# Patient Record
Sex: Male | Born: 1960
Health system: Southern US, Community
[De-identification: ages and names within clinical notes are randomized; demographics above are authoritative.]

## PROBLEM LIST (undated history)

## (undated) DIAGNOSIS — Z8619 Personal history of other infectious and parasitic diseases: Secondary | ICD-10-CM

## (undated) DIAGNOSIS — Z9989 Dependence on other enabling machines and devices: Secondary | ICD-10-CM

## (undated) DIAGNOSIS — T7840XA Allergy, unspecified, initial encounter: Secondary | ICD-10-CM

## (undated) DIAGNOSIS — I10 Essential (primary) hypertension: Secondary | ICD-10-CM

## (undated) DIAGNOSIS — N200 Calculus of kidney: Secondary | ICD-10-CM

## (undated) DIAGNOSIS — G4733 Obstructive sleep apnea (adult) (pediatric): Secondary | ICD-10-CM

## (undated) DIAGNOSIS — E785 Hyperlipidemia, unspecified: Secondary | ICD-10-CM

## (undated) HISTORY — PX: COLONOSCOPY: SHX174

## (undated) HISTORY — DX: Personal history of other infectious and parasitic diseases: Z86.19

## (undated) HISTORY — DX: Dependence on other enabling machines and devices: Z99.89

## (undated) HISTORY — PX: LITHOTRIPSY: SUR834

## (undated) HISTORY — DX: Allergy, unspecified, initial encounter: T78.40XA

## (undated) HISTORY — DX: Calculus of kidney: N20.0

## (undated) HISTORY — DX: Essential (primary) hypertension: I10

## (undated) HISTORY — DX: Obstructive sleep apnea (adult) (pediatric): G47.33

## (undated) HISTORY — DX: Hyperlipidemia, unspecified: E78.5

---

## 2006-04-17 ENCOUNTER — Emergency Department: Payer: Self-pay | Admitting: General Practice

## 2006-04-28 ENCOUNTER — Ambulatory Visit: Payer: Self-pay | Admitting: Urology

## 2006-04-28 ENCOUNTER — Other Ambulatory Visit: Payer: Self-pay

## 2006-08-17 ENCOUNTER — Ambulatory Visit: Payer: Self-pay

## 2006-09-26 ENCOUNTER — Ambulatory Visit: Payer: Self-pay | Admitting: Unknown Physician Specialty

## 2007-08-19 IMAGING — CT CT ABD-PELV W/O CM
1 of 2 series · 15 of 32 positions shown, 19 images · non-contrast
Comparison: none

REASON FOR EXAM: abdominal pain/stones
COMMENTS:  LMP: (Male)

[Series 2: stone · axial · 0.66mm/px · z∈[-490,-73]mm · 15 of 156 slices shown, 19 images]
[im 11/156  soft-tissue]
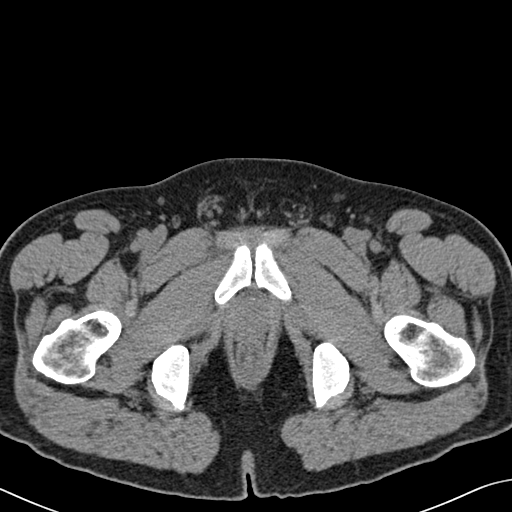
[im 11/156  bone]
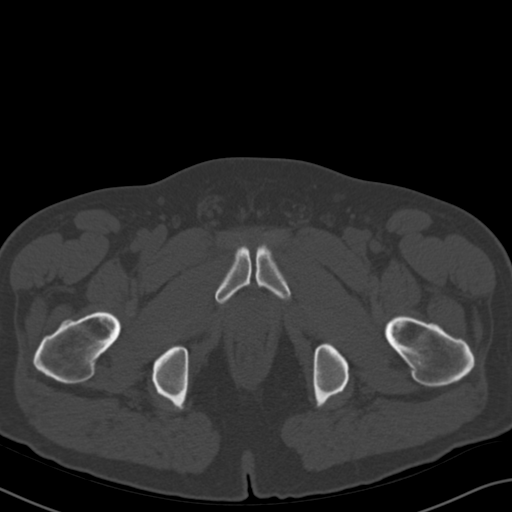
[im 22/156  soft-tissue]
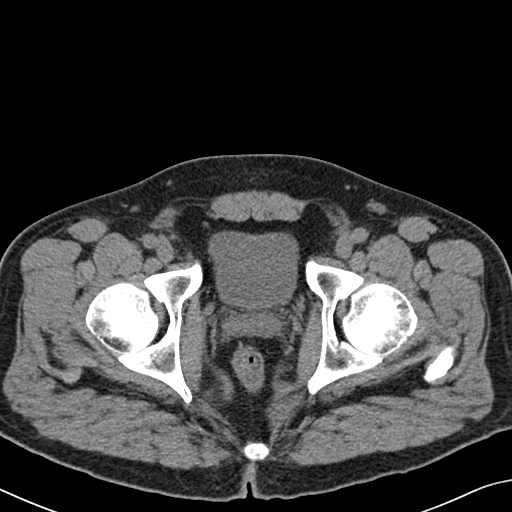
[im 33/156  soft-tissue]
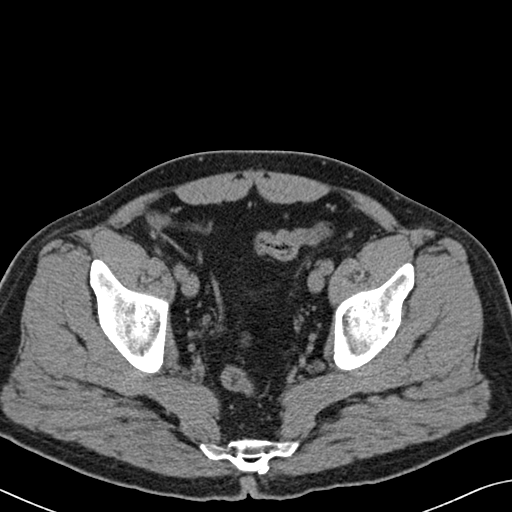
[im 43/156  soft-tissue]
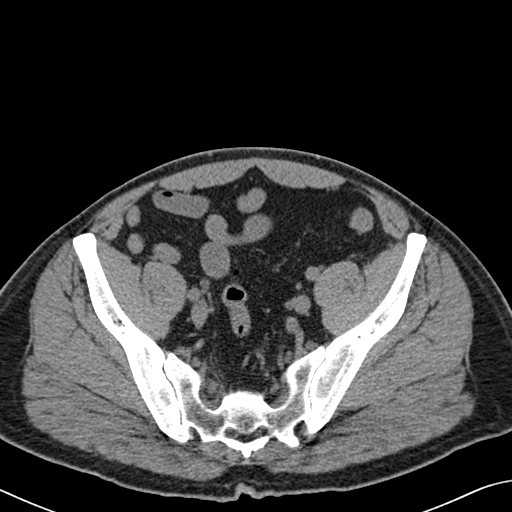
[im 54/156  soft-tissue]
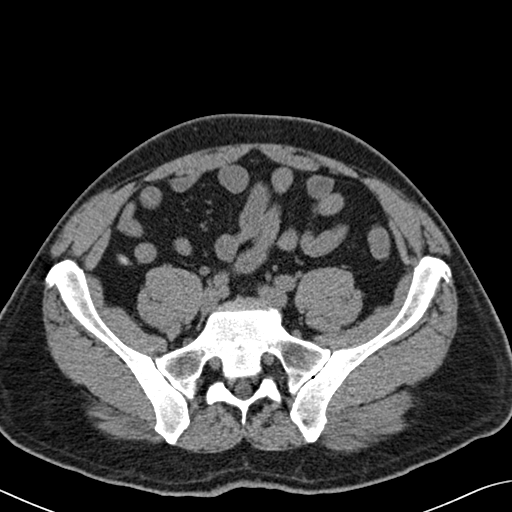
[im 65/156  soft-tissue]
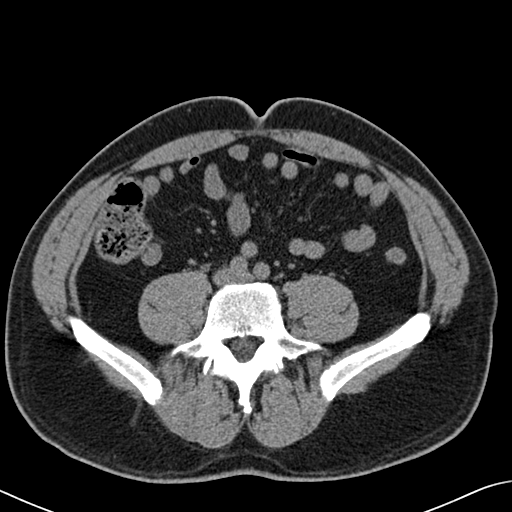
[im 81/156  soft-tissue]
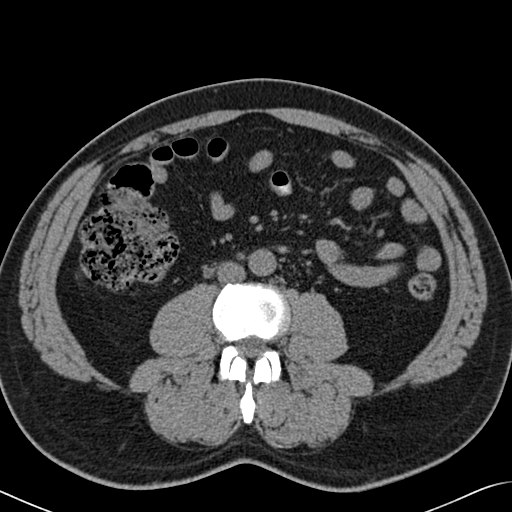
[im 91/156  soft-tissue]
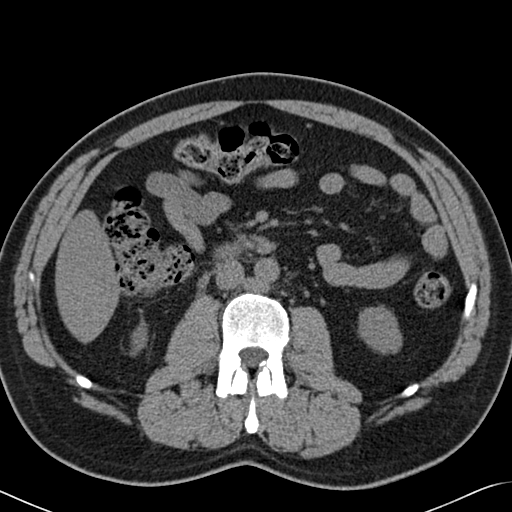
[im 102/156  soft-tissue]
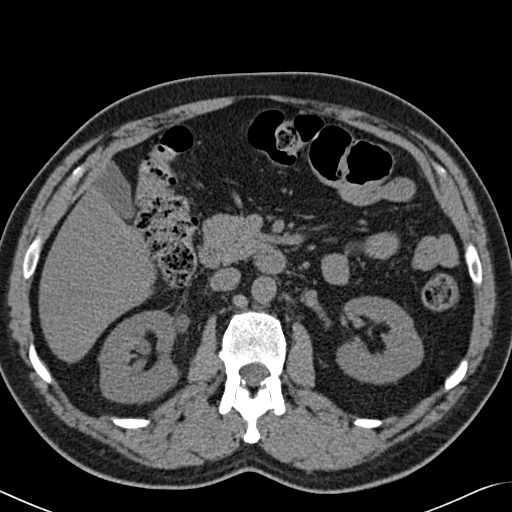
[im 102/156  bone]
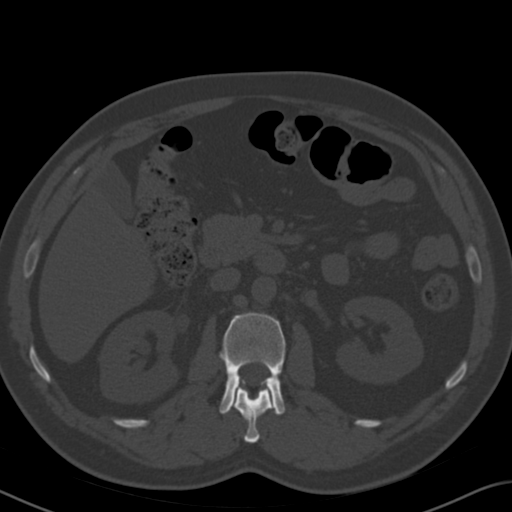
[im 113/156  soft-tissue]
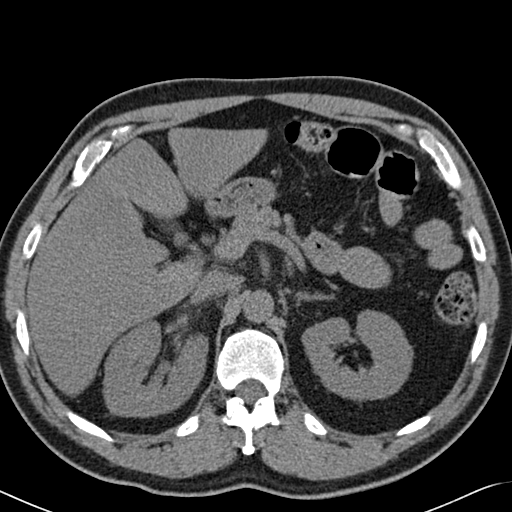
[im 123/156  soft-tissue]
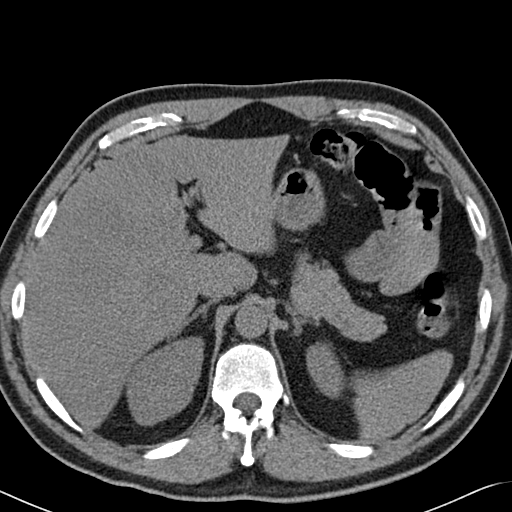
[im 134/156  soft-tissue]
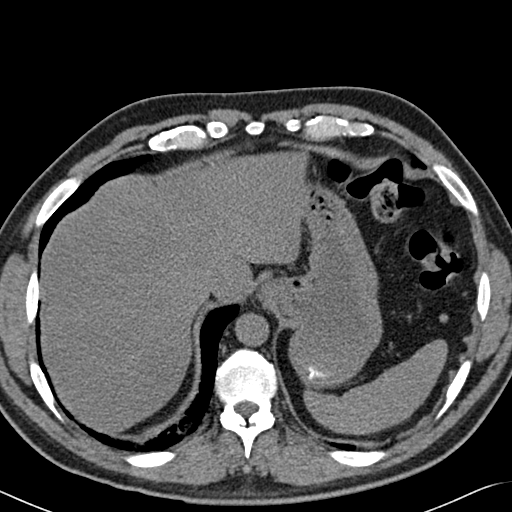
[im 134/156  lung]
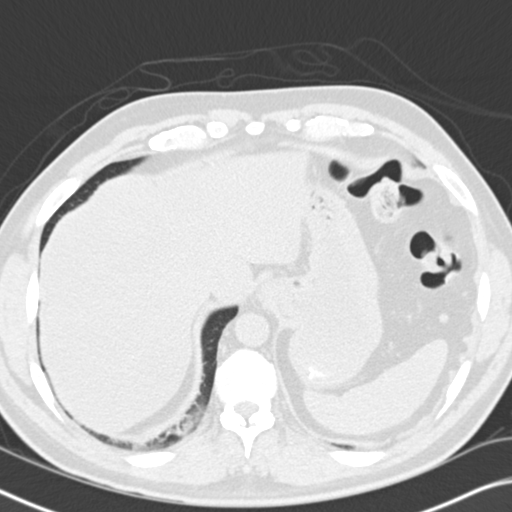
[im 139/156  lung]
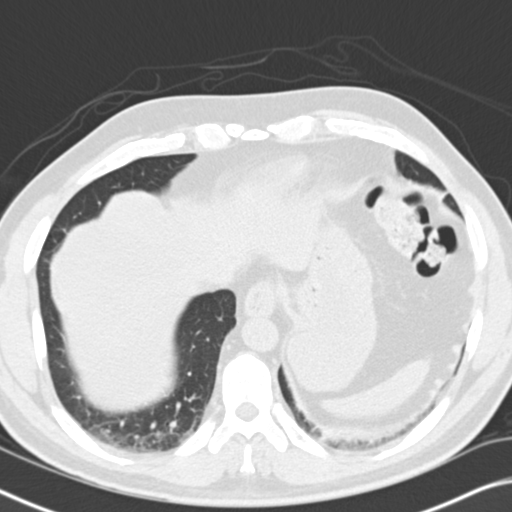
[im 145/156  soft-tissue]
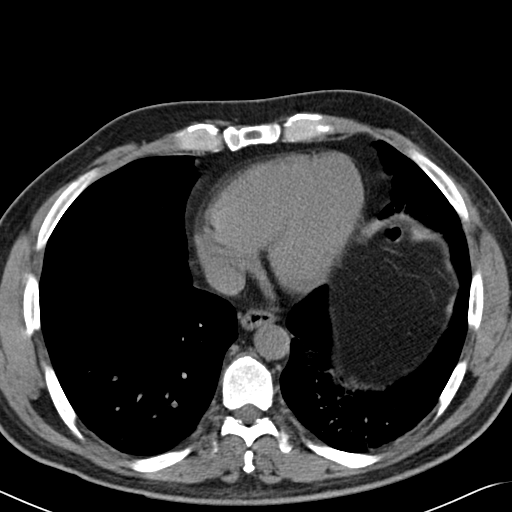
[im 145/156  lung]
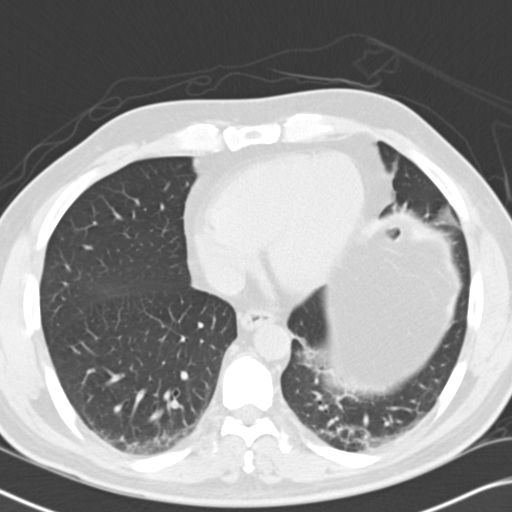
[im 150/156  lung]
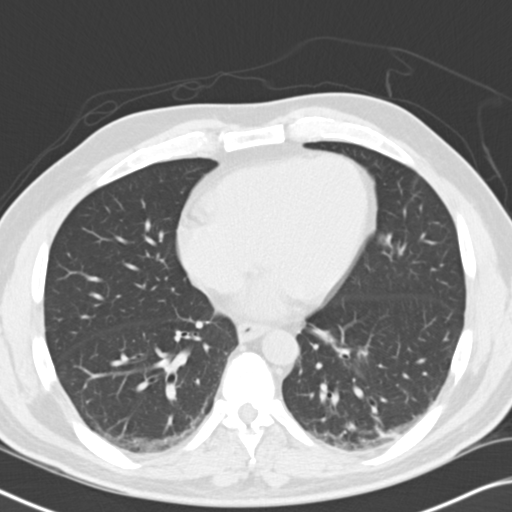

[15 of 32 positions shown; findings below may reference images not displayed]

PROCEDURE:     CT  - CT ABDOMEN AND PELVIS W[DATE] [DATE]

RESULT:          Spiral 3 mm sections were obtained from the lung bases
through the pubic symphysis without the intravenous administration of oral
or intravenous contrast.

Atelectasis versus infiltrate is appreciated within the lung bases, LEFT
greater than RIGHT.

Evaluation of the RIGHT kidney demonstrates mild hydronephrosis as well as
mild hydroureter.  There is minimal inflammatory change within the
periureteral fat.  Within the distal ureter at the level of the acetabulum,
a 4.1 mm calculus is appreciated within the ureter.  This is proximal to the
ureterovesical junction.  Evaluation of the RIGHT kidney otherwise
demonstrates no further masses nor calculi.  Evaluation of the LEFT kidney
demonstrates a nonobstructing calculus within the posterior upper pole
region measuring 4.0 mm in diameter.  There is no evidence of LEFT
hydronephrosis nor hydroureter nor ureterolithiasis.

The liver, spleen, adrenals, and pancreas are unremarkable.  There is no
evidence of abdominal or pelvic free fluid, drainable loculated fluid
collections, masses nor adenopathy.
IMPRESSION: 1.     Distal RIGHT ureteral calculus as described above with associated
mild obstructive uropathy.
2.     Nonobstructing calculus involving the LEFT kidney.
3.     Dr. Noushin, of the emergency department, was informed of these
findings at the time of the initial interpretation.

## 2008-04-13 ENCOUNTER — Ambulatory Visit: Payer: Self-pay | Admitting: Family Medicine

## 2008-04-18 ENCOUNTER — Ambulatory Visit: Payer: Self-pay | Admitting: Urology

## 2009-08-15 IMAGING — CT CT STONE STUDY
1 of 2 series · 15 of 32 positions shown, 19 images · non-contrast
Comparison: none

REASON FOR EXAM: left flank/groin pain
COMMENTS:

[Series 2: stone · axial · 0.73mm/px · z∈[-527,-77]mm · 15 of 164 slices shown, 19 images]
[im 7/164  soft-tissue]
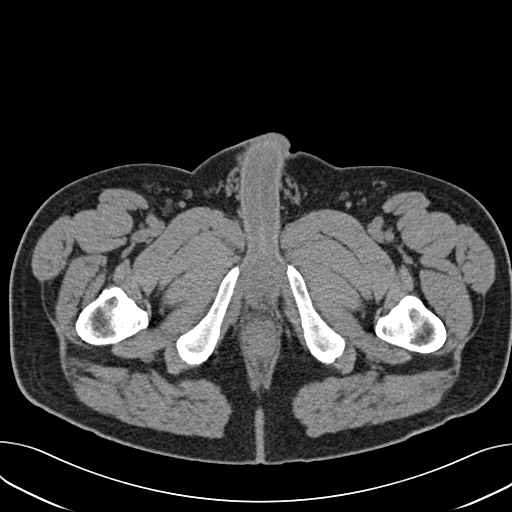
[im 7/164  bone]
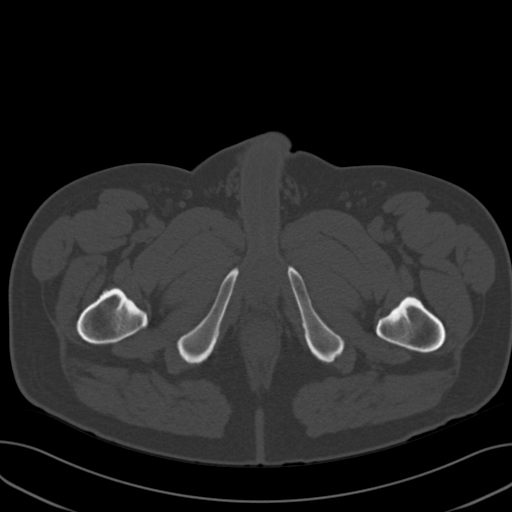
[im 19/164  soft-tissue]
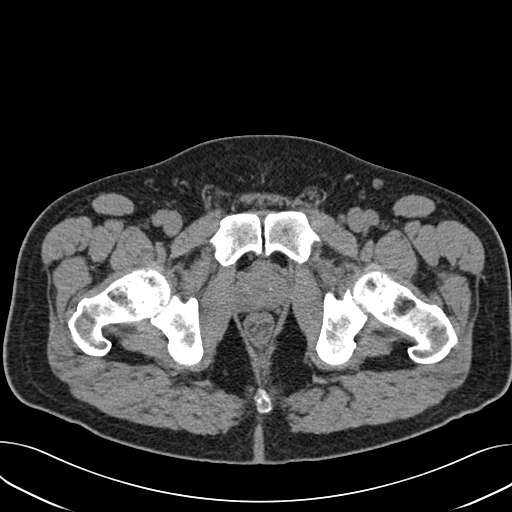
[im 32/164  soft-tissue]
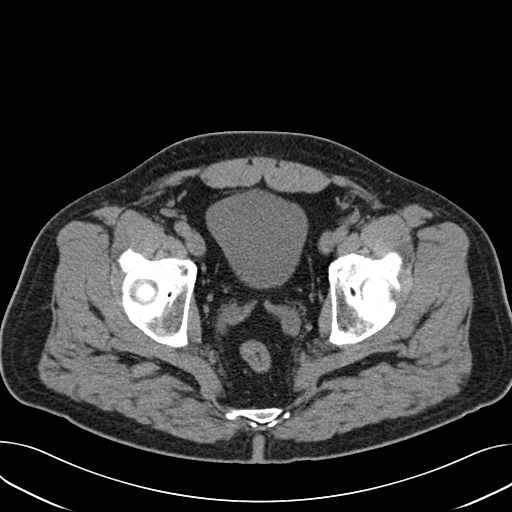
[im 44/164  soft-tissue]
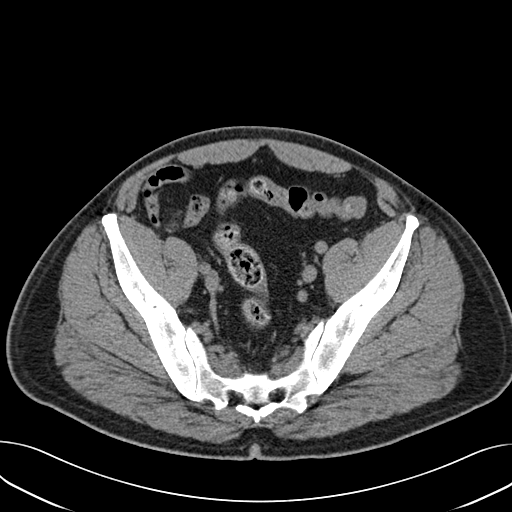
[im 57/164  soft-tissue]
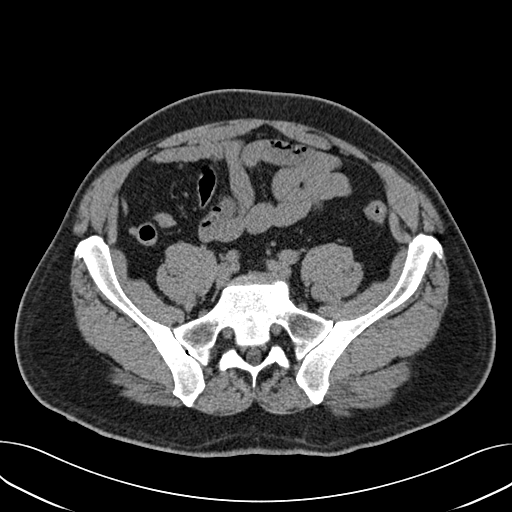
[im 69/164  soft-tissue]
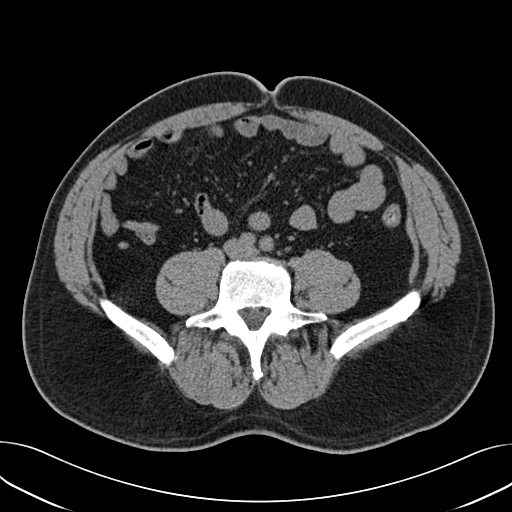
[im 82/164  soft-tissue]
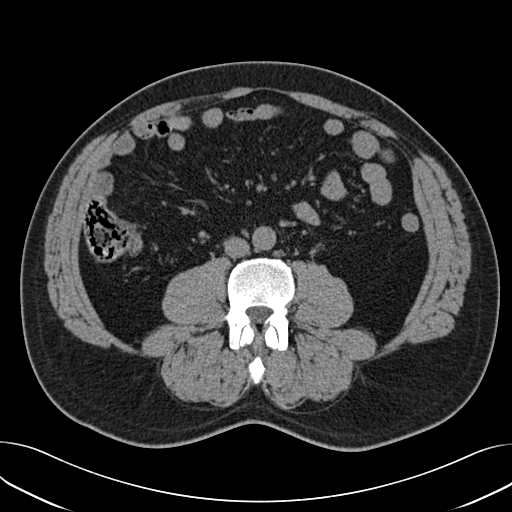
[im 95/164  soft-tissue]
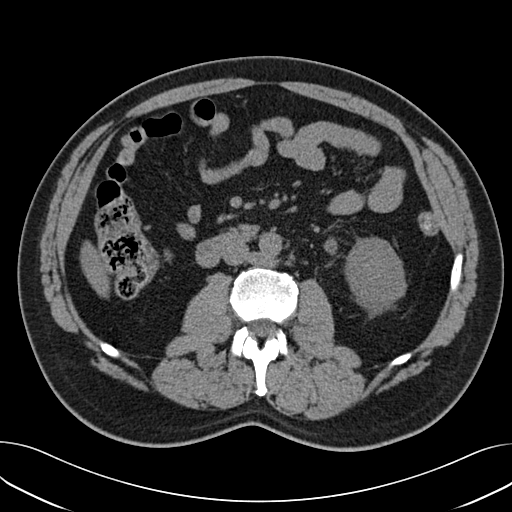
[im 107/164  soft-tissue]
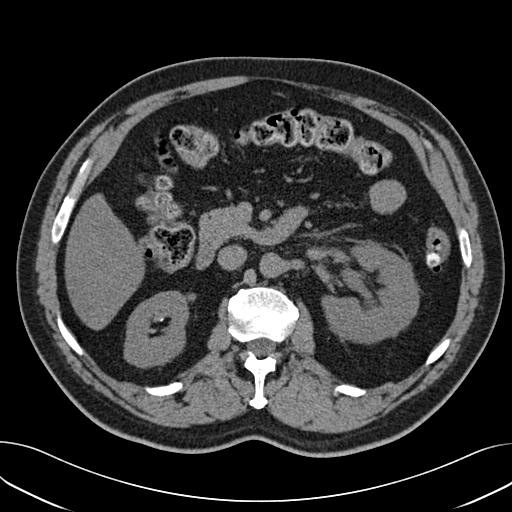
[im 107/164  bone]
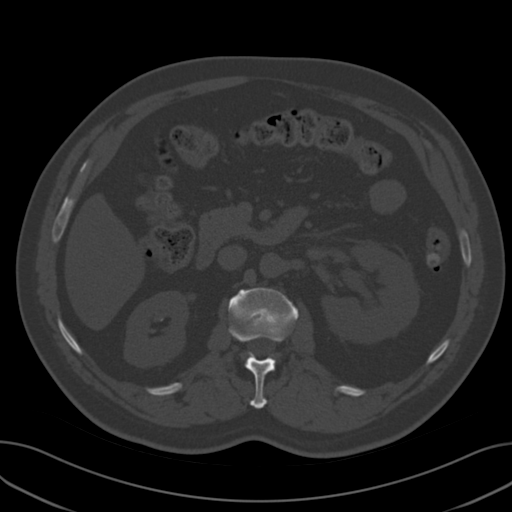
[im 120/164  soft-tissue]
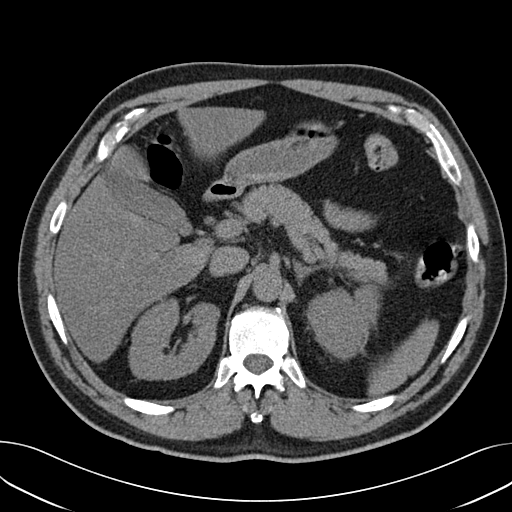
[im 132/164  soft-tissue]
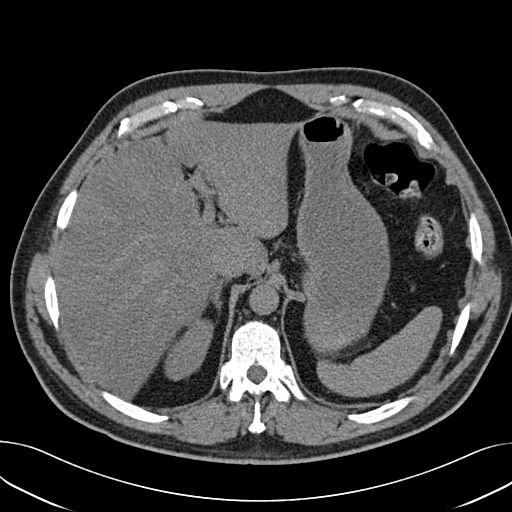
[im 138/164  lung]
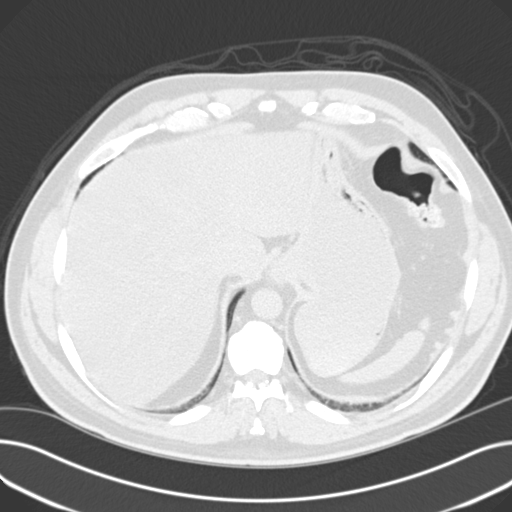
[im 145/164  soft-tissue]
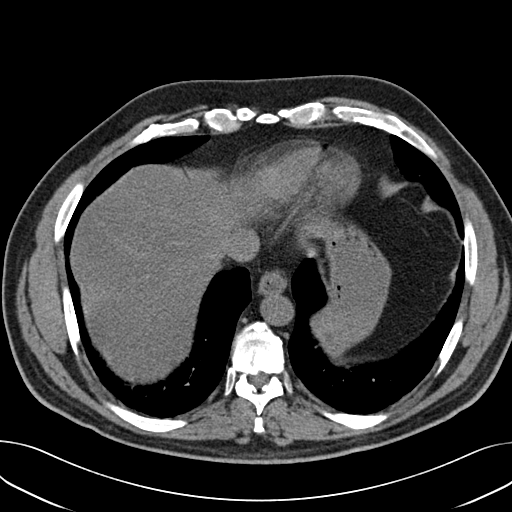
[im 145/164  lung]
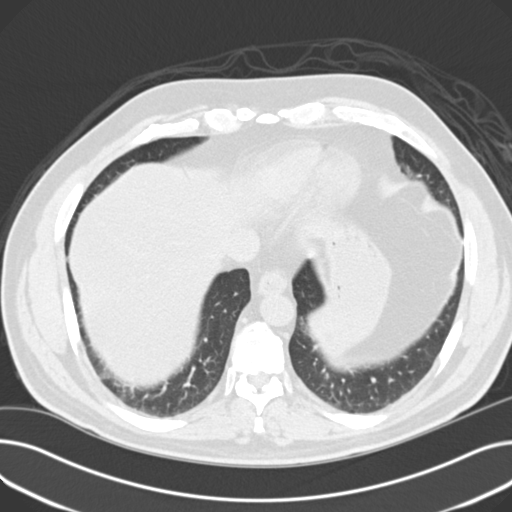
[im 151/164  lung]
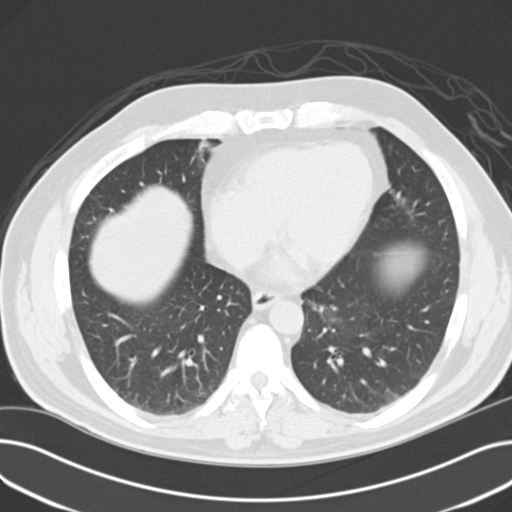
[im 157/164  soft-tissue]
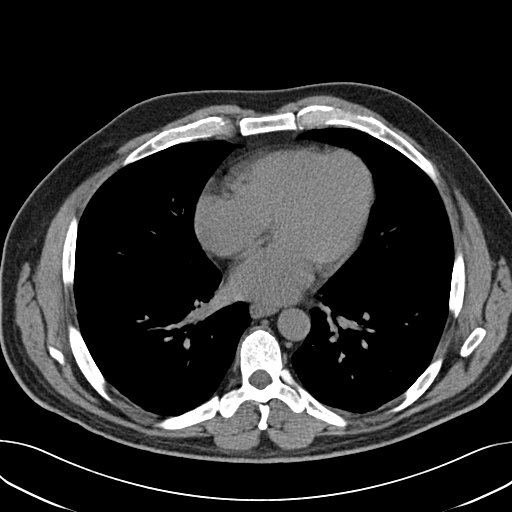
[im 157/164  lung]
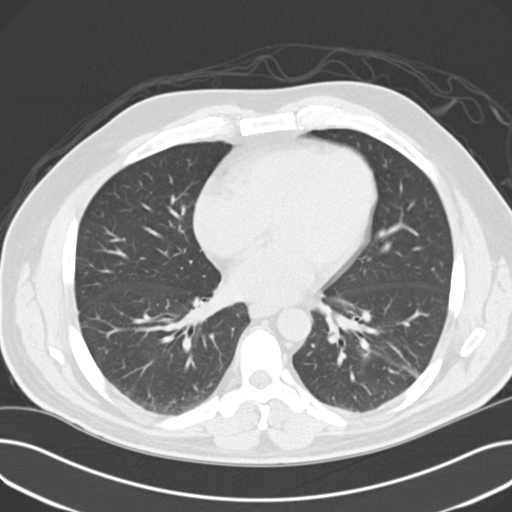

[15 of 32 positions shown; findings below may reference images not displayed]

PROCEDURE:     CT  - CT ABDOMEN /PELVIS WO (STONE)  - April 13, 2008 [DATE]

RESULT:     Noncontrast renal stone protocol CT of the abdomen and pelvis
demonstrates a left-sided stone measuring approximately 6 mm x 4 mm on image
74 in the proximal left ureter with mild to moderate left hydronephrosis and
perinephric stranding. No additional left renal calculi are evident. There
is a tiny stone of 2 mm in the lower pole of the right kidney on image 58.
The previously demonstrated distal right ureteral stone has resolved. The
upper pole stone previously seen on the left is not present. No gallstones
are evident. The abdominal viscera appear to be unremarkable. There is no
adenopathy, free air, free fluid or inflammatory stranding elsewhere. The
urinary bladder is unremarkable. The aorta is normal in caliber with
scattered minimal atherosclerotic calcification.
IMPRESSION: 1. Obstructing proximal left ureteral stone of to 6 mm size. Urologic
followup is recommended.
2. Nonobstructing lower pole 2 mm right renal stone.

## 2009-12-29 ENCOUNTER — Ambulatory Visit: Payer: Self-pay | Admitting: Family Medicine

## 2010-12-15 ENCOUNTER — Emergency Department: Payer: Self-pay | Admitting: Emergency Medicine

## 2011-02-18 ENCOUNTER — Ambulatory Visit: Payer: Self-pay | Admitting: Gastroenterology

## 2015-01-01 ENCOUNTER — Ambulatory Visit: Payer: Self-pay | Admitting: Urology

## 2015-01-02 ENCOUNTER — Ambulatory Visit: Payer: Self-pay | Admitting: Urology

## 2015-04-23 ENCOUNTER — Encounter: Payer: Self-pay | Admitting: Family Medicine

## 2015-04-23 ENCOUNTER — Ambulatory Visit (INDEPENDENT_AMBULATORY_CARE_PROVIDER_SITE_OTHER): Payer: 59 | Admitting: Family Medicine

## 2015-04-23 VITALS — BP 139/90 | HR 75 | Temp 98.3°F | Wt 199.0 lb

## 2015-04-23 DIAGNOSIS — I10 Essential (primary) hypertension: Secondary | ICD-10-CM | POA: Diagnosis not present

## 2015-04-23 DIAGNOSIS — J320 Chronic maxillary sinusitis: Secondary | ICD-10-CM | POA: Diagnosis not present

## 2015-04-23 DIAGNOSIS — T7840XA Allergy, unspecified, initial encounter: Secondary | ICD-10-CM | POA: Insufficient documentation

## 2015-04-23 DIAGNOSIS — N200 Calculus of kidney: Secondary | ICD-10-CM | POA: Insufficient documentation

## 2015-04-23 DIAGNOSIS — E785 Hyperlipidemia, unspecified: Secondary | ICD-10-CM | POA: Insufficient documentation

## 2015-04-23 DIAGNOSIS — G4733 Obstructive sleep apnea (adult) (pediatric): Secondary | ICD-10-CM | POA: Insufficient documentation

## 2015-04-23 DIAGNOSIS — Z9989 Dependence on other enabling machines and devices: Secondary | ICD-10-CM

## 2015-04-23 MED ORDER — AMOXICILLIN-POT CLAVULANATE 875-125 MG PO TABS: 1.0000 | ORAL_TABLET | Freq: Two times a day (BID) | ORAL | Status: DC

## 2015-04-23 NOTE — Patient Instructions (Addendum)
Try turmeric as a natural anti-inflammatory (for pain and arthritis). It comes in capsules where you buy aspirin and fish oil, but also as a spice where you buy pepper and garlic powder. Rest and hydration and vitamin C and green tea Start the antibiotic if you develop fever, foul purulent drainage, and/or get much worse Nasal saline rinses Continue plain Mucinex or Mucinex DM Try to use PLAIN allergy medicine without the decongestant Avoid: phenylephrine, phenylpropanolamine, and pseudoephredine   DASH Eating Plan DASH stands for "Dietary Approaches to Stop Hypertension." The DASH eating plan is a healthy eating plan that has been shown to reduce high blood pressure (hypertension). Additional health benefits may include reducing the risk of type 2 diabetes mellitus, heart disease, and stroke. The DASH eating plan may also help with weight loss. WHAT DO I NEED TO KNOW ABOUT THE DASH EATING PLAN? For the DASH eating plan, you will follow these general guidelines:  Choose foods with a percent daily value for sodium of less than 5% (as listed on the food label).  Use salt-free seasonings or herbs instead of table salt or sea salt.  Check with your health care provider or pharmacist before using salt substitutes.  Eat lower-sodium products, often labeled as "lower sodium" or "no salt added."  Eat fresh foods.  Eat more vegetables, fruits, and low-fat dairy products.  Choose whole grains. Look for the word "whole" as the first word in the ingredient list.  Choose fish and skinless chicken or [place] more often than red meat. Limit fish, poultry, and meat to 6 oz (170 g) each day.  Limit sweets, desserts, sugars, and sugary drinks.  Choose heart-healthy fats.  Limit cheese to 1 oz (28 g) per day.  Eat more home-cooked food and less restaurant, buffet, and fast food.  Limit fried foods.  Cook foods using methods other than frying.  Limit canned vegetables. If you do use them, rinse  them well to decrease the sodium.  When eating at a restaurant, ask that your food be prepared with less salt, or no salt if possible. WHAT FOODS CAN I EAT? Seek help from a dietitian for individual calorie needs. Grains Whole grain or whole wheat bread. Brown rice. Whole grain or whole wheat pasta. Quinoa, bulgur, and whole grain cereals. Low-sodium cereals. Corn or whole wheat flour tortillas. Whole grain cornbread. Whole grain crackers. Low-sodium crackers. Vegetables Fresh or frozen vegetables (raw, steamed, roasted, or grilled). Low-sodium or reduced-sodium tomato and vegetable juices. Low-sodium or reduced-sodium tomato sauce and paste. Low-sodium or reduced-sodium canned vegetables.  Fruits All fresh, canned (in natural juice), or frozen fruits. Meat and Other Protein Products Ground beef (85% or leaner), grass-fed beef, or beef trimmed of fat. Skinless chicken or [place]. Ground chicken or [place]. Pork trimmed of fat. All fish and seafood. Eggs. Dried beans, peas, or lentils. Unsalted nuts and seeds. Unsalted canned beans. Dairy Low-fat dairy products, such as skim or 1% milk, 2% or reduced-fat cheeses, low-fat ricotta or cottage cheese, or plain low-fat yogurt. Low-sodium or reduced-sodium cheeses. Fats and Oils Tub margarines without trans fats. Light or reduced-fat mayonnaise and salad dressings (reduced sodium). Avocado. Safflower, olive, or canola oils. Natural peanut or almond butter. Other Unsalted popcorn and pretzels. The items listed above may not be a complete list of recommended foods or beverages. Contact your dietitian for more options. WHAT FOODS ARE NOT RECOMMENDED? Grains White bread. White pasta. White rice. Refined cornbread. Bagels and croissants. Crackers that contain trans fat. Vegetables Creamed or  fried vegetables. Vegetables in a cheese sauce. Regular canned vegetables. Regular canned tomato sauce and paste. Regular tomato and vegetable juices. Fruits Dried  fruits. Canned fruit in light or heavy syrup. Fruit juice. Meat and Other Protein Products Fatty cuts of meat. Ribs, chicken wings, bacon, sausage, bologna, salami, chitterlings, fatback, hot dogs, bratwurst, and packaged luncheon meats. Salted nuts and seeds. Canned beans with salt. Dairy Whole or 2% milk, cream, half-and-half, and cream cheese. Whole-fat or sweetened yogurt. Full-fat cheeses or blue cheese. Nondairy creamers and whipped toppings. Processed cheese, cheese spreads, or cheese curds. Condiments Onion and garlic salt, seasoned salt, table salt, and sea salt. Canned and packaged gravies. Worcestershire sauce. Tartar sauce. Barbecue sauce. Teriyaki sauce. Soy sauce, including reduced sodium. Steak sauce. Fish sauce. Oyster sauce. Cocktail sauce. Horseradish. Ketchup and mustard. Meat flavorings and tenderizers. Bouillon cubes. Hot sauce. Tabasco sauce. Marinades. Taco seasonings. Relishes. Fats and Oils Butter, stick margarine, lard, shortening, ghee, and bacon fat. Coconut, palm kernel, or palm oils. Regular salad dressings. Other Pickles and olives. Salted popcorn and pretzels. The items listed above may not be a complete list of foods and beverages to avoid. Contact your dietitian for more information. WHERE CAN I FIND MORE INFORMATION? National [employer], [employer], [employer]: travelstabloid.com Document Released: 10/14/2011 Document Revised: 03/11/2014 Document Reviewed: 08/29/2013 [facility] Patient Information 2015 [place], [place]. This information is not intended to replace advice given to you by your health care provider. Make sure you discuss any questions you have with your health care provider.

## 2015-04-23 NOTE — Progress Notes (Signed)
BP 139/90 mmHg  Pulse 75  Temp(Src) 98.3 F (36.8 C)  Wt 199 lb (90.266 kg)  SpO2 99%   Subjective:    Patient ID: Craig Ryan, male    DOB: [birth year], 54 y.o.   MRN: [id number]  HPI: Craig Ryan is a 54 y.o. male  Chief Complaint  Patient presents with  . URI    He started to have symptoms 1 week ago Congested and runny nose The last two nights, if he lays down, then he has constant coughing Very little sleep the last two nights Some of the discharge he has yellowish tint Slight warm temperature, not sure if outright fever though Taking tylenol and mucinex  His wife started with similar symptoms 1-1/2 weeks ago; she saw someone and went to walk-in clinic; was put on antibiotic and she's doing great He is leaving for [place] in the morning He has high blood pressure; he has been taking Mucinex DM altered with Mucinex D  Relevant past medical, surgical, family and social history reviewed and updated as indicated. Interim medical history since our last visit reviewed. Allergies and medications reviewed and updated.  Review of Systems  Constitutional: Negative for fever.  HENT: Positive for congestion, ear pain (almost feels like the right ear has something in there but nothing there; going on for years off and on, when bothered with allergies), postnasal drip, rhinorrhea and sore throat (just a little irritation from drainage). Negative for dental problem, ear discharge, facial swelling, mouth sores, nosebleeds, sinus pressure, tinnitus and trouble swallowing.        Wears mouth guard at night for grinding; no tenderness over the TMJ  Musculoskeletal: Positive for arthralgias (little stiffness in the hands).  Skin: Negative for rash.  Neurological: Positive for headaches (occasionally, nothing out of the ordinary).    Per HPI unless specifically indicated above     Objective:    BP 139/90 mmHg  Pulse 75  Temp(Src) 98.3 F (36.8 C)  Wt 199 lb (90.266  kg)  SpO2 99%  Wt Readings from Last 3 Encounters:  04/23/15 199 lb (90.266 kg)  02/26/15 198 lb (89.812 kg)    Physical Exam  Constitutional: He appears well-developed and well-nourished. No distress.  HENT:  Head: Normocephalic and atraumatic.  Right Ear: External ear normal. No drainage, swelling or tenderness. No mastoid tenderness. Tympanic membrane is not injected, not scarred, not perforated, not erythematous, not retracted and not bulging. No middle ear effusion. No hemotympanum. No decreased hearing is noted.  Left Ear: External ear normal. No drainage, swelling or tenderness. No foreign bodies. No mastoid tenderness. Tympanic membrane is not injected, not scarred, not perforated, not erythematous, not retracted and not bulging.  No middle ear effusion. No hemotympanum. No decreased hearing is noted.  Nose: Sinus tenderness (over the right maxillary paranasal sinus region) present. No mucosal edema, rhinorrhea, nasal deformity or septal deviation. Right sinus exhibits maxillary sinus tenderness. Right sinus exhibits no frontal sinus tenderness. Left sinus exhibits no maxillary sinus tenderness and no frontal sinus tenderness.  Mouth/Throat: Mucous membranes are normal.  No tenderness or subluxation of the TMJ joints; scant cerumen with fibers deep in the canals, tiny and not obscuring visualization of the TMs  Eyes: EOM are normal. Right eye exhibits no discharge. Left eye exhibits no discharge. Right conjunctiva is not injected. Left conjunctiva is not injected. No scleral icterus.  Neck: No JVD present.  Cardiovascular: Normal rate, regular rhythm and normal heart sounds.   No  murmur heard. Pulmonary/Chest: Effort normal. No respiratory distress.  Lymphadenopathy:       Head (right side): No submental, no submandibular, no preauricular and no posterior auricular adenopathy present.       Head (left side): No submental, no submandibular, no preauricular and no posterior auricular  adenopathy present.    He has no cervical adenopathy.  Neurological: He is alert. He displays no tremor.  Skin: Skin is warm and dry. No rash noted. He is not diaphoretic. No cyanosis.  Psychiatric: He has a normal mood and affect. His behavior is normal. Judgment and thought content normal.  Nursing note and vitals reviewed.   No results found for this or any previous visit.    Assessment & Plan:   Problem List Items Addressed This Visit      Cardiovascular and Mediastinum   Hypertension    Not quite to goal; suggested that he NOT use the Mucinex D and instead use either plain or Mucinex DM; avoid decongestants; recommended the DASH guidelines, hand-out given (see wrap-up); he will see his primary in a few months for follow-up; if he can adhere to DASH guidelines, may be able to reduce current medicine, talk with primary      Relevant Medications   metoprolol succinate (TOPROL-XL) 50 MG 24 hr tablet    Other Visit Diagnoses    Right maxillary sinusitis    -  Primary    advised early, may not need antibiotics; discussed risk of C diff, trial of rest, hydration, vit C, green tea, nasal saline spray; Rx to start if needed    Relevant Medications    amoxicillin-clavulanate (AUGMENTIN) 875-125 MG per tablet     (he was told by his primary he is getting older, may have some arthritis; I suggested turmeric for joints; just mentioned in passing)  Follow up plan: Return if symptoms worsen or fail to improve.

## 2015-04-23 NOTE — Assessment & Plan Note (Signed)
Not quite to goal; suggested that he NOT use the Mucinex D and instead use either plain or Mucinex DM; avoid decongestants; recommended the DASH guidelines, hand-out given (see wrap-up); he will see his primary in a few months for follow-up; if he can adhere to DASH guidelines, may be able to reduce current medicine, talk with primary

## 2015-07-29 ENCOUNTER — Other Ambulatory Visit: Payer: Self-pay | Admitting: Family Medicine

## 2015-08-25 ENCOUNTER — Encounter: Payer: Self-pay | Admitting: Family Medicine

## 2015-08-25 ENCOUNTER — Ambulatory Visit (INDEPENDENT_AMBULATORY_CARE_PROVIDER_SITE_OTHER): Payer: Commercial Managed Care - HMO | Admitting: Family Medicine

## 2015-08-25 VITALS — BP 131/85 | HR 67 | Temp 99.0°F | Ht 68.8 in | Wt 204.0 lb

## 2015-08-25 DIAGNOSIS — G4733 Obstructive sleep apnea (adult) (pediatric): Secondary | ICD-10-CM | POA: Diagnosis not present

## 2015-08-25 DIAGNOSIS — I10 Essential (primary) hypertension: Secondary | ICD-10-CM

## 2015-08-25 DIAGNOSIS — E785 Hyperlipidemia, unspecified: Secondary | ICD-10-CM | POA: Diagnosis not present

## 2015-08-25 DIAGNOSIS — Z9989 Dependence on other enabling machines and devices: Secondary | ICD-10-CM

## 2015-08-25 DIAGNOSIS — N200 Calculus of kidney: Secondary | ICD-10-CM

## 2015-08-25 LAB — LP+ALT+AST PICCOLO, WAIVED
ALT (SGPT) PICCOLO, WAIVED: 35 U/L (ref 10–47)
AST (SGOT) Piccolo, Waived: 33 U/L (ref 11–38)
Chol/HDL Ratio Piccolo,Waive: 3 mg/dL
Cholesterol Piccolo, Waived: 159 mg/dL (ref <200)
HDL Chol Piccolo, Waived: 54 mg/dL — ABNORMAL LOW (ref >59)
LDL CHOL CALC PICCOLO WAIVED: 87 mg/dL (ref <100)
Triglycerides Piccolo,Waived: 92 mg/dL (ref <150)
VLDL CHOL CALC PICCOLO,WAIVE: 18 mg/dL (ref <30)

## 2015-08-25 NOTE — Progress Notes (Signed)
   BP 131/85 mmHg  Pulse 67  Temp(Src) 99 F (37.2 C)  Ht 5' 8.8" (1.748 m)  Wt 204 lb (92.534 kg)  BMI 30.28 kg/m2  SpO2 99%   Subjective:    Patient ID: Craig Ryan, male    DOB: 11-Nov-1960, 54 y.o.   MRN: [id number]  HPI: Craig Ryan is a 54 y.o. male  Chief Complaint  Patient presents with  . Hyperlipidemia  . Hypertension   patient follow-up hypertension doing well no complaints from medications taken faithfully blood pressure doing well. Patient's cholesterol doing well no complaints from Lipitor taking faithfully no side effects Has passed some kidney stones hasn't had any symptoms since otherwise doing well trying to stay hydrated to prevent Kidney stone.  Relevant past medical, surgical, family and social history reviewed and updated as indicated. Interim medical history since our last visit reviewed. Allergies and medications reviewed and updated.  Review of Systems  Constitutional: Negative.   Respiratory: Negative.   Cardiovascular: Negative.     Per HPI unless specifically indicated above     Objective:    BP 131/85 mmHg  Pulse 67  Temp(Src) 99 F (37.2 C)  Ht 5' 8.8" (1.748 m)  Wt 204 lb (92.534 kg)  BMI 30.28 kg/m2  SpO2 99%  Wt Readings from Last 3 Encounters:  08/25/15 204 lb (92.534 kg)  04/23/15 199 lb (90.266 kg)  02/26/15 198 lb (89.812 kg)    Physical Exam  Constitutional: He is oriented to person, place, and time. He appears well-developed and well-nourished. No distress.  HENT:  Head: Normocephalic and atraumatic.  Right Ear: Hearing normal.  Left Ear: Hearing normal.  Nose: Nose normal.  Eyes: Conjunctivae and lids are normal. Right eye exhibits no discharge. Left eye exhibits no discharge. No scleral icterus.  Cardiovascular: Normal rate, regular rhythm and normal heart sounds.   Pulmonary/Chest: Effort normal and breath sounds normal. No respiratory distress.  Musculoskeletal: Normal range of motion.  Neurological:  He is alert and oriented to person, place, and time.  Skin: Skin is intact. No rash noted.  Psychiatric: He has a normal mood and affect. His speech is normal and behavior is normal. Judgment and thought content normal. Cognition and memory are normal.    No results found for this or any previous visit.    Assessment & Plan:   Problem List Items Addressed This Visit      Cardiovascular and Mediastinum   Hypertension    The current medical regimen is effective;  continue present plan and medications.         Respiratory   OSA on CPAP    New prescription given for supplies        Genitourinary   Nephrolithiasis    stable        Other   Hyperlipidemia    The current medical regimen is effective;  continue present plan and medications.        Other Visit Diagnoses    Essential hypertension, benign    -  Primary    Relevant Orders    LP+ALT+AST Piccolo, Waived    Basic metabolic panel    Hyperlipemia        Relevant Orders    LP+ALT+AST Piccolo, Waived    Basic metabolic panel        Follow up plan: Return in about 6 months (around 02/23/2016), or if symptoms worsen or fail to improve, for Physical Exam.

## 2015-08-25 NOTE — Assessment & Plan Note (Signed)
The current medical regimen is effective;  continue present plan and medications.  

## 2015-08-25 NOTE — Assessment & Plan Note (Signed)
stable °

## 2015-08-25 NOTE — Assessment & Plan Note (Signed)
New prescription given for supplies

## 2015-08-26 ENCOUNTER — Encounter: Payer: Self-pay | Admitting: Family Medicine

## 2015-08-26 LAB — BASIC METABOLIC PANEL
BUN/Creatinine Ratio: 14 (ref 9–20)
BUN: 15 mg/dL (ref 6–24)
CALCIUM: 9 mg/dL (ref 8.7–10.2)
CHLORIDE: 103 mmol/L (ref 97–106)
CO2: 24 mmol/L (ref 18–29)
Creatinine, Ser: 1.06 mg/dL (ref 0.76–1.27)
GFR calc Af Amer: 92 mL/min/{1.73_m2} (ref >59)
GFR calc non Af Amer: 79 mL/min/{1.73_m2} (ref >59)
Glucose: 98 mg/dL (ref 65–99)
POTASSIUM: 4.2 mmol/L (ref 3.5–5.2)
SODIUM: 142 mmol/L (ref 136–144)

## 2015-10-14 ENCOUNTER — Other Ambulatory Visit: Payer: Self-pay | Admitting: Family Medicine

## 2015-10-15 ENCOUNTER — Encounter: Payer: Self-pay | Admitting: *Deleted

## 2015-10-16 ENCOUNTER — Encounter: Payer: Self-pay | Admitting: *Deleted

## 2015-10-16 ENCOUNTER — Ambulatory Visit
Admission: RE | Admit: 2015-10-16 | Discharge: 2015-10-16 | Disposition: A | Discharge status: Home / Self Care | Payer: Commercial Managed Care - HMO | Source: Ambulatory Visit | Attending: Urology | Admitting: Urology

## 2015-10-16 ENCOUNTER — Encounter
Admission: RE | Disposition: A | Discharge status: Home / Self Care | Payer: Self-pay | Source: Ambulatory Visit | Attending: Urology

## 2015-10-16 DIAGNOSIS — N200 Calculus of kidney: Secondary | ICD-10-CM

## 2015-10-16 DIAGNOSIS — N201 Calculus of ureter: Secondary | ICD-10-CM

## 2015-10-16 DIAGNOSIS — Z79899 Other long term (current) drug therapy: Secondary | ICD-10-CM | POA: Diagnosis not present

## 2015-10-16 DIAGNOSIS — I10 Essential (primary) hypertension: Secondary | ICD-10-CM | POA: Diagnosis not present

## 2015-10-16 DIAGNOSIS — E7800 Pure hypercholesterolemia, unspecified: Secondary | ICD-10-CM | POA: Insufficient documentation

## 2015-10-16 DIAGNOSIS — G4730 Sleep apnea, unspecified: Secondary | ICD-10-CM | POA: Insufficient documentation

## 2015-10-16 DIAGNOSIS — Z8249 Family history of ischemic heart disease and other diseases of the circulatory system: Secondary | ICD-10-CM | POA: Diagnosis not present

## 2015-10-16 DIAGNOSIS — Z7901 Long term (current) use of anticoagulants: Secondary | ICD-10-CM | POA: Diagnosis not present

## 2015-10-16 HISTORY — PX: EXTRACORPOREAL SHOCK WAVE LITHOTRIPSY: SHX1557

## 2015-10-16 SURGERY — LITHOTRIPSY, ESWL
Anesthesia: Moderate Sedation | Laterality: Left

## 2015-10-16 MED ORDER — ONDANSETRON 8 MG PO TBDP: 8.0000 mg | ORAL_TABLET | Freq: Four times a day (QID) | ORAL | Status: DC | PRN

## 2015-10-16 MED ORDER — MIDAZOLAM HCL 2 MG/2ML IJ SOLN
1.0000 mg | Freq: Once | INTRAMUSCULAR | Status: AC
  Administered 2015-10-16: 1 mg via INTRAMUSCULAR

## 2015-10-16 MED ORDER — FUROSEMIDE 10 MG/ML IJ SOLN
INTRAMUSCULAR | Status: AC
  Administered 2015-10-16: 10 mg via INTRAVENOUS
  Filled 2015-10-16: qty 2

## 2015-10-16 MED ORDER — MIDAZOLAM HCL 2 MG/2ML IJ SOLN
INTRAMUSCULAR | Status: AC
  Administered 2015-10-16: 1 mg via INTRAMUSCULAR
  Filled 2015-10-16: qty 2

## 2015-10-16 MED ORDER — DIPHENHYDRAMINE HCL 25 MG PO CAPS
25.0000 mg | ORAL_CAPSULE | ORAL | Status: AC
  Administered 2015-10-16: 25 mg via ORAL

## 2015-10-16 MED ORDER — LEVOFLOXACIN 500 MG PO TABS
ORAL_TABLET | ORAL | Status: AC
  Administered 2015-10-16: 500 mg via ORAL
  Filled 2015-10-16: qty 1

## 2015-10-16 MED ORDER — DOCUSATE SODIUM 100 MG PO CAPS: 200.0000 mg | ORAL_CAPSULE | Freq: Two times a day (BID) | ORAL | Status: DC

## 2015-10-16 MED ORDER — DEXTROSE-NACL 5-0.45 % IV SOLN
INTRAVENOUS | Status: DC
  Administered 2015-10-16: 11:00:00 via INTRAVENOUS

## 2015-10-16 MED ORDER — MORPHINE SULFATE (PF) 10 MG/ML IV SOLN
INTRAVENOUS | Status: AC
  Administered 2015-10-16: 10 mg via INTRAMUSCULAR
  Filled 2015-10-16: qty 1

## 2015-10-16 MED ORDER — FUROSEMIDE 10 MG/ML IJ SOLN
10.0000 mg | Freq: Once | INTRAMUSCULAR | Status: AC
  Administered 2015-10-16: 10 mg via INTRAVENOUS

## 2015-10-16 MED ORDER — LEVOFLOXACIN 500 MG PO TABS
500.0000 mg | ORAL_TABLET | ORAL | Status: AC
  Administered 2015-10-16: 500 mg via ORAL

## 2015-10-16 MED ORDER — PROMETHAZINE HCL 25 MG/ML IJ SOLN
INTRAMUSCULAR | Status: AC
  Administered 2015-10-16: 25 mg via INTRAVENOUS
  Filled 2015-10-16: qty 1

## 2015-10-16 MED ORDER — PROMETHAZINE HCL 25 MG/ML IJ SOLN
25.0000 mg | Freq: Once | INTRAMUSCULAR | Status: AC
  Administered 2015-10-16: 25 mg via INTRAVENOUS

## 2015-10-16 MED ORDER — NUCYNTA 50 MG PO TABS: 50.0000 mg | ORAL_TABLET | Freq: Four times a day (QID) | ORAL | Status: DC | PRN

## 2015-10-16 MED ORDER — TAMSULOSIN HCL 0.4 MG PO CAPS: 0.4000 mg | ORAL_CAPSULE | Freq: Every day | ORAL | Status: DC

## 2015-10-16 MED ORDER — LEVOFLOXACIN 500 MG PO TABS: 500.0000 mg | ORAL_TABLET | Freq: Every day | ORAL | Status: DC

## 2015-10-16 MED ORDER — MORPHINE SULFATE (PF) 10 MG/ML IV SOLN
10.0000 mg | Freq: Once | INTRAVENOUS | Status: AC
Start: 2015-10-16 — End: 2015-10-16
  Administered 2015-10-16: 10 mg via INTRAMUSCULAR

## 2015-10-16 MED ORDER — DIPHENHYDRAMINE HCL 25 MG PO CAPS
ORAL_CAPSULE | ORAL | Status: AC
  Administered 2015-10-16: 25 mg via ORAL
  Filled 2015-10-16: qty 1

## 2015-10-16 NOTE — Discharge Instructions (Signed)
Dietary Guidelines to Help Prevent Kidney Stones °Your risk of kidney stones can be decreased by adjusting the foods you eat. The most important thing you can do is drink enough fluid. You should drink enough fluid to keep your urine clear or pale yellow. The following guidelines provide specific information for the type of kidney stone you have had. °GUIDELINES ACCORDING TO TYPE OF KIDNEY STONE °Calcium Oxalate Kidney Stones °· Reduce the amount of salt you eat. Foods that have a lot of salt cause your body to release excess calcium into your urine. The excess calcium can combine with a substance called oxalate to form kidney stones. °· Reduce the amount of animal protein you eat if the amount you eat is excessive. Animal protein causes your body to release excess calcium into your urine. Ask your dietitian how much protein from animal sources you should be eating. °· Avoid foods that are high in oxalates. If you take vitamins, they should have less than 500 mg of vitamin C. Your body turns vitamin C into oxalates. You do not need to avoid fruits and vegetables high in vitamin C. °Calcium Phosphate Kidney Stones °· Reduce the amount of salt you eat to help prevent the release of excess calcium into your urine. °· Reduce the amount of animal protein you eat if the amount you eat is excessive. Animal protein causes your body to release excess calcium into your urine. Ask your dietitian how much protein from animal sources you should be eating. °· Get enough calcium from food or take a calcium supplement (ask your dietitian for recommendations). Food sources of calcium that do not increase your risk of kidney stones include: °· Broccoli. °· Dairy products, such as cheese and yogurt. °· Pudding. °Uric Acid Kidney Stones °· Do not have more than 6 oz of animal protein per day. °FOOD SOURCES °Animal Protein Sources °· Meat (all types). °· Poultry. °· Eggs. °· Fish, seafood. °Foods High in Salt °· Salt seasonings. °· Soy  sauce. °· Teriyaki sauce. °· Cured and processed meats. °· Salted crackers and snack foods. °· Fast food. °· Canned soups and most canned foods. °Foods High in Oxalates °· Grains: °· Amaranth. °· Barley. °· Grits. °· Wheat germ. °· Bran. °· Buckwheat flour. °· All bran cereals. °· Pretzels. °· Whole wheat bread. °· Vegetables: °· Beans (wax). °· Beets and beet greens. °· Collard greens. °· Eggplant. °· Escarole. °· Leeks. °· Okra. °· Parsley. °· Rutabagas. °· Spinach. °· Swiss chard. °· Tomato paste. °· Fried potatoes. °· Sweet potatoes. °· Fruits: °· Red currants. °· Figs. °· Kiwi. °· Rhubarb. °· Meat and Other Protein Sources: °· Beans (dried). °· Soy burgers and other soybean products. °· Miso. °· Nuts (peanuts, almonds, pecans, cashews, hazelnuts). °· Nut butters. °· Sesame seeds and tahini (paste made of sesame seeds). °· Poppy seeds. °· Beverages: °· Chocolate drink mixes. °· Soy milk. °· Instant iced tea. °· Juices made from high-oxalate fruits or vegetables. °· Other: °· Carob. °· Chocolate. °· Fruitcake. °· Marmalades. °  °This information is not intended to replace advice given to you by your health care provider. Make sure you discuss any questions you have with your health care provider. °  °Document Released: 02/19/2011 Document Revised: 10/30/2013 Document Reviewed: 09/21/2013 °Elsevier Interactive Patient Education ©2016 [employer] ° °Kidney Stones °Kidney stones (urolithiasis) are deposits that form inside your kidneys. The intense pain is caused by the stone moving through the urinary tract. When the stone moves, the ureter   goes into spasm around the stone. The stone is usually passed in the urine.  °CAUSES  °· A disorder that makes certain neck glands produce too much parathyroid hormone (primary hyperparathyroidism). °· A buildup of uric acid crystals, similar to gout in your joints. °· Narrowing (stricture) of the ureter. °· A kidney obstruction present at birth (congenital  obstruction). °· Previous surgery on the kidney or ureters. °· Numerous kidney infections. °SYMPTOMS  °· Feeling sick to your stomach (nauseous). °· Throwing up (vomiting). °· Blood in the urine (hematuria). °· Pain that usually spreads (radiates) to the groin. °· Frequency or urgency of urination. °DIAGNOSIS  °· Taking a history and physical exam. °· Blood or urine tests. °· CT scan. °· Occasionally, an examination of the inside of the urinary bladder (cystoscopy) is performed. °TREATMENT  °· Observation. °· Increasing your fluid intake. °· Extracorporeal shock wave lithotripsy--This is a noninvasive procedure that uses shock waves to break up kidney stones. °· Surgery may be needed if you have severe pain or persistent obstruction. There are various surgical procedures. Most of the procedures are performed with the use of small instruments. Only small incisions are needed to accommodate these instruments, so recovery time is minimized. °The size, location, and chemical composition are all important variables that will determine the proper choice of action for you. Talk to your health care provider to better understand your situation so that you will minimize the risk of injury to yourself and your kidney.  °HOME CARE INSTRUCTIONS  °· Drink enough water and fluids to keep your urine clear or pale yellow. This will help you to pass the stone or stone fragments. °· Strain all urine through the provided strainer. Keep all particulate matter and stones for your health care provider to see. The stone causing the pain may be as small as a grain of salt. It is very important to use the strainer each and every time you pass your urine. The collection of your stone will allow your health care provider to analyze it and verify that a stone has actually passed. The stone analysis will often identify what you can do to reduce the incidence of recurrences. °· Only take over-the-counter or prescription medicines for pain,  discomfort, or fever as directed by your health care provider. °· Keep all follow-up visits as told by your health care provider. This is important. °· Get follow-up X-rays if required. The absence of pain does not always mean that the stone has passed. It may have only stopped moving. If the urine remains completely obstructed, it can cause loss of kidney function or even complete destruction of the kidney. It is your responsibility to make sure X-rays and follow-ups are completed. Ultrasounds of the kidney can show blockages and the status of the kidney. Ultrasounds are not associated with any radiation and can be performed easily in a matter of minutes. °· Make changes to your daily diet as told by your health care provider. You may be told to: °· Limit the amount of salt that you eat. °· Eat 5 or more servings of fruits and vegetables each day. °· Limit the amount of meat, poultry, fish, and eggs that you eat. °· Collect a 24-hour urine sample as told by your health care provider. You may need to collect another urine sample every 6-12 months. °SEEK MEDICAL CARE IF: °· You experience pain that is progressive and unresponsive to any pain medicine you have been prescribed. °SEEK IMMEDIATE MEDICAL CARE IF:  °· Pain   cannot be controlled with the prescribed medicine. °· You have a fever or shaking chills. °· The severity or intensity of pain increases over 18 hours and is not relieved by pain medicine. °· You develop a new onset of abdominal pain. °· You feel faint or pass out. °· You are unable to urinate. °  °This information is not intended to replace advice given to you by your health care provider. Make sure you discuss any questions you have with your health care provider. °  °Document Released: 10/25/2005 Document Revised: 07/16/2015 Document Reviewed: 03/28/2013 °Elsevier Interactive Patient Education ©2016 Else[employer] Inc[employer] ° °Lithotripsy, Care After °Refer to this sheet in the next few weeks. These instructions  provide you with information on caring for yourself after your procedure. Your health care provider may also give you more specific instructions. Your treatment has been planned according to current medical practices, but problems sometimes occur. Call your health care provider if you have any problems or questions after your procedure. °WHAT TO EXPECT AFTER THE PROCEDURE  °· Your urine may have a red tinge for a few days after treatment. Blood loss is usually minimal. °· You may have soreness in the back or flank area. This usually goes away after a few days. The procedure can cause blotches or bruises on the back where the pressure wave enters the skin. These marks usually cause only minimal discomfort and should disappear in a short time. °· Stone fragments should begin to pass within 24 hours of treatment. However, a delayed passage is not unusual. °· You may have pain, discomfort, and feel sick to your stomach (nauseated) when the crushed fragments of stone are passed down the tube from the kidney to the bladder. Stone fragments can pass soon after the procedure and may last for up to 4-8 weeks. °· A small number of patients may have severe pain when stone fragments are not able to pass, which leads to an obstruction. °· If your stone is greater than 1 inch (2.5 cm) in diameter or if you have multiple stones that have a combined diameter greater than 1 inch (2.5 cm), you may require more than one treatment. °· If you had a stent placed prior to your procedure, you may experience some discomfort, especially during urination. You may experience the pain or discomfort in your flank or back, or you may experience a sharp pain or discomfort at the base of your penis or in your lower abdomen. The discomfort usually lasts only a few minutes after urinating. °HOME CARE INSTRUCTIONS  °· Rest at home until you feel your energy improving. °· Only take over-the-counter or prescription medicines for pain, discomfort, or  fever as directed by your health care provider. Depending on the type of lithotripsy, you may need to take antibiotics and anti-inflammatory medicines for a few days. °· Drink enough water and fluids to keep your urine clear or pale yellow. This helps "flush" your kidneys. It helps pass any remaining pieces of stone and prevents stones from coming back. °· Most people can resume daily activities within 1-2 days after standard lithotripsy. It can take longer to recover from laser and percutaneous lithotripsy. °· Strain all urine through the provided strainer. Keep all particulate matter and stones for your health care provider to see. The stone may be as small as a grain of salt. It is very important to use the strainer each and every time you pass your urine. Any stones that are found can be sent to   a medical lab for examination.  Visit your health care provider for a follow-up appointment in a few weeks. Your doctor may remove your stent if you have one. Your health care provider will also check to see whether stone particles still remain. SEEK MEDICAL CARE IF:   Your pain is not relieved by medicine.  You have a lasting nauseous feeling.  You feel there is too much blood in the urine.  You develop persistent problems with frequent or painful urination that does not at least partially improve after 2 days following the procedure.  You have a congested cough.  You feel lightheaded.  You develop a rash or any other signs that might suggest an allergic problem.  You develop any reaction or side effects to your medicine(s). SEEK IMMEDIATE MEDICAL CARE IF:   You experience severe back or flank pain or both.  You see nothing but blood when you urinate.  You cannot pass any urine at all.  You have a fever or shaking chills.  You develop shortness of breath, difficulty breathing, or chest pain.  You develop vomiting that will not stop after 6-8 hours.  You have a fainting episode.   This  information is not intended to replace advice given to you by your health care provider. Make sure you discuss any questions you have with your health care provider.   Document Released: 11/14/2007 Document Revised: 07/16/2015 Document Reviewed: 05/10/2013 Elsevier Interactive Patient Education [employer].  Lithotripsy Lithotripsy is a treatment that can sometimes help eliminate kidney stones and pain that they cause. A form of lithotripsy, also known as extracorporeal shock wave lithotripsy, is a nonsurgical procedure that helps your body rid itself of the kidney stone when it is too big to pass on its own. Extracorporeal shock wave lithotripsy is a method of crushing a kidney stone with shock waves. These shock waves pass through your body and are focused on your stone. They cause the kidney stones to crumble while still in the urinary tract. It is then easier for the smaller pieces of stone to pass in the urine. Lithotripsy usually takes about an hour. It is done in a hospital, a lithotripsy center, or a mobile unit. It usually does not require an overnight stay. Your health care provider will instruct you on preparation for the procedure. Your health care provider will tell you what to expect afterward. LET [facility] CARE PROVIDER KNOW ABOUT:  Any allergies you have.  All medicines you are taking, including vitamins, herbs, eye drops, creams, and over-the-counter medicines.  Previous problems you or members of your family have had with the use of anesthetics.  Any blood disorders you have.  Previous surgeries you have had.  Medical conditions you have. RISKS AND COMPLICATIONS Generally, lithotripsy for kidney stones is a safe procedure. However, as with any procedure, complications can occur. Possible complications include:  Infection.  Bleeding of the kidney.  Bruising of the kidney or skin.  Obstruction of the ureter.  Failure of the stone to fragment. BEFORE THE  PROCEDURE  Do not eat or drink for 6-8 hours prior to the procedure. You may, however, take the medications with a sip of water that your physician instructs you to take  Do not take aspirin or aspirin-containing products for 7 days prior to your procedure  Do not take nonsteroidal anti-inflammatory products for 7 days prior to your procedure PROCEDURE A stent (flexible tube with holes) may be placed in your ureter. The ureter is  the tube that transports the urine from the kidneys to the bladder. Your health care provider may place a stent before the procedure. This will help keep urine flowing from the kidney if the fragments of the stone block the ureter. You may have an IV tube placed in one of your veins to give you fluids and medicines. These medicines may help you relax or make you sleep. During the procedure, you will lie comfortably on a fluid-filled cushion or in a warm-water bath. After an X-ray or ultrasound exam to locate your stone, shock waves are aimed at the stone. If you are awake, you may feel a tapping sensation as the shock waves pass through your body. If large stone particles remain after treatment, a second procedure may be necessary at a later date. °For comfort during the test: °· Relax as much as possible. °· Try to remain still as much as possible. °· Try to follow instructions to speed up the test. °· Let your health care provider know if you are uncomfortable, anxious, or in pain. °AFTER THE PROCEDURE  °After surgery, you will be taken to the recovery area. A nurse will watch and check your progress. Once you're awake, stable, and taking fluids well, you will be allowed to go home as long as there are no problems. You will also be allowed to pass your urine before discharge. You may be given antibiotics to help prevent infection. You may also be prescribed pain medicine if needed. In a week or two, your health care provider may remove your stent, if you have one. You may first  have an X-ray exam to check on how successful the fragmentation of your stone has been and how much of the stone has passed. Your health care provider will check to see whether or not stone particles remain. °SEEK IMMEDIATE MEDICAL CARE IF: °· You develop a fever or shaking chills. °· Your pain is not relieved by medicine. °· You feel sick to your stomach (nauseated) and you vomit. °· You develop heavy bleeding. °· You have difficulty urinating. °· You start to pass your stent from your penis. °  °This information is not intended to replace advice given to you by your health care provider. Make sure you discuss any questions you have with your health care provider. °  °Document Released: 10/22/2000 Document Revised: 11/15/2014 Document Reviewed: 05/10/2013 °Elsevier Interactive Patient Education ©2016 Else[employer] Inc. ° °Renal Colic °Renal colic is pain that is caused by passing a kidney stone. The pain can be sharp and severe. It may be felt in the back, abdomen, side (flank), or groin. It can cause nausea. Renal colic can come and go. °HOME CARE INSTRUCTIONS °Watch your condition for any changes. The following actions may help to lessen any discomfort that you are feeling: °· Take medicines only as directed by your health care provider. °· Ask your health care provider if it is okay to take over-the-counter pain medicine. °· Drink enough fluid to keep your urine clear or pale yellow. Drink 6-8 glasses of water each day. °· Limit the amount of salt that you eat to less than 2 grams per day. °· Reduce the amount of protein in your diet. Eat less meat, fish, nuts, and dairy. °· Avoid foods such as spinach, rhubarb, nuts, or bran. These may make kidney stones more likely to form. °SEEK MEDICAL CARE IF: °· You have a fever or chills. °· Your urine smells bad or looks cloudy. °· You have pain or   burning when you pass urine. SEEK IMMEDIATE MEDICAL CARE IF:  Your flank pain or groin pain suddenly worsens.  You become  confused or disoriented or you lose consciousness.   This information is not intended to replace advice given to you by your health care provider. Make sure you discuss any questions you have with your health care provider.   Document Released: 08/04/2005 Document Revised: 11/15/2014 Document Reviewed: 09/04/2014 Elsevier Interactive Patient Education 2016 [place].  Renal Colic Renal colic is pain that is caused by passing a kidney stone. The pain can be sharp and severe. It may be felt in the back, abdomen, side (flank), or groin. It can cause nausea. Renal colic can come and go. HOME CARE INSTRUCTIONS Watch your condition for any changes. The following actions may help to lessen any discomfort that you are feeling:  Take medicines only as directed by your health care provider.  Ask your health care provider if it is okay to take over-the-counter pain medicine.  Drink enough fluid to keep your urine clear or pale yellow. Drink 6-8 glasses of water each day.  Limit the amount of salt that you eat to less than 2 grams per day.  Reduce the amount of protein in your diet. Eat less meat, fish, nuts, and dairy.  Avoid foods such as spinach, rhubarb, nuts, or bran. These may make kidney stones more likely to form. SEEK MEDICAL CARE IF:  You have a fever or chills.  Your urine smells bad or looks cloudy.  You have pain or burning when you pass urine. SEEK IMMEDIATE MEDICAL CARE IF:  Your flank pain or groin pain suddenly worsens.  You become confused or disoriented or you lose consciousness.   This information is not intended to replace advice given to you by your health care provider. Make sure you discuss any questions you have with your health care provider.   Document Released: 08/04/2005 Document Revised: 11/15/2014 Document Reviewed: 09/04/2014 Elsevier Interactive Patient Education [employer].

## 2015-10-30 ENCOUNTER — Encounter: Payer: Self-pay | Admitting: *Deleted

## 2015-10-30 ENCOUNTER — Ambulatory Visit
Admission: RE | Admit: 2015-10-30 | Discharge: 2015-10-30 | Disposition: A | Discharge status: Home / Self Care | Payer: Commercial Managed Care - HMO | Source: Ambulatory Visit | Attending: Urology | Admitting: Urology

## 2015-10-30 ENCOUNTER — Encounter
Admission: RE | Disposition: A | Discharge status: Home / Self Care | Payer: Self-pay | Source: Ambulatory Visit | Attending: Urology

## 2015-10-30 DIAGNOSIS — G4730 Sleep apnea, unspecified: Secondary | ICD-10-CM | POA: Diagnosis not present

## 2015-10-30 DIAGNOSIS — Z7982 Long term (current) use of aspirin: Secondary | ICD-10-CM | POA: Insufficient documentation

## 2015-10-30 DIAGNOSIS — N200 Calculus of kidney: Secondary | ICD-10-CM | POA: Insufficient documentation

## 2015-10-30 DIAGNOSIS — I10 Essential (primary) hypertension: Secondary | ICD-10-CM | POA: Insufficient documentation

## 2015-10-30 HISTORY — PX: EXTRACORPOREAL SHOCK WAVE LITHOTRIPSY: SHX1557

## 2015-10-30 SURGERY — LITHOTRIPSY, ESWL
Anesthesia: Moderate Sedation | Laterality: Left

## 2015-10-30 MED ORDER — DEXTROSE-NACL 5-0.45 % IV SOLN
INTRAVENOUS | Status: DC
  Administered 2015-10-30: 12:00:00 via INTRAVENOUS

## 2015-10-30 MED ORDER — MORPHINE SULFATE (PF) 10 MG/ML IV SOLN
INTRAVENOUS | Status: AC
  Administered 2015-10-30: 10 mg
  Filled 2015-10-30: qty 1

## 2015-10-30 MED ORDER — FUROSEMIDE 10 MG/ML IJ SOLN
10.0000 mg | Freq: Once | INTRAMUSCULAR | Status: AC
  Administered 2015-10-30: 10 mg via INTRAVENOUS

## 2015-10-30 MED ORDER — PROMETHAZINE HCL 25 MG/ML IJ SOLN
25.0000 mg | Freq: Once | INTRAMUSCULAR | Status: AC
  Administered 2015-10-30: 25 mg via INTRAMUSCULAR

## 2015-10-30 MED ORDER — FUROSEMIDE 10 MG/ML IJ SOLN
INTRAMUSCULAR | Status: AC
  Administered 2015-10-30: 10 mg via INTRAVENOUS
  Filled 2015-10-30: qty 2

## 2015-10-30 MED ORDER — MORPHINE SULFATE (PF) 2 MG/ML IV SOLN
10.0000 mg | Freq: Once | INTRAVENOUS | Status: DC
  Filled 2015-10-30: qty 5

## 2015-10-30 MED ORDER — OXYCODONE-ACETAMINOPHEN 10-325 MG PO TABS: 1.0000 | ORAL_TABLET | ORAL | Status: DC | PRN

## 2015-10-30 MED ORDER — PROMETHAZINE HCL 25 MG RE SUPP: 25.0000 mg | Freq: Four times a day (QID) | RECTAL | Status: DC | PRN

## 2015-10-30 MED ORDER — LEVOFLOXACIN 500 MG PO TABS
500.0000 mg | ORAL_TABLET | ORAL | Status: AC
  Administered 2015-10-30: 500 mg via ORAL

## 2015-10-30 MED ORDER — DIPHENHYDRAMINE HCL 25 MG PO CAPS
ORAL_CAPSULE | ORAL | Status: AC
  Administered 2015-10-30: 25 mg via ORAL
  Filled 2015-10-30: qty 1

## 2015-10-30 MED ORDER — LEVOFLOXACIN 500 MG PO TABS
ORAL_TABLET | ORAL | Status: AC
  Administered 2015-10-30: 500 mg via ORAL
  Filled 2015-10-30: qty 1

## 2015-10-30 MED ORDER — MIDAZOLAM HCL 2 MG/2ML IJ SOLN
INTRAMUSCULAR | Status: AC
  Administered 2015-10-30: 2 mg
  Filled 2015-10-30: qty 2

## 2015-10-30 MED ORDER — MIDAZOLAM HCL 5 MG/ML IJ SOLN: 1.0000 mg | Freq: Once | INTRAMUSCULAR | Status: DC

## 2015-10-30 MED ORDER — DIPHENHYDRAMINE HCL 25 MG PO CAPS
25.0000 mg | ORAL_CAPSULE | ORAL | Status: AC
  Administered 2015-10-30: 25 mg via ORAL

## 2015-10-30 MED ORDER — LEVOFLOXACIN 500 MG PO TABS: 500.0000 mg | ORAL_TABLET | Freq: Every day | ORAL | Status: DC

## 2015-10-30 MED ORDER — PROMETHAZINE HCL 25 MG/ML IJ SOLN
INTRAMUSCULAR | Status: AC
  Administered 2015-10-30: 25 mg via INTRAMUSCULAR
  Filled 2015-10-30: qty 1

## 2015-10-30 NOTE — Discharge Instructions (Addendum)
Dietary Guidelines to Help Prevent Kidney Stones °Your risk of kidney stones can be decreased by adjusting the foods you eat. The most important thing you can do is drink enough fluid. You should drink enough fluid to keep your urine clear or pale yellow. The following guidelines provide specific information for the type of kidney stone you have had. °GUIDELINES ACCORDING TO TYPE OF KIDNEY STONE °Calcium Oxalate Kidney Stones °· Reduce the amount of salt you eat. Foods that have a lot of salt cause your body to release excess calcium into your urine. The excess calcium can combine with a substance called oxalate to form kidney stones. °· Reduce the amount of animal protein you eat if the amount you eat is excessive. Animal protein causes your body to release excess calcium into your urine. Ask your dietitian how much protein from animal sources you should be eating. °· Avoid foods that are high in oxalates. If you take vitamins, they should have less than 500 mg of vitamin C. Your body turns vitamin C into oxalates. You do not need to avoid fruits and vegetables high in vitamin C. °Calcium Phosphate Kidney Stones °· Reduce the amount of salt you eat to help prevent the release of excess calcium into your urine. °· Reduce the amount of animal protein you eat if the amount you eat is excessive. Animal protein causes your body to release excess calcium into your urine. Ask your dietitian how much protein from animal sources you should be eating. °· Get enough calcium from food or take a calcium supplement (ask your dietitian for recommendations). Food sources of calcium that do not increase your risk of kidney stones include: °· Broccoli. °· Dairy products, such as cheese and yogurt. °· Pudding. °Uric Acid Kidney Stones °· Do not have more than 6 oz of animal protein per day. °FOOD SOURCES °Animal Protein Sources °· Meat (all types). °· Poultry. °· Eggs. °· Fish, seafood. °Foods High in Salt °· Salt seasonings. °· Soy  sauce. °· Teriyaki sauce. °· Cured and processed meats. °· Salted crackers and snack foods. °· Fast food. °· Canned soups and most canned foods. °Foods High in Oxalates °· Grains: °· Amaranth. °· Barley. °· Grits. °· Wheat germ. °· Bran. °· Buckwheat flour. °· All bran cereals. °· Pretzels. °· Whole wheat bread. °· Vegetables: °· Beans (wax). °· Beets and beet greens. °· Collard greens. °· Eggplant. °· Escarole. °· Leeks. °· Okra. °· Parsley. °· Rutabagas. °· Spinach. °· Swiss chard. °· Tomato paste. °· Fried potatoes. °· Sweet potatoes. °· Fruits: °· Red currants. °· Figs. °· Kiwi. °· Rhubarb. °· Meat and Other Protein Sources: °· Beans (dried). °· Soy burgers and other soybean products. °· Miso. °· Nuts (peanuts, almonds, pecans, cashews, hazelnuts). °· Nut butters. °· Sesame seeds and tahini (paste made of sesame seeds). °· Poppy seeds. °· Beverages: °· Chocolate drink mixes. °· Soy milk. °· Instant iced tea. °· Juices made from high-oxalate fruits or vegetables. °· Other: °· Carob. °· Chocolate. °· Fruitcake. °· Marmalades. °  °This information is not intended to replace advice given to you by your health care provider. Make sure you discuss any questions you have with your health care provider. °  °Document Released: 02/19/2011 Document Revised: 10/30/2013 Document Reviewed: 09/21/2013 °Elsevier Interactive Patient Education ©2016 [employer] ° °Kidney Stones °Kidney stones (urolithiasis) are deposits that form inside your kidneys. The intense pain is caused by the stone moving through the urinary tract. When the stone moves, the ureter   goes into spasm around the stone. The stone is usually passed in the urine.  °CAUSES  °· A disorder that makes certain neck glands produce too much parathyroid hormone (primary hyperparathyroidism). °· A buildup of uric acid crystals, similar to gout in your joints. °· Narrowing (stricture) of the ureter. °· A kidney obstruction present at birth (congenital  obstruction). °· Previous surgery on the kidney or ureters. °· Numerous kidney infections. °SYMPTOMS  °· Feeling sick to your stomach (nauseous). °· Throwing up (vomiting). °· Blood in the urine (hematuria). °· Pain that usually spreads (radiates) to the groin. °· Frequency or urgency of urination. °DIAGNOSIS  °· Taking a history and physical exam. °· Blood or urine tests. °· CT scan. °· Occasionally, an examination of the inside of the urinary bladder (cystoscopy) is performed. °TREATMENT  °· Observation. °· Increasing your fluid intake. °· Extracorporeal shock wave lithotripsy--This is a noninvasive procedure that uses shock waves to break up kidney stones. °· Surgery may be needed if you have severe pain or persistent obstruction. There are various surgical procedures. Most of the procedures are performed with the use of small instruments. Only small incisions are needed to accommodate these instruments, so recovery time is minimized. °The size, location, and chemical composition are all important variables that will determine the proper choice of action for you. Talk to your health care provider to better understand your situation so that you will minimize the risk of injury to yourself and your kidney.  °HOME CARE INSTRUCTIONS  °· Drink enough water and fluids to keep your urine clear or pale yellow. This will help you to pass the stone or stone fragments. °· Strain all urine through the provided strainer. Keep all particulate matter and stones for your health care provider to see. The stone causing the pain may be as small as a grain of salt. It is very important to use the strainer each and every time you pass your urine. The collection of your stone will allow your health care provider to analyze it and verify that a stone has actually passed. The stone analysis will often identify what you can do to reduce the incidence of recurrences. °· Only take over-the-counter or prescription medicines for pain,  discomfort, or fever as directed by your health care provider. °· Keep all follow-up visits as told by your health care provider. This is important. °· Get follow-up X-rays if required. The absence of pain does not always mean that the stone has passed. It may have only stopped moving. If the urine remains completely obstructed, it can cause loss of kidney function or even complete destruction of the kidney. It is your responsibility to make sure X-rays and follow-ups are completed. Ultrasounds of the kidney can show blockages and the status of the kidney. Ultrasounds are not associated with any radiation and can be performed easily in a matter of minutes. °· Make changes to your daily diet as told by your health care provider. You may be told to: °· Limit the amount of salt that you eat. °· Eat 5 or more servings of fruits and vegetables each day. °· Limit the amount of meat, poultry, fish, and eggs that you eat. °· Collect a 24-hour urine sample as told by your health care provider. You may need to collect another urine sample every 6-12 months. °SEEK MEDICAL CARE IF: °· You experience pain that is progressive and unresponsive to any pain medicine you have been prescribed. °SEEK IMMEDIATE MEDICAL CARE IF:  °· Pain   cannot be controlled with the prescribed medicine.  You have a fever or shaking chills.  The severity or intensity of pain increases over 18 hours and is not relieved by pain medicine.  You develop a new onset of abdominal pain.  You feel faint or pass out.  You are unable to urinate.   This information is not intended to replace advice given to you by your health care provider. Make sure you discuss any questions you have with your health care provider.   Document Released: 10/25/2005 Document Revised: 07/16/2015 Document Reviewed: 03/28/2013 Elsevier Interactive Patient Education 2016 [place].  Kidney Stones Kidney stones (urolithiasis) are deposits that form inside your  kidneys. The intense pain is caused by the stone moving through the urinary tract. When the stone moves, the ureter goes into spasm around the stone. The stone is usually passed in the urine.  CAUSES   A disorder that makes certain neck glands produce too much parathyroid hormone (primary hyperparathyroidism).  A buildup of uric acid crystals, similar to gout in your joints.  Narrowing (stricture) of the ureter.  A kidney obstruction present at birth (congenital obstruction).  Previous surgery on the kidney or ureters.  Numerous kidney infections. SYMPTOMS   Feeling sick to your stomach (nauseous).  Throwing up (vomiting).  Blood in the urine (hematuria).  Pain that usually spreads (radiates) to the groin.  Frequency or urgency of urination. DIAGNOSIS   Taking a history and physical exam.  Blood or urine tests.  CT scan.  Occasionally, an examination of the inside of the urinary bladder (cystoscopy) is performed. TREATMENT   Observation.  Increasing your fluid intake.  Extracorporeal shock wave lithotripsy--This is a noninvasive procedure that uses shock waves to break up kidney stones.  Surgery may be needed if you have severe pain or persistent obstruction. There are various surgical procedures. Most of the procedures are performed with the use of small instruments. Only small incisions are needed to accommodate these instruments, so recovery time is minimized. The size, location, and chemical composition are all important variables that will determine the proper choice of action for you. Talk to your health care provider to better understand your situation so that you will minimize the risk of injury to yourself and your kidney.  HOME CARE INSTRUCTIONS   Drink enough water and fluids to keep your urine clear or pale yellow. This will help you to pass the stone or stone fragments.  Strain all urine through the provided strainer. Keep all particulate matter and stones  for your health care provider to see. The stone causing the pain may be as small as a grain of salt. It is very important to use the strainer each and every time you pass your urine. The collection of your stone will allow your health care provider to analyze it and verify that a stone has actually passed. The stone analysis will often identify what you can do to reduce the incidence of recurrences.  Only take over-the-counter or prescription medicines for pain, discomfort, or fever as directed by your health care provider.  Keep all follow-up visits as told by your health care provider. This is important.  Get follow-up X-rays if required. The absence of pain does not always mean that the stone has passed. It may have only stopped moving. If the urine remains completely obstructed, it can cause loss of kidney function or even complete destruction of the kidney. It is your responsibility to make sure X-rays and follow-ups are  completed. Ultrasounds of the kidney can show blockages and the status of the kidney. Ultrasounds are not associated with any radiation and can be performed easily in a matter of minutes.  Make changes to your daily diet as told by your health care provider. You may be told to:  Limit the amount of salt that you eat.  Eat 5 or more servings of fruits and vegetables each day.  Limit the amount of meat, poultry, fish, and eggs that you eat.  Collect a 24-hour urine sample as told by your health care provider.You may need to collect another urine sample every 6-12 months. SEEK MEDICAL CARE IF:  You experience pain that is progressive and unresponsive to any pain medicine you have been prescribed. SEEK IMMEDIATE MEDICAL CARE IF:   Pain cannot be controlled with the prescribed medicine.  You have a fever or shaking chills.  The severity or intensity of pain increases over 18 hours and is not relieved by pain medicine.  You develop a new onset of abdominal pain.  You  feel faint or pass out.  You are unable to urinate.   This information is not intended to replace advice given to you by your health care provider. Make sure you discuss any questions you have with your health care provider.   Document Released: 10/25/2005 Document Revised: 07/16/2015 Document Reviewed: 03/28/2013 Elsevier Interactive Patient Education 2016 [place], Care After Refer to this sheet in the next few weeks. These instructions provide you with information on caring for yourself after your procedure. Your health care provider may also give you more specific instructions. Your treatment has been planned according to current medical practices, but problems sometimes occur. Call your health care provider if you have any problems or questions after your procedure. WHAT TO EXPECT AFTER THE PROCEDURE   Your urine may have a red tinge for a few days after treatment. Blood loss is usually minimal.  You may have soreness in the back or flank area. This usually goes away after a few days. The procedure can cause blotches or bruises on the back where the pressure wave enters the skin. These marks usually cause only minimal discomfort and should disappear in a short time.  Stone fragments should begin to pass within 24 hours of treatment. However, a delayed passage is not unusual.  You may have pain, discomfort, and feel sick to your stomach (nauseated) when the crushed fragments of stone are passed down the tube from the kidney to the bladder. Stone fragments can pass soon after the procedure and may last for up to 4-8 weeks.  A small number of patients may have severe pain when stone fragments are not able to pass, which leads to an obstruction.  If your stone is greater than 1 inch (2.5 cm) in diameter or if you have multiple stones that have a combined diameter greater than 1 inch (2.5 cm), you may require more than one treatment.  If you had a stent placed prior to  your procedure, you may experience some discomfort, especially during urination. You may experience the pain or discomfort in your flank or back, or you may experience a sharp pain or discomfort at the base of your penis or in your lower abdomen. The discomfort usually lasts only a few minutes after urinating. HOME CARE INSTRUCTIONS   Rest at home until you feel your energy improving.  Only take over-the-counter or prescription medicines for pain, discomfort, or fever as directed by your  health care provider. Depending on the type of lithotripsy, you may need to take antibiotics and anti-inflammatory medicines for a few days.  Drink enough water and fluids to keep your urine clear or pale yellow. This helps "flush" your kidneys. It helps pass any remaining pieces of stone and prevents stones from coming back.  Most people can resume daily activities within 1-2 days after standard lithotripsy. It can take longer to recover from laser and percutaneous lithotripsy.  Strain all urine through the provided strainer. Keep all particulate matter and stones for your health care provider to see. The stone may be as small as a grain of salt. It is very important to use the strainer each and every time you pass your urine. Any stones that are found can be sent to a medical lab for examination.  Visit your health care provider for a follow-up appointment in a few weeks. Your doctor may remove your stent if you have one. Your health care provider will also check to see whether stone particles still remain. SEEK MEDICAL CARE IF:   Your pain is not relieved by medicine.  You have a lasting nauseous feeling.  You feel there is too much blood in the urine.  You develop persistent problems with frequent or painful urination that does not at least partially improve after 2 days following the procedure.  You have a congested cough.  You feel lightheaded.  You develop a rash or any other signs that might  suggest an allergic problem.  You develop any reaction or side effects to your medicine(s). SEEK IMMEDIATE MEDICAL CARE IF:   You experience severe back or flank pain or both.  You see nothing but blood when you urinate.  You cannot pass any urine at all.  You have a fever or shaking chills.  You develop shortness of breath, difficulty breathing, or chest pain.  You develop vomiting that will not stop after 6-8 hours.  You have a fainting episode.   This information is not intended to replace advice given to you by your health care provider. Make sure you discuss any questions you have with your health care provider.   Document Released: 11/14/2007 Document Revised: 07/16/2015 Document Reviewed: 05/10/2013 Elsevier Interactive Patient Education 2016 [place].  Lithotripsy, Care After Refer to this sheet in the next few weeks. These instructions provide you with information on caring for yourself after your procedure. Your health care provider may also give you more specific instructions. Your treatment has been planned according to current medical practices, but problems sometimes occur. Call your health care provider if you have any problems or questions after your procedure. WHAT TO EXPECT AFTER THE PROCEDURE   Your urine may have a red tinge for a few days after treatment. Blood loss is usually minimal.  You may have soreness in the back or flank area. This usually goes away after a few days. The procedure can cause blotches or bruises on the back where the pressure wave enters the skin. These marks usually cause only minimal discomfort and should disappear in a short time.  Stone fragments should begin to pass within 24 hours of treatment. However, a delayed passage is not unusual.  You may have pain, discomfort, and feel sick to your stomach (nauseated) when the crushed fragments of stone are passed down the tube from the kidney to the bladder. Stone fragments can pass  soon after the procedure and may last for up to 4-8 weeks.  A small number  of patients may have severe pain when stone fragments are not able to pass, which leads to an obstruction.  If your stone is greater than 1 inch (2.5 cm) in diameter or if you have multiple stones that have a combined diameter greater than 1 inch (2.5 cm), you may require more than one treatment.  If you had a stent placed prior to your procedure, you may experience some discomfort, especially during urination. You may experience the pain or discomfort in your flank or back, or you may experience a sharp pain or discomfort at the base of your penis or in your lower abdomen. The discomfort usually lasts only a few minutes after urinating. HOME CARE INSTRUCTIONS   Rest at home until you feel your energy improving.  Only take over-the-counter or prescription medicines for pain, discomfort, or fever as directed by your health care provider. Depending on the type of lithotripsy, you may need to take antibiotics and anti-inflammatory medicines for a few days.  Drink enough water and fluids to keep your urine clear or pale yellow. This helps "flush" your kidneys. It helps pass any remaining pieces of stone and prevents stones from coming back.  Most people can resume daily activities within 1-2 days after standard lithotripsy. It can take longer to recover from laser and percutaneous lithotripsy.  Strain all urine through the provided strainer. Keep all particulate matter and stones for your health care provider to see. The stone may be as small as a grain of salt. It is very important to use the strainer each and every time you pass your urine. Any stones that are found can be sent to a medical lab for examination.  Visit your health care provider for a follow-up appointment in a few weeks. Your doctor may remove your stent if you have one. Your health care provider will also check to see whether stone particles still  remain. SEEK MEDICAL CARE IF:   Your pain is not relieved by medicine.  You have a lasting nauseous feeling.  You feel there is too much blood in the urine.  You develop persistent problems with frequent or painful urination that does not at least partially improve after 2 days following the procedure.  You have a congested cough.  You feel lightheaded.  You develop a rash or any other signs that might suggest an allergic problem.  You develop any reaction or side effects to your medicine(s). SEEK IMMEDIATE MEDICAL CARE IF:   You experience severe back or flank pain or both.  You see nothing but blood when you urinate.  You cannot pass any urine at all.  You have a fever or shaking chills.  You develop shortness of breath, difficulty breathing, or chest pain.  You develop vomiting that will not stop after 6-8 hours.  You have a fainting episode.   This information is not intended to replace advice given to you by your health care provider. Make sure you discuss any questions you have with your health care provider.   Document Released: 11/14/2007 Document Revised: 07/16/2015 Document Reviewed: 05/10/2013 Elsevier Interactive Patient Education [employer].  Lithotripsy Lithotripsy is a treatment that can sometimes help eliminate kidney stones and pain that they cause. A form of lithotripsy, also known as extracorporeal shock wave lithotripsy, is a nonsurgical procedure that helps your body rid itself of the kidney stone when it is too big to pass on its own. Extracorporeal shock wave lithotripsy is a method of crushing a kidney stone  with shock waves. These shock waves pass through your body and are focused on your stone. They cause the kidney stones to crumble while still in the urinary tract. It is then easier for the smaller pieces of stone to pass in the urine. Lithotripsy usually takes about an hour. It is done in a hospital, a lithotripsy center, or a mobile unit.  It usually does not require an overnight stay. Your health care provider will instruct you on preparation for the procedure. Your health care provider will tell you what to expect afterward. LET [facility] CARE PROVIDER KNOW ABOUT:  Any allergies you have.  All medicines you are taking, including vitamins, herbs, eye drops, creams, and over-the-counter medicines.  Previous problems you or members of your family have had with the use of anesthetics.  Any blood disorders you have.  Previous surgeries you have had.  Medical conditions you have. RISKS AND COMPLICATIONS Generally, lithotripsy for kidney stones is a safe procedure. However, as with any procedure, complications can occur. Possible complications include:  Infection.  Bleeding of the kidney.  Bruising of the kidney or skin.  Obstruction of the ureter.  Failure of the stone to fragment. BEFORE THE PROCEDURE  Do not eat or drink for 6-8 hours prior to the procedure. You may, however, take the medications with a sip of water that your physician instructs you to take  Do not take aspirin or aspirin-containing products for 7 days prior to your procedure  Do not take nonsteroidal anti-inflammatory products for 7 days prior to your procedure PROCEDURE A stent (flexible tube with holes) may be placed in your ureter. The ureter is the tube that transports the urine from the kidneys to the bladder. Your health care provider may place a stent before the procedure. This will help keep urine flowing from the kidney if the fragments of the stone block the ureter. You may have an IV tube placed in one of your veins to give you fluids and medicines. These medicines may help you relax or make you sleep. During the procedure, you will lie comfortably on a fluid-filled cushion or in a warm-water bath. After an X-ray or ultrasound exam to locate your stone, shock waves are aimed at the stone. If you are awake, you may feel a tapping sensation  as the shock waves pass through your body. If large stone particles remain after treatment, a second procedure may be necessary at a later date. For comfort during the test:  Relax as much as possible.  Try to remain still as much as possible.  Try to follow instructions to speed up the test.  Let your health care provider know if you are uncomfortable, anxious, or in pain. AFTER THE PROCEDURE  After surgery, you will be taken to the recovery area. A nurse will watch and check your progress. Once you're awake, stable, and taking fluids well, you will be allowed to go home as long as there are no problems. You will also be allowed to pass your urine before discharge.You may be given antibiotics to help prevent infection. You may also be prescribed pain medicine if needed. In a week or two, your health care provider may remove your stent, if you have one. You may first have an X-ray exam to check on how successful the fragmentation of your stone has been and how much of the stone has passed. Your health care provider will check to see whether or not stone particles remain. [place]  IF:  You develop a fever or shaking chills.  Your pain is not relieved by medicine.  You feel sick to your stomach (nauseated) and you vomit.  You develop heavy bleeding.  You have difficulty urinating.  You start to pass your stent from your penis.   This information is not intended to replace advice given to you by your health care provider. Make sure you discuss any questions you have with your health care provider.   Document Released: 10/22/2000 Document Revised: 11/15/2014 Document Reviewed: 05/10/2013 Elsevier Interactive Patient Education 2016 [place].  Renal Colic Renal colic is pain that is caused by passing a kidney stone. The pain can be sharp and severe. It may be felt in the back, abdomen, side (flank), or groin. It can cause nausea. Renal colic can come and go. HOME  CARE INSTRUCTIONS Watch your condition for any changes. The following actions may help to lessen any discomfort that you are feeling:  Take medicines only as directed by your health care provider.  Ask your health care provider if it is okay to take over-the-counter pain medicine.  Drink enough fluid to keep your urine clear or pale yellow. Drink 6-8 glasses of water each day.  Limit the amount of salt that you eat to less than 2 grams per day.  Reduce the amount of protein in your diet. Eat less meat, fish, nuts, and dairy.  Avoid foods such as spinach, rhubarb, nuts, or bran. These may make kidney stones more likely to form. SEEK MEDICAL CARE IF:  You have a fever or chills.  Your urine smells bad or looks cloudy.  You have pain or burning when you pass urine. SEEK IMMEDIATE MEDICAL CARE IF:  Your flank pain or groin pain suddenly worsens.  You become confused or disoriented or you lose consciousness.   This information is not intended to replace advice given to you by your health care provider. Make sure you discuss any questions you have with your health care provider.   Document Released: 08/04/2005 Document Revised: 11/15/2014 Document Reviewed: 09/04/2014 Elsevier Interactive Patient Education 2016 [place]   1) The drugs that you were given will stay in your system until tomorrow so for the next 24 hours you should not:  A) Drive an automobile B) Make any legal decisions C) Drink any alcoholic beverage   2) You may resume regular meals tomorrow.  Today it is better to start with liquids and gradually work up to solid foods.  You may eat anything you prefer, but it is better to start with liquids, then soup and crackers, and gradually work up to solid foods.   3) Please notify your doctor immediately if you have any unusual bleeding, trouble breathing, redness and pain at the surgery site, drainage, fever,  or pain not relieved by medication.    4) Additional Instructions:        Please contact your physician with any problems or Same Day Surgery at [phone], Monday through Friday 6 am to 4 pm, or [place] at [facility] number at [phone].

## 2015-10-31 ENCOUNTER — Encounter: Payer: Self-pay | Admitting: Urology

## 2016-01-01 ENCOUNTER — Other Ambulatory Visit: Payer: Self-pay | Admitting: Family Medicine

## 2016-02-27 ENCOUNTER — Encounter: Payer: 59 | Admitting: Family Medicine

## 2016-04-01 ENCOUNTER — Ambulatory Visit (INDEPENDENT_AMBULATORY_CARE_PROVIDER_SITE_OTHER): Payer: Commercial Managed Care - HMO | Admitting: Family Medicine

## 2016-04-01 ENCOUNTER — Encounter: Payer: Self-pay | Admitting: Family Medicine

## 2016-04-01 VITALS — BP 131/88 | HR 69 | Temp 98.0°F | Ht 68.5 in | Wt 197.0 lb

## 2016-04-01 DIAGNOSIS — Z9989 Dependence on other enabling machines and devices: Secondary | ICD-10-CM

## 2016-04-01 DIAGNOSIS — E785 Hyperlipidemia, unspecified: Secondary | ICD-10-CM

## 2016-04-01 DIAGNOSIS — G4733 Obstructive sleep apnea (adult) (pediatric): Secondary | ICD-10-CM | POA: Diagnosis not present

## 2016-04-01 DIAGNOSIS — I10 Essential (primary) hypertension: Secondary | ICD-10-CM

## 2016-04-01 DIAGNOSIS — Z0000 Encounter for general adult medical examination without abnormal findings: Secondary | ICD-10-CM | POA: Diagnosis not present

## 2016-04-01 LAB — URINALYSIS, ROUTINE W REFLEX MICROSCOPIC
Bilirubin, UA: NEGATIVE
Glucose, UA: NEGATIVE
KETONES UA: NEGATIVE
LEUKOCYTES UA: NEGATIVE
NITRITE UA: NEGATIVE
Protein, UA: NEGATIVE
SPEC GRAV UA: 1.02 (ref 1.005–1.030)
UUROB: 0.2 mg/dL (ref 0.2–1.0)
pH, UA: 6 (ref 5.0–7.5)

## 2016-04-01 LAB — MICROSCOPIC EXAMINATION: Epithelial Cells (non renal): NONE SEEN /hpf (ref 0–10)

## 2016-04-01 MED ORDER — ATORVASTATIN CALCIUM 40 MG PO TABS: 40.0000 mg | ORAL_TABLET | Freq: Every day | ORAL | Status: DC

## 2016-04-01 MED ORDER — METOPROLOL SUCCINATE ER 50 MG PO TB24: 50.0000 mg | ORAL_TABLET | Freq: Every day | ORAL | Status: DC

## 2016-04-01 MED ORDER — AMLODIPINE BESYLATE 5 MG PO TABS: 5.0000 mg | ORAL_TABLET | Freq: Every day | ORAL | Status: DC

## 2016-04-01 MED ORDER — LISINOPRIL 40 MG PO TABS: 40.0000 mg | ORAL_TABLET | Freq: Every day | ORAL | Status: DC

## 2016-04-01 NOTE — Assessment & Plan Note (Signed)
Discussed importance of CPAP and possible adjustments

## 2016-04-01 NOTE — Assessment & Plan Note (Signed)
The current medical regimen is effective;  continue present plan and medications.  

## 2016-04-01 NOTE — Progress Notes (Signed)
BP 131/88 mmHg  Pulse 69  Temp(Src) 98 F (36.7 C)  Ht 5' 8.5" (1.74 m)  Wt 197 lb (89.359 kg)  BMI 29.51 kg/m2   Subjective:    Patient ID: Craig Ryan, male    DOB: 12/27/1960, 55 y.o.   MRN: [id number]  HPI: Craig Ryan is a 55 y.o. male  Chief Complaint  Patient presents with  . Annual Exam  Patient recheck doing well no complaints blood pressure doing well with medication taken faithfully Cholesterol doing well no complaints taking medications faithfully allergy vitamins doing well CPAP not using faithfully discuss adjustments and changes.  Relevant past medical, surgical, family and social history reviewed and updated as indicated. Interim medical history since our last visit reviewed. Allergies and medications reviewed and updated.  Review of Systems  Constitutional: Negative.   HENT: Negative.   Eyes: Negative.   Respiratory: Negative.   Cardiovascular: Negative.   Gastrointestinal: Negative.   Endocrine: Negative.   Genitourinary: Negative.   Musculoskeletal: Negative.   Skin: Negative.   Allergic/Immunologic: Negative.   Neurological: Negative.   Hematological: Negative.   Psychiatric/Behavioral: Negative.     Per HPI unless specifically indicated above     Objective:    BP 131/88 mmHg  Pulse 69  Temp(Src) 98 F (36.7 C)  Ht 5' 8.5" (1.74 m)  Wt 197 lb (89.359 kg)  BMI 29.51 kg/m2  Wt Readings from Last 3 Encounters:  04/01/16 197 lb (89.359 kg)  10/30/15 205 lb (92.987 kg)  10/16/15 205 lb (92.987 kg)    Physical Exam  Constitutional: He is oriented to person, place, and time. He appears well-developed and well-nourished.  HENT:  Head: Normocephalic and atraumatic.  Right Ear: External ear normal.  Left Ear: External ear normal.  Eyes: Conjunctivae and EOM are normal. Pupils are equal, round, and reactive to light.  Neck: Normal range of motion. Neck supple.  Cardiovascular: Normal rate, regular rhythm, normal heart sounds and  intact distal pulses.   Pulmonary/Chest: Effort normal and breath sounds normal.  Abdominal: Soft. Bowel sounds are normal. There is no splenomegaly or hepatomegaly.  Genitourinary: Rectum normal, prostate normal and penis normal.  Musculoskeletal: Normal range of motion.  Neurological: He is alert and oriented to person, place, and time. He has normal reflexes.  Skin: No rash noted. No erythema.  Psychiatric: He has a normal mood and affect. His behavior is normal. Judgment and thought content normal.    Results for orders placed or performed in visit on 08/25/15  LP+ALT+AST Piccolo, [employer]  Result Value Ref Range   ALT (SGPT) Piccolo, Waived 35 10 - 47 U/L   AST (SGOT) Piccolo, Waived 33 11 - 38 U/L   Cholesterol Piccolo, Waived 159 <200 mg/dL   HDL Chol Piccolo, Waived 54 (L) >59 mg/dL   Triglycerides Piccolo,Waived 92 <150 mg/dL   Chol/HDL Ratio Piccolo,Waive 3.0 mg/dL   LDL Chol Calc Piccolo Waived 87 <100 mg/dL   VLDL Chol Calc Piccolo,Waive 18 <30 mg/dL  Basic metabolic panel  Result Value Ref Range   Glucose 98 65 - 99 mg/dL   BUN 15 6 - 24 mg/dL   Creatinine, Ser 1.06 0.76 - 1.27 mg/dL   GFR calc non Af Amer 79 >59 mL/min/1.73   GFR calc Af Amer 92 >59 mL/min/1.73   BUN/Creatinine Ratio 14 9 - 20   Sodium 142 136 - 144 mmol/L   Potassium 4.2 3.5 - 5.2 mmol/L   Chloride 103 97 - 106 mmol/L  CO2 24 18 - 29 mmol/L   Calcium 9.0 8.7 - 10.2 mg/dL      Assessment & Plan:   Problem List Items Addressed This Visit      Cardiovascular and Mediastinum   Hypertension    The current medical regimen is effective;  continue present plan and medications.       Relevant Medications   aspirin EC 81 MG tablet   amLODipine (NORVASC) 5 MG tablet   atorvastatin (LIPITOR) 40 MG tablet   lisinopril (PRINIVIL,ZESTRIL) 40 MG tablet   metoprolol succinate (TOPROL-XL) 50 MG 24 hr tablet     Respiratory   OSA on CPAP    Discussed importance of CPAP and possible adjustments         Other   Hyperlipidemia    The current medical regimen is effective;  continue present plan and medications.       Relevant Medications   aspirin EC 81 MG tablet   amLODipine (NORVASC) 5 MG tablet   atorvastatin (LIPITOR) 40 MG tablet   lisinopril (PRINIVIL,ZESTRIL) 40 MG tablet   metoprolol succinate (TOPROL-XL) 50 MG 24 hr tablet    Other Visit Diagnoses    Routine general medical examination at a health care facility    -  Primary    Relevant Orders    CBC with Differential/Platelet    Comprehensive metabolic panel    Lipid Panel w/o Chol/HDL Ratio    PSA    TSH    Urinalysis, Routine w reflex microscopic (not at [facility])    Healthcare maintenance        Relevant Orders    Hepatitis C Antibody        Follow up plan: Return in about 6 months (around 10/02/2016), or if symptoms worsen or fail to improve, for bmp, lipids, alt, ast.

## 2016-04-01 NOTE — Addendum Note (Signed)
Addended by: Rowe Clack H on: 04/01/2016 10:14 AM   Modules accepted: Miquel Dunn

## 2016-04-02 LAB — COMPREHENSIVE METABOLIC PANEL
ALT: 24 IU/L (ref 0–44)
AST: 20 IU/L (ref 0–40)
Albumin/Globulin Ratio: 1.8 (ref 1.2–2.2)
Albumin: 4.4 g/dL (ref 3.5–5.5)
Alkaline Phosphatase: 83 IU/L (ref 39–117)
BUN/Creatinine Ratio: 16 (ref 9–20)
BUN: 16 mg/dL (ref 6–24)
Bilirubin Total: 0.5 mg/dL (ref 0.0–1.2)
CALCIUM: 9.3 mg/dL (ref 8.7–10.2)
CO2: 24 mmol/L (ref 18–29)
CREATININE: 1.02 mg/dL (ref 0.76–1.27)
Chloride: 102 mmol/L (ref 96–106)
GFR calc Af Amer: 96 mL/min/{1.73_m2} (ref >59)
GFR, EST NON AFRICAN AMERICAN: 83 mL/min/{1.73_m2} (ref >59)
GLOBULIN, TOTAL: 2.4 g/dL (ref 1.5–4.5)
Glucose: 85 mg/dL (ref 65–99)
Potassium: 4.3 mmol/L (ref 3.5–5.2)
Sodium: 143 mmol/L (ref 134–144)
TOTAL PROTEIN: 6.8 g/dL (ref 6.0–8.5)

## 2016-04-02 LAB — CBC WITH DIFFERENTIAL/PLATELET
Basophils Absolute: 0 10*3/uL (ref 0.0–0.2)
Basos: 1 %
EOS (ABSOLUTE): 0.1 10*3/uL (ref 0.0–0.4)
Eos: 2 %
HEMATOCRIT: 44.1 % (ref 37.5–51.0)
Hemoglobin: 15.3 g/dL (ref 12.6–17.7)
IMMATURE GRANS (ABS): 0 10*3/uL (ref 0.0–0.1)
IMMATURE GRANULOCYTES: 0 %
LYMPHS: 26 %
Lymphocytes Absolute: 1.8 10*3/uL (ref 0.7–3.1)
MCH: 31 pg (ref 26.6–33.0)
MCHC: 34.7 g/dL (ref 31.5–35.7)
MCV: 89 fL (ref 79–97)
MONOS ABS: 0.6 10*3/uL (ref 0.1–0.9)
Monocytes: 9 %
NEUTROS PCT: 62 %
Neutrophils Absolute: 4.2 10*3/uL (ref 1.4–7.0)
PLATELETS: 263 10*3/uL (ref 150–379)
RBC: 4.94 x10E6/uL (ref 4.14–5.80)
RDW: 13.1 % (ref 12.3–15.4)
WBC: 6.7 10*3/uL (ref 3.4–10.8)

## 2016-04-02 LAB — TSH: TSH: 0.95 u[IU]/mL (ref 0.450–4.500)

## 2016-04-02 LAB — LIPID PANEL W/O CHOL/HDL RATIO
CHOLESTEROL TOTAL: 135 mg/dL (ref 100–199)
HDL: 54 mg/dL (ref >39)
LDL CALC: 60 mg/dL (ref 0–99)
TRIGLYCERIDES: 105 mg/dL (ref 0–149)
VLDL CHOLESTEROL CAL: 21 mg/dL (ref 5–40)

## 2016-04-02 LAB — PSA: PROSTATE SPECIFIC AG, SERUM: 1.2 ng/mL (ref 0.0–4.0)

## 2016-04-02 LAB — HEPATITIS C ANTIBODY: Hep C Virus Ab: <0.1 s/co ratio (ref 0.0–0.9)

## 2016-04-06 ENCOUNTER — Encounter: Payer: Self-pay | Admitting: Family Medicine

## 2016-10-04 ENCOUNTER — Encounter: Payer: Self-pay | Admitting: Family Medicine

## 2016-10-04 ENCOUNTER — Ambulatory Visit (INDEPENDENT_AMBULATORY_CARE_PROVIDER_SITE_OTHER): Payer: Commercial Managed Care - HMO | Admitting: Family Medicine

## 2016-10-04 VITALS — BP 132/86 | HR 66 | Temp 98.0°F | Ht 68.0 in | Wt 204.0 lb

## 2016-10-04 DIAGNOSIS — I10 Essential (primary) hypertension: Secondary | ICD-10-CM | POA: Diagnosis not present

## 2016-10-04 DIAGNOSIS — Z9989 Dependence on other enabling machines and devices: Secondary | ICD-10-CM | POA: Diagnosis not present

## 2016-10-04 DIAGNOSIS — G4733 Obstructive sleep apnea (adult) (pediatric): Secondary | ICD-10-CM | POA: Diagnosis not present

## 2016-10-04 DIAGNOSIS — R7303 Prediabetes: Secondary | ICD-10-CM | POA: Diagnosis not present

## 2016-10-04 DIAGNOSIS — E785 Hyperlipidemia, unspecified: Secondary | ICD-10-CM

## 2016-10-04 LAB — LP+ALT+AST PICCOLO, WAIVED
ALT (SGPT) PICCOLO, WAIVED: 37 U/L (ref 10–47)
AST (SGOT) PICCOLO, WAIVED: 33 U/L (ref 11–38)
Chol/HDL Ratio Piccolo,Waive: 2.8 mg/dL
Cholesterol Piccolo, Waived: 158 mg/dL (ref <200)
HDL CHOL PICCOLO, WAIVED: 56 mg/dL — AB (ref >59)
LDL Chol Calc Piccolo Waived: 72 mg/dL (ref <100)
TRIGLYCERIDES PICCOLO,WAIVED: 141 mg/dL (ref <150)
VLDL Chol Calc Piccolo,Waive: 28 mg/dL (ref <30)

## 2016-10-04 LAB — BAYER DCA HB A1C WAIVED: HB A1C: 5.5 % (ref <7.0)

## 2016-10-04 NOTE — Assessment & Plan Note (Signed)
The current medical regimen is effective;  continue present plan and medications.  

## 2016-10-04 NOTE — Assessment & Plan Note (Signed)
Discuss use of mask and special situations encourage continuous use.

## 2016-10-04 NOTE — Progress Notes (Signed)
BP 132/86 (BP Location: Left Arm, Patient Position: Sitting, Cuff Size: Normal)   Pulse 66   Temp 98 F (36.7 C)   Ht 5\' 8"  (1.727 m)   Wt 204 lb (92.5 kg)   BMI 31.02 kg/m    Subjective:    Patient ID: Craig Ryan, male    DOB: Mar 15, 1961, 55 y.o.   MRN: [id number]  HPI: Craig Ryan is a 55 y.o. male  Chief Complaint  Patient presents with  . Follow-up  . Hyperlipidemia  . Hypertension   Patient was noted by urology to have's glucose in his urine. Hemoglobin A1c was done showing 6.0 in May. Doing well with blood pressure cholesterol no complaints from medications takes medications faithfully without problems. Does have family history of diabetes with his brother. Has been trying to do a little better diet. Relevant past medical, surgical, family and social history reviewed and updated as indicated. Interim medical history since our last visit reviewed. Allergies and medications reviewed and updated.  Review of Systems  Constitutional: Negative.   Respiratory: Negative.   Cardiovascular: Negative.     Per HPI unless specifically indicated above     Objective:    BP 132/86 (BP Location: Left Arm, Patient Position: Sitting, Cuff Size: Normal)   Pulse 66   Temp 98 F (36.7 C)   Ht 5\' 8"  (1.727 m)   Wt 204 lb (92.5 kg)   BMI 31.02 kg/m   Wt Readings from Last 3 Encounters:  10/04/16 204 lb (92.5 kg)  04/01/16 197 lb (89.4 kg)  10/30/15 205 lb (93 kg)    Physical Exam  Constitutional: He is oriented to person, place, and time. He appears well-developed and well-nourished. No distress.  HENT:  Head: Normocephalic and atraumatic.  Right Ear: Hearing normal.  Left Ear: Hearing normal.  Nose: Nose normal.  Eyes: Conjunctivae and lids are normal. Right eye exhibits no discharge. Left eye exhibits no discharge. No scleral icterus.  Cardiovascular: Normal rate, regular rhythm and normal heart sounds.   Pulmonary/Chest: Effort normal and breath sounds  normal. No respiratory distress.  Musculoskeletal: Normal range of motion.  Neurological: He is alert and oriented to person, place, and time.  Skin: Skin is intact. No rash noted.  Psychiatric: He has a normal mood and affect. His speech is normal and behavior is normal. Judgment and thought content normal. Cognition and memory are normal.    Results for orders placed or performed in visit on 04/01/16  Microscopic Examination  Result Value Ref Range   WBC, UA 0-5 0 - 5 /hpf   RBC, UA 0-2 0 - 2 /hpf   Epithelial Cells (non renal) None seen 0 - 10 /hpf   Bacteria, UA Few None seen/Few  CBC with Differential/Platelet  Result Value Ref Range   WBC 6.7 3.4 - 10.8 x10E3/uL   RBC 4.94 4.14 - 5.80 x10E6/uL   Hemoglobin 15.3 12.6 - 17.7 g/dL   Hematocrit 44.1 37.5 - 51.0 %   MCV 89 79 - 97 fL   MCH 31.0 26.6 - 33.0 pg   MCHC 34.7 31.5 - 35.7 g/dL   RDW 13.1 12.3 - 15.4 %   Platelets 263 150 - 379 x10E3/uL   Neutrophils 62 %   Lymphs 26 %   Monocytes 9 %   Eos 2 %   Basos 1 %   Neutrophils Absolute 4.2 1.4 - 7.0 x10E3/uL   Lymphocytes Absolute 1.8 0.7 - 3.1 x10E3/uL   Monocytes Absolute  0.6 0.1 - 0.9 x10E3/uL   EOS (ABSOLUTE) 0.1 0.0 - 0.4 x10E3/uL   Basophils Absolute 0.0 0.0 - 0.2 x10E3/uL   Immature Granulocytes 0 %   Immature Grans (Abs) 0.0 0.0 - 0.1 x10E3/uL  Comprehensive metabolic panel  Result Value Ref Range   Glucose 85 65 - 99 mg/dL   BUN 16 6 - 24 mg/dL   Creatinine, Ser 1.02 0.76 - 1.27 mg/dL   GFR calc non Af Amer 83 >59 mL/min/1.73   GFR calc Af Amer 96 >59 mL/min/1.73   BUN/Creatinine Ratio 16 9 - 20   Sodium 143 134 - 144 mmol/L   Potassium 4.3 3.5 - 5.2 mmol/L   Chloride 102 96 - 106 mmol/L   CO2 24 18 - 29 mmol/L   Calcium 9.3 8.7 - 10.2 mg/dL   Total Protein 6.8 6.0 - 8.5 g/dL   Albumin 4.4 3.5 - 5.5 g/dL   Globulin, Total 2.4 1.5 - 4.5 g/dL   Albumin/Globulin Ratio 1.8 1.2 - 2.2   Bilirubin Total 0.5 0.0 - 1.2 mg/dL   Alkaline Phosphatase 83 39 -  117 IU/L   AST 20 0 - 40 IU/L   ALT 24 0 - 44 IU/L  Lipid Panel w/o Chol/HDL Ratio  Result Value Ref Range   Cholesterol, Total 135 100 - 199 mg/dL   Triglycerides 105 0 - 149 mg/dL   HDL 54 >39 mg/dL   VLDL Cholesterol Cal 21 5 - 40 mg/dL   LDL Calculated 60 0 - 99 mg/dL  PSA  Result Value Ref Range   Prostate Specific Ag, Serum 1.2 0.0 - 4.0 ng/mL  TSH  Result Value Ref Range   TSH 0.950 0.450 - 4.500 uIU/mL  Urinalysis, Routine w reflex microscopic (not at [facility])  Result Value Ref Range   Specific Gravity, UA 1.020 1.005 - 1.030   pH, UA 6.0 5.0 - 7.5   Color, UA Yellow Yellow   Appearance Ur Clear Clear   Leukocytes, UA Negative Negative   Protein, UA Negative Negative/Trace   Glucose, UA Negative Negative   Ketones, UA Negative Negative   RBC, UA Trace (A) Negative   Bilirubin, UA Negative Negative   Urobilinogen, Ur 0.2 0.2 - 1.0 mg/dL   Nitrite, UA Negative Negative   Microscopic Examination See below:   Hepatitis C Antibody  Result Value Ref Range   Hep C Virus Ab <0.1 0.0 - 0.9 s/co ratio      Assessment & Plan:   Problem List Items Addressed This Visit      Cardiovascular and Mediastinum   Hypertension    The current medical regimen is effective;  continue present plan and medications.       Relevant Orders   Basic metabolic panel     Respiratory   OSA on CPAP    Discuss use of mask and special situations encourage continuous use.        Other   Hyperlipidemia - Primary    The current medical regimen is effective;  continue present plan and medications.       Relevant Orders   LP+ALT+AST Piccolo, Waived   Prediabetes    A1c of 6. This was in May. Now A1c of 5.5 non-prediabetes range. Patient doing better will continue good care discuss modest weight loss avoiding concentrated sweets. Will recheck A1c at physical and spring.      Relevant Orders   Bayer DCA Hb A1c Waived       Follow up plan:  Return in about 6 months (around 04/03/2017)  for Hemoglobin A1c, Physical Exam.

## 2016-10-04 NOTE — Assessment & Plan Note (Signed)
A1c of 6. This was in May. Now A1c of 5.5 non-prediabetes range. Patient doing better will continue good care discuss modest weight loss avoiding concentrated sweets. Will recheck A1c at physical and spring.

## 2016-10-05 ENCOUNTER — Encounter: Payer: Self-pay | Admitting: Family Medicine

## 2016-10-05 LAB — BASIC METABOLIC PANEL
BUN/Creatinine Ratio: 12 (ref 9–20)
BUN: 13 mg/dL (ref 6–24)
CHLORIDE: 101 mmol/L (ref 96–106)
CO2: 24 mmol/L (ref 18–29)
Calcium: 8.9 mg/dL (ref 8.7–10.2)
Creatinine, Ser: 1.12 mg/dL (ref 0.76–1.27)
GFR calc Af Amer: 85 mL/min/{1.73_m2} (ref >59)
GFR calc non Af Amer: 74 mL/min/{1.73_m2} (ref >59)
GLUCOSE: 87 mg/dL (ref 65–99)
POTASSIUM: 4.2 mmol/L (ref 3.5–5.2)
SODIUM: 143 mmol/L (ref 134–144)

## 2017-04-06 ENCOUNTER — Encounter: Payer: Self-pay | Admitting: Family Medicine

## 2017-04-06 ENCOUNTER — Ambulatory Visit (INDEPENDENT_AMBULATORY_CARE_PROVIDER_SITE_OTHER): Payer: Commercial Managed Care - HMO | Admitting: Family Medicine

## 2017-04-06 VITALS — BP 131/85 | HR 77 | Ht 68.9 in | Wt 205.0 lb

## 2017-04-06 DIAGNOSIS — E785 Hyperlipidemia, unspecified: Secondary | ICD-10-CM

## 2017-04-06 DIAGNOSIS — N200 Calculus of kidney: Secondary | ICD-10-CM | POA: Diagnosis not present

## 2017-04-06 DIAGNOSIS — R7303 Prediabetes: Secondary | ICD-10-CM

## 2017-04-06 DIAGNOSIS — Z0000 Encounter for general adult medical examination without abnormal findings: Secondary | ICD-10-CM

## 2017-04-06 DIAGNOSIS — G4733 Obstructive sleep apnea (adult) (pediatric): Secondary | ICD-10-CM | POA: Diagnosis not present

## 2017-04-06 DIAGNOSIS — Z1329 Encounter for screening for other suspected endocrine disorder: Secondary | ICD-10-CM | POA: Diagnosis not present

## 2017-04-06 DIAGNOSIS — I10 Essential (primary) hypertension: Secondary | ICD-10-CM

## 2017-04-06 DIAGNOSIS — Z9989 Dependence on other enabling machines and devices: Secondary | ICD-10-CM

## 2017-04-06 LAB — URINALYSIS, ROUTINE W REFLEX MICROSCOPIC
BILIRUBIN UA: NEGATIVE
Glucose, UA: NEGATIVE
Ketones, UA: NEGATIVE
Leukocytes, UA: NEGATIVE
NITRITE UA: NEGATIVE
PH UA: 6 (ref 5.0–7.5)
Protein, UA: NEGATIVE
RBC, UA: NEGATIVE
SPEC GRAV UA: 1.02 (ref 1.005–1.030)
Urobilinogen, Ur: 0.2 mg/dL (ref 0.2–1.0)

## 2017-04-06 LAB — BAYER DCA HB A1C WAIVED: HB A1C (BAYER DCA - WAIVED): 5.7 % (ref <7.0)

## 2017-04-06 MED ORDER — ATORVASTATIN CALCIUM 40 MG PO TABS: 40.0000 mg | ORAL_TABLET | Freq: Every day | ORAL | 4 refills | Status: DC

## 2017-04-06 MED ORDER — AMLODIPINE BESYLATE 5 MG PO TABS: 5.0000 mg | ORAL_TABLET | Freq: Every day | ORAL | 4 refills | Status: DC

## 2017-04-06 MED ORDER — LISINOPRIL 40 MG PO TABS: 40.0000 mg | ORAL_TABLET | Freq: Every day | ORAL | 4 refills | Status: DC

## 2017-04-06 MED ORDER — METOPROLOL SUCCINATE ER 50 MG PO TB24: 50.0000 mg | ORAL_TABLET | Freq: Every day | ORAL | 4 refills | Status: DC

## 2017-04-06 NOTE — Assessment & Plan Note (Signed)
Discussed sleep apnea risk benefits of therapy.

## 2017-04-06 NOTE — Progress Notes (Signed)
BP 131/85   Pulse 77   Ht 5' 8.9" (1.75 m)   Wt 205 lb (93 kg)   SpO2 98%   BMI 30.36 kg/m    Subjective:    Patient ID: Craig Ryan, male    DOB: 12-15-1960, 56 y.o.   MRN: [id number]  HPI: [name] is a 56 y.o. male  Annual exam  Patient follow-up blood pressure doing well no complaints from medications takes faithfully. Takes Lipitor without problems no side effects and faithfully. Sleep apnea not using CPAP discussed issues. Patient also with some occasional ED symptoms.  Relevant past medical, surgical, family and social history reviewed and updated as indicated. Interim medical history since our last visit reviewed. Allergies and medications reviewed and updated.  Review of Systems  Constitutional: Negative.   HENT: Negative.   Eyes: Negative.   Respiratory: Negative.   Cardiovascular: Negative.   Gastrointestinal: Negative.   Endocrine: Negative.   Genitourinary: Negative.   Musculoskeletal: Negative.   Skin: Negative.   Allergic/Immunologic: Negative.   Neurological: Negative.   Hematological: Negative.   Psychiatric/Behavioral: Negative.     Per HPI unless specifically indicated above     Objective:    BP 131/85   Pulse 77   Ht 5' 8.9" (1.75 m)   Wt 205 lb (93 kg)   SpO2 98%   BMI 30.36 kg/m   Wt Readings from Last 3 Encounters:  04/06/17 205 lb (93 kg)  10/04/16 204 lb (92.5 kg)  04/01/16 197 lb (89.4 kg)    Physical Exam  Constitutional: He is oriented to person, place, and time. He appears well-developed and well-nourished.  HENT:  Head: Normocephalic and atraumatic.  Right Ear: External ear normal.  Left Ear: External ear normal.  Eyes: Conjunctivae and EOM are normal. Pupils are equal, round, and reactive to light.  Neck: Normal range of motion. Neck supple.  Cardiovascular: Normal rate, regular rhythm, normal heart sounds and intact distal pulses.   Pulmonary/Chest: Effort normal and breath sounds normal.  Abdominal:  Soft. Bowel sounds are normal. There is no splenomegaly or hepatomegaly.  Genitourinary: Rectum normal, prostate normal and penis normal.  Musculoskeletal: Normal range of motion.  Neurological: He is alert and oriented to person, place, and time. He has normal reflexes.  Skin: No rash noted. No erythema.  Psychiatric: He has a normal mood and affect. His behavior is normal. Judgment and thought content normal.    Results for orders placed or performed in visit on 24/26/83  Basic metabolic panel  Result Value Ref Range   Glucose 87 65 - 99 mg/dL   BUN 13 6 - 24 mg/dL   Creatinine, Ser 1.12 0.76 - 1.27 mg/dL   GFR calc non Af Amer 74 >59 mL/min/1.73   GFR calc Af Amer 85 >59 mL/min/1.73   BUN/Creatinine Ratio 12 9 - 20   Sodium 143 134 - 144 mmol/L   Potassium 4.2 3.5 - 5.2 mmol/L   Chloride 101 96 - 106 mmol/L   CO2 24 18 - 29 mmol/L   Calcium 8.9 8.7 - 10.2 mg/dL  LP+ALT+AST Piccolo, Waived  Result Value Ref Range   ALT (SGPT) Piccolo, Waived 37 10 - 47 U/L   AST (SGOT) Piccolo, Waived 33 11 - 38 U/L   Cholesterol Piccolo, Waived 158 <200 mg/dL   HDL Chol Piccolo, Waived 56 (L) >59 mg/dL   Triglycerides Piccolo,Waived 141 <150 mg/dL   Chol/HDL Ratio Piccolo,Waive 2.8 mg/dL   LDL Chol Calc Piccolo  Waived 72 <100 mg/dL   VLDL Chol Calc Piccolo,Waive 28 <30 mg/dL  Bayer DCA Hb A1c Waived  Result Value Ref Range   Bayer DCA Hb A1c Waived 5.5 <7.0 %      Assessment & Plan:   Problem List Items Addressed This Visit      Cardiovascular and Mediastinum   Hypertension    The current medical regimen is effective;  continue present plan and medications.       Relevant Medications   amLODipine (NORVASC) 5 MG tablet   atorvastatin (LIPITOR) 40 MG tablet   lisinopril (PRINIVIL,ZESTRIL) 40 MG tablet   metoprolol succinate (TOPROL-XL) 50 MG 24 hr tablet   Other Relevant Orders   Comprehensive metabolic panel     Respiratory   OSA on CPAP    Discussed sleep apnea risk  benefits of therapy.        Genitourinary   Nephrolithiasis   Relevant Orders   Urinalysis, Routine w reflex microscopic     Other   Hyperlipidemia    The current medical regimen is effective;  continue present plan and medications.       Relevant Medications   amLODipine (NORVASC) 5 MG tablet   atorvastatin (LIPITOR) 40 MG tablet   lisinopril (PRINIVIL,ZESTRIL) 40 MG tablet   metoprolol succinate (TOPROL-XL) 50 MG 24 hr tablet   Other Relevant Orders   Lipid panel   Prediabetes   Relevant Orders   Bayer DCA Hb A1c Waived   Comprehensive metabolic panel    Other Visit Diagnoses    Routine general medical examination at a health care facility    -  Primary   Relevant Orders   Bayer DCA Hb A1c Waived   CBC with Differential/Platelet   Comprehensive metabolic panel   Lipid panel   PSA   TSH   Urinalysis, Routine w reflex microscopic   Thyroid disorder screen       Relevant Orders   TSH     Discussed ED and will observe  Follow up plan: Return for BMP,  Lipids, ALT, AST, Hemoglobin A1c.

## 2017-04-06 NOTE — Assessment & Plan Note (Signed)
The current medical regimen is effective;  continue present plan and medications.  

## 2017-04-07 ENCOUNTER — Encounter: Payer: Self-pay | Admitting: Family Medicine

## 2017-04-07 LAB — COMPREHENSIVE METABOLIC PANEL
A/G RATIO: 1.4 (ref 1.2–2.2)
ALK PHOS: 93 IU/L (ref 39–117)
ALT: 34 IU/L (ref 0–44)
AST: 21 IU/L (ref 0–40)
Albumin: 4.2 g/dL (ref 3.5–5.5)
BILIRUBIN TOTAL: 0.5 mg/dL (ref 0.0–1.2)
BUN/Creatinine Ratio: 16 (ref 9–20)
BUN: 15 mg/dL (ref 6–24)
CHLORIDE: 103 mmol/L (ref 96–106)
CO2: 25 mmol/L (ref 18–29)
Calcium: 9.2 mg/dL (ref 8.7–10.2)
Creatinine, Ser: 0.94 mg/dL (ref 0.76–1.27)
GFR calc Af Amer: 105 mL/min/{1.73_m2} (ref >59)
GFR calc non Af Amer: 91 mL/min/{1.73_m2} (ref >59)
GLUCOSE: 95 mg/dL (ref 65–99)
Globulin, Total: 2.9 g/dL (ref 1.5–4.5)
POTASSIUM: 4 mmol/L (ref 3.5–5.2)
Sodium: 142 mmol/L (ref 134–144)
Total Protein: 7.1 g/dL (ref 6.0–8.5)

## 2017-04-07 LAB — CBC WITH DIFFERENTIAL/PLATELET
Basophils Absolute: 0 10*3/uL (ref 0.0–0.2)
Basos: 0 %
EOS (ABSOLUTE): 0.2 10*3/uL (ref 0.0–0.4)
EOS: 2 %
HEMATOCRIT: 48.9 % (ref 37.5–51.0)
HEMOGLOBIN: 17 g/dL (ref 13.0–17.7)
IMMATURE GRANS (ABS): 0 10*3/uL (ref 0.0–0.1)
IMMATURE GRANULOCYTES: 0 %
LYMPHS ABS: 1.8 10*3/uL (ref 0.7–3.1)
LYMPHS: 26 %
MCH: 31.6 pg (ref 26.6–33.0)
MCHC: 34.8 g/dL (ref 31.5–35.7)
MCV: 91 fL (ref 79–97)
MONOCYTES: 8 %
Monocytes Absolute: 0.5 10*3/uL (ref 0.1–0.9)
Neutrophils Absolute: 4.5 10*3/uL (ref 1.4–7.0)
Neutrophils: 64 %
PLATELETS: 244 10*3/uL (ref 150–379)
RBC: 5.38 x10E6/uL (ref 4.14–5.80)
RDW: 13.9 % (ref 12.3–15.4)
WBC: 7 10*3/uL (ref 3.4–10.8)

## 2017-04-07 LAB — LIPID PANEL
CHOLESTEROL TOTAL: 159 mg/dL (ref 100–199)
Chol/HDL Ratio: 3.2 ratio (ref 0.0–5.0)
HDL: 49 mg/dL (ref >39)
LDL Calculated: 84 mg/dL (ref 0–99)
TRIGLYCERIDES: 131 mg/dL (ref 0–149)
VLDL Cholesterol Cal: 26 mg/dL (ref 5–40)

## 2017-04-07 LAB — TSH: TSH: 0.98 u[IU]/mL (ref 0.450–4.500)

## 2017-04-07 LAB — PSA: PROSTATE SPECIFIC AG, SERUM: 1.1 ng/mL (ref 0.0–4.0)

## 2017-10-11 ENCOUNTER — Ambulatory Visit: Payer: 59 | Admitting: Family Medicine

## 2017-10-11 ENCOUNTER — Encounter: Payer: Self-pay | Admitting: Family Medicine

## 2017-10-11 VITALS — BP 138/86 | HR 71 | Wt 208.0 lb

## 2017-10-11 DIAGNOSIS — I10 Essential (primary) hypertension: Secondary | ICD-10-CM | POA: Diagnosis not present

## 2017-10-11 DIAGNOSIS — Z9989 Dependence on other enabling machines and devices: Secondary | ICD-10-CM

## 2017-10-11 DIAGNOSIS — T7840XA Allergy, unspecified, initial encounter: Secondary | ICD-10-CM | POA: Diagnosis not present

## 2017-10-11 DIAGNOSIS — G4733 Obstructive sleep apnea (adult) (pediatric): Secondary | ICD-10-CM | POA: Diagnosis not present

## 2017-10-11 DIAGNOSIS — R7303 Prediabetes: Secondary | ICD-10-CM

## 2017-10-11 DIAGNOSIS — N200 Calculus of kidney: Secondary | ICD-10-CM

## 2017-10-11 DIAGNOSIS — E785 Hyperlipidemia, unspecified: Secondary | ICD-10-CM | POA: Diagnosis not present

## 2017-10-11 LAB — LP+ALT+AST PICCOLO, WAIVED
ALT (SGPT) Piccolo, Waived: 30 U/L (ref 10–47)
AST (SGOT) Piccolo, Waived: 32 U/L (ref 11–38)
Chol/HDL Ratio Piccolo,Waive: 2.7 mg/dL
Cholesterol Piccolo, Waived: 148 mg/dL (ref <200)
HDL Chol Piccolo, Waived: 54 mg/dL — ABNORMAL LOW (ref >59)
LDL CHOL CALC PICCOLO WAIVED: 70 mg/dL (ref <100)
Triglycerides Piccolo,Waived: 115 mg/dL (ref <150)
VLDL Chol Calc Piccolo,Waive: 23 mg/dL (ref <30)

## 2017-10-11 LAB — BAYER DCA HB A1C WAIVED: HB A1C (BAYER DCA - WAIVED): 5.4 % (ref <7.0)

## 2017-10-11 NOTE — Assessment & Plan Note (Addendum)
The current medical regimen is effective;  continue present plan and medications. Discussed lifestyle for further control

## 2017-10-11 NOTE — Assessment & Plan Note (Signed)
Discuss allergy care and tx no further w/u

## 2017-10-11 NOTE — Assessment & Plan Note (Signed)
The current medical regimen is effective;  continue present plan and medications.  

## 2017-10-11 NOTE — Progress Notes (Signed)
BP 138/86 (BP Location: Left Arm)   Pulse 71   Wt 208 lb (94.3 kg)   SpO2 99%   BMI 30.81 kg/m    Subjective:    Patient ID: Craig Ryan, male    DOB: [birth year], 56 y.o.   MRN: [id number]  HPI: Craig Ryan is a 57 y.o. male  Chief Complaint  Patient presents with  . Follow-up  patient follow-up has recently passed a kidney stone.And just was at urologist office this summer  With no kidney stones identifiedPatient also with allergies self tng with Zyrtec an seems to be doing okay Blood pressure doing okay on medications no complaints. Reviewed that patient's been borderline elevated blood pressure  Relevant past medical, surgical, family and social history reviewed and updated as indicated. Interim medical history since our last visit reviewed. Allergies and medications reviewed and updated.  Review of Systems  Constitutional: Negative.   Respiratory: Negative.   Cardiovascular: Negative.     Per HPI unless specifically indicated above     Objective:    BP 138/86 (BP Location: Left Arm)   Pulse 71   Wt 208 lb (94.3 kg)   SpO2 99%   BMI 30.81 kg/m   Wt Readings from Last 3 Encounters:  10/11/17 208 lb (94.3 kg)  04/06/17 205 lb (93 kg)  10/04/16 204 lb (92.5 kg)    Physical Exam  Constitutional: He is oriented to person, place, and time. He appears well-developed and well-nourished.  HENT:  Head: Normocephalic and atraumatic.  Eyes: Conjunctivae and EOM are normal.  Neck: Normal range of motion.  Cardiovascular: Normal rate, regular rhythm and normal heart sounds.  Pulmonary/Chest: Effort normal and breath sounds normal.  Musculoskeletal: Normal range of motion.  Neurological: He is alert and oriented to person, place, and time.  Skin: No erythema.  Psychiatric: He has a normal mood and affect. His behavior is normal. Judgment and thought content normal.    Results for orders placed or performed in visit on 04/06/17  Bayer DCA Hb A1c Waived    Result Value Ref Range   Bayer DCA Hb A1c Waived 5.7 <7.0 %  CBC with Differential/Platelet  Result Value Ref Range   WBC 7.0 3.4 - 10.8 x10E3/uL   RBC 5.38 4.14 - 5.80 x10E6/uL   Hemoglobin 17.0 13.0 - 17.7 g/dL   Hematocrit 48.9 37.5 - 51.0 %   MCV 91 79 - 97 fL   MCH 31.6 26.6 - 33.0 pg   MCHC 34.8 31.5 - 35.7 g/dL   RDW 13.9 12.3 - 15.4 %   Platelets 244 150 - 379 x10E3/uL   Neutrophils 64 Not Estab. %   Lymphs 26 Not Estab. %   Monocytes 8 Not Estab. %   Eos 2 Not Estab. %   Basos 0 Not Estab. %   Neutrophils Absolute 4.5 1.4 - 7.0 x10E3/uL   Lymphocytes Absolute 1.8 0.7 - 3.1 x10E3/uL   Monocytes Absolute 0.5 0.1 - 0.9 x10E3/uL   EOS (ABSOLUTE) 0.2 0.0 - 0.4 x10E3/uL   Basophils Absolute 0.0 0.0 - 0.2 x10E3/uL   Immature Granulocytes 0 Not Estab. %   Immature Grans (Abs) 0.0 0.0 - 0.1 x10E3/uL  Comprehensive metabolic panel  Result Value Ref Range   Glucose 95 65 - 99 mg/dL   BUN 15 6 - 24 mg/dL   Creatinine, Ser 0.94 0.76 - 1.27 mg/dL   GFR calc non Af Amer 91 >59 mL/min/1.73   GFR calc Af Amer 105 >59  mL/min/1.73   BUN/Creatinine Ratio 16 9 - 20   Sodium 142 134 - 144 mmol/L   Potassium 4.0 3.5 - 5.2 mmol/L   Chloride 103 96 - 106 mmol/L   CO2 25 18 - 29 mmol/L   Calcium 9.2 8.7 - 10.2 mg/dL   Total Protein 7.1 6.0 - 8.5 g/dL   Albumin 4.2 3.5 - 5.5 g/dL   Globulin, Total 2.9 1.5 - 4.5 g/dL   Albumin/Globulin Ratio 1.4 1.2 - 2.2   Bilirubin Total 0.5 0.0 - 1.2 mg/dL   Alkaline Phosphatase 93 39 - 117 IU/L   AST 21 0 - 40 IU/L   ALT 34 0 - 44 IU/L  Lipid panel  Result Value Ref Range   Cholesterol, Total 159 100 - 199 mg/dL   Triglycerides 131 0 - 149 mg/dL   HDL 49 >39 mg/dL   VLDL Cholesterol Cal 26 5 - 40 mg/dL   LDL Calculated 84 0 - 99 mg/dL   Chol/HDL Ratio 3.2 0.0 - 5.0 ratio  PSA  Result Value Ref Range   Prostate Specific Ag, Serum 1.1 0.0 - 4.0 ng/mL  TSH  Result Value Ref Range   TSH 0.980 0.450 - 4.500 uIU/mL  Urinalysis, Routine w  reflex microscopic  Result Value Ref Range   Specific Gravity, UA 1.020 1.005 - 1.030   pH, UA 6.0 5.0 - 7.5   Color, UA Yellow Yellow   Appearance Ur Clear Clear   Leukocytes, UA Negative Negative   Protein, UA Negative Negative/Trace   Glucose, UA Negative Negative   Ketones, UA Negative Negative   RBC, UA Negative Negative   Bilirubin, UA Negative Negative   Urobilinogen, Ur 0.2 0.2 - 1.0 mg/dL   Nitrite, UA Negative Negative      Assessment & Plan:   Problem List Items Addressed This Visit      Cardiovascular and Mediastinum   Hypertension - Primary    The current medical regimen is effective;  continue present plan and medications. Discussed lifestyle for further control       Relevant Orders   Basic metabolic panel   LP+ALT+AST Piccolo, Waived   Bayer DCA Hb A1c Waived     Respiratory   OSA on CPAP    The current medical regimen is effective;  continue present plan and medications.         Genitourinary   Nephrolithiasis    The current medical regimen is effective;  continue present plan and medications.         Other   Hyperlipidemia    The current medical regimen is effective;  continue present plan and medications.       Relevant Orders   Basic metabolic panel   LP+ALT+AST Piccolo, Waived   Bayer DCA Hb A1c Waived   Allergy    Discuss allergy care and tx no further w/u      Prediabetes   Relevant Orders   Basic metabolic panel   LP+ALT+AST Piccolo, Waived   Bayer DCA Hb A1c Waived       Follow up plan: Return in about 6 months (around 04/11/2018) for Physical Exam.

## 2017-10-12 ENCOUNTER — Encounter: Payer: Self-pay | Admitting: Family Medicine

## 2017-10-12 LAB — BASIC METABOLIC PANEL
BUN/Creatinine Ratio: 14 (ref 9–20)
BUN: 16 mg/dL (ref 6–24)
CHLORIDE: 103 mmol/L (ref 96–106)
CO2: 25 mmol/L (ref 20–29)
Calcium: 9.2 mg/dL (ref 8.7–10.2)
Creatinine, Ser: 1.11 mg/dL (ref 0.76–1.27)
GFR calc Af Amer: 85 mL/min/{1.73_m2} (ref >59)
GFR calc non Af Amer: 74 mL/min/{1.73_m2} (ref >59)
GLUCOSE: 87 mg/dL (ref 65–99)
POTASSIUM: 4.4 mmol/L (ref 3.5–5.2)
SODIUM: 144 mmol/L (ref 134–144)

## 2017-11-12 DIAGNOSIS — J988 Other specified respiratory disorders: Secondary | ICD-10-CM | POA: Diagnosis not present

## 2017-11-12 DIAGNOSIS — H6981 Other specified disorders of Eustachian tube, right ear: Secondary | ICD-10-CM | POA: Diagnosis not present

## 2018-02-03 ENCOUNTER — Ambulatory Visit: Payer: BLUE CROSS/BLUE SHIELD | Admitting: Family Medicine

## 2018-02-03 ENCOUNTER — Encounter: Payer: Self-pay | Admitting: Family Medicine

## 2018-02-03 VITALS — BP 145/71 | HR 75 | Temp 98.1°F | Wt 210.8 lb

## 2018-02-03 DIAGNOSIS — E785 Hyperlipidemia, unspecified: Secondary | ICD-10-CM | POA: Diagnosis not present

## 2018-02-03 DIAGNOSIS — I10 Essential (primary) hypertension: Secondary | ICD-10-CM | POA: Diagnosis not present

## 2018-02-03 DIAGNOSIS — J0100 Acute maxillary sinusitis, unspecified: Secondary | ICD-10-CM | POA: Diagnosis not present

## 2018-02-03 MED ORDER — AMOXICILLIN-POT CLAVULANATE 875-125 MG PO TABS: 1.0000 | ORAL_TABLET | Freq: Two times a day (BID) | ORAL | 0 refills | Status: DC

## 2018-02-03 MED ORDER — ATORVASTATIN CALCIUM 40 MG PO TABS: 40.0000 mg | ORAL_TABLET | Freq: Every day | ORAL | 4 refills | Status: DC

## 2018-02-03 MED ORDER — METOPROLOL SUCCINATE ER 50 MG PO TB24: 50.0000 mg | ORAL_TABLET | Freq: Every day | ORAL | 4 refills | Status: DC

## 2018-02-03 MED ORDER — LISINOPRIL 40 MG PO TABS: 40.0000 mg | ORAL_TABLET | Freq: Every day | ORAL | 4 refills | Status: DC

## 2018-02-03 MED ORDER — PREDNISONE 20 MG PO TABS: 40.0000 mg | ORAL_TABLET | Freq: Every day | ORAL | 0 refills | Status: DC

## 2018-02-03 MED ORDER — AMLODIPINE BESYLATE 5 MG PO TABS
5.0000 mg | ORAL_TABLET | Freq: Every day | ORAL | 4 refills | Status: DC
Start: 2018-02-03 — End: 2018-04-26

## 2018-02-03 NOTE — Assessment & Plan Note (Signed)
BP mildly elevated today but pt ill, home readings WNL. Refilled medications, continue current regimen

## 2018-02-03 NOTE — Progress Notes (Signed)
BP (!) 145/71 (BP Location: Right Arm, Patient Position: Sitting, Cuff Size: Normal)   Pulse 75   Temp 98.1 F (36.7 C) (Oral)   Wt 210 lb 12.8 oz (95.6 kg)   SpO2 98%   BMI 31.22 kg/m    Subjective:    Patient ID: Craig Ryan, male    DOB: May 06, 1961, 57 y.o.   MRN: [id number]  HPI: Craig Ryan is a 57 y.o. male  Chief Complaint  Patient presents with  . Sinusitis    x's 5 days  . Nasal Congestion  . Sore Throat  . Ear Fullness    Right Ear   Right sided ear and facial pain, sore throat, congestion, productive cough x 5-6 days. Worsening the past 2 days. Was seen at a walk in for similar issue in January and given prednisone and atrovent nasal spray with some temporary relief. Takes zyrtec daily for allergies. Has nasonex at home but has not used it. Denies fever, chills, CP, SOB but does have some chest tightness. No known hx of pulmonary dz, non-smoker.   BPs WNL when checked at home, doing well on medications. No side effects noted. No CP, SOB, syncope, HAs. Compliant with current regimen, needing refills to a new pharmacy.   Doing well on lipitor, no myalgias, fatigue, abdominal pain, CP. Last checked 10/2017.   Past Medical History:  Diagnosis Date  . Allergy   . History of shingles   . Hyperlipidemia   . Hypertension   . Nephrolithiasis   . OSA on CPAP    Social History   Socioeconomic History  . Marital status: Married    Spouse name: Not on file  . Number of children: Not on file  . Years of education: Not on file  . Highest education level: Not on file  Occupational History  . Not on file  Social Needs  . Financial resource strain: Not on file  . Food insecurity:    Worry: Not on file    Inability: Not on file  . Transportation needs:    Medical: Not on file    Non-medical: Not on file  Tobacco Use  . Smoking status: Never Smoker  . Smokeless tobacco: Never Used  Substance and Sexual Activity  . Alcohol use: No  . Drug use: No  .  Sexual activity: Not on file  Lifestyle  . Physical activity:    Days per week: Not on file    Minutes per session: Not on file  . Stress: Not on file  Relationships  . Social connections:    Talks on phone: Not on file    Gets together: Not on file    Attends religious service: Not on file    Active member of club or organization: Not on file    Attends meetings of clubs or organizations: Not on file    Relationship status: Not on file  . Intimate partner violence:    Fear of current or ex partner: Not on file    Emotionally abused: Not on file    Physically abused: Not on file    Forced sexual activity: Not on file  Other Topics Concern  . Not on file  Social History Narrative  . Not on file   Relevant past medical, surgical, family and social history reviewed and updated as indicated. Interim medical history since our last visit reviewed. Allergies and medications reviewed and updated.  Review of Systems  Per HPI unless specifically indicated above  Objective:    BP (!) 145/71 (BP Location: Right Arm, Patient Position: Sitting, Cuff Size: Normal)   Pulse 75   Temp 98.1 F (36.7 C) (Oral)   Wt 210 lb 12.8 oz (95.6 kg)   SpO2 98%   BMI 31.22 kg/m   Wt Readings from Last 3 Encounters:  02/03/18 210 lb 12.8 oz (95.6 kg)  10/11/17 208 lb (94.3 kg)  04/06/17 205 lb (93 kg)    Physical Exam  Constitutional: He is oriented to person, place, and time. He appears well-developed and well-nourished. No distress.  HENT:  Head: Atraumatic.  B/l middle ear effusion Oropharynx and nasal mucosa erythematous and mildly edematous, discharge present  Eyes: Pupils are equal, round, and reactive to light. Conjunctivae are normal.  Neck: Normal range of motion. Neck supple.  Cardiovascular: Normal rate and normal heart sounds.  Pulmonary/Chest: Effort normal. No respiratory distress. He has wheezes (mild diffuse).  Musculoskeletal: Normal range of motion.  Lymphadenopathy:     He has no cervical adenopathy.  Neurological: He is alert and oriented to person, place, and time.  Skin: Skin is warm and dry.  Psychiatric: He has a normal mood and affect. His behavior is normal.  Nursing note and vitals reviewed.   Results for orders placed or performed in visit on 48/54/62  Basic metabolic panel  Result Value Ref Range   Glucose 87 65 - 99 mg/dL   BUN 16 6 - 24 mg/dL   Creatinine, Ser 1.11 0.76 - 1.27 mg/dL   GFR calc non Af Amer 74 >59 mL/min/1.73   GFR calc Af Amer 85 >59 mL/min/1.73   BUN/Creatinine Ratio 14 9 - 20   Sodium 144 134 - 144 mmol/L   Potassium 4.4 3.5 - 5.2 mmol/L   Chloride 103 96 - 106 mmol/L   CO2 25 20 - 29 mmol/L   Calcium 9.2 8.7 - 10.2 mg/dL  LP+ALT+AST Piccolo, Waived  Result Value Ref Range   ALT (SGPT) Piccolo, Waived 30 10 - 47 U/L   AST (SGOT) Piccolo, Waived 32 11 - 38 U/L   Cholesterol Piccolo, Waived 148 <200 mg/dL   HDL Chol Piccolo, Waived 54 (L) >59 mg/dL   Triglycerides Piccolo,Waived 115 <150 mg/dL   Chol/HDL Ratio Piccolo,Waive 2.7 mg/dL   LDL Chol Calc Piccolo Waived 70 <100 mg/dL   VLDL Chol Calc Piccolo,Waive 23 <30 mg/dL  Bayer DCA Hb A1c Waived  Result Value Ref Range   Bayer DCA Hb A1c Waived 5.4 <7.0 %      Assessment & Plan:   Problem List Items Addressed This Visit      Cardiovascular and Mediastinum   Hypertension    BP mildly elevated today but pt ill, home readings WNL. Refilled medications, continue current regimen      Relevant Medications   amLODipine (NORVASC) 5 MG tablet   atorvastatin (LIPITOR) 40 MG tablet   lisinopril (PRINIVIL,ZESTRIL) 40 MG tablet   metoprolol succinate (TOPROL-XL) 50 MG 24 hr tablet     Other   Hyperlipidemia    Not yet due for labs, will refill medications. Stable, continue current regimen.       Relevant Medications   amLODipine (NORVASC) 5 MG tablet   atorvastatin (LIPITOR) 40 MG tablet   lisinopril (PRINIVIL,ZESTRIL) 40 MG tablet   metoprolol succinate  (TOPROL-XL) 50 MG 24 hr tablet    Other Visit Diagnoses    Acute maxillary sinusitis, recurrence not specified    -  Primary   Will tx  with sinus rinses, prednisone, humidifier, mucinex, zyrtec and flonase regimen. Augmentin sent in case not improving over the next 2-3 days   Relevant Medications   ipratropium (ATROVENT) 0.03 % nasal spray   predniSONE (DELTASONE) 20 MG tablet   amoxicillin-clavulanate (AUGMENTIN) 875-125 MG tablet       Follow up plan: Return in about 3 months (around 05/06/2018) for as scheduled.

## 2018-02-03 NOTE — Patient Instructions (Signed)
Zyrtec, plain mucinex, and nasonex twice a daily during symptom flare Try the prednisone, humidifier, and sinus rinses for 2-3 days, if no improvement start augmentin.

## 2018-02-03 NOTE — Assessment & Plan Note (Signed)
Not yet due for labs, will refill medications. Stable, continue current regimen.

## 2018-03-07 DIAGNOSIS — L821 Other seborrheic keratosis: Secondary | ICD-10-CM | POA: Diagnosis not present

## 2018-03-07 DIAGNOSIS — L905 Scar conditions and fibrosis of skin: Secondary | ICD-10-CM | POA: Diagnosis not present

## 2018-03-07 DIAGNOSIS — D485 Neoplasm of uncertain behavior of skin: Secondary | ICD-10-CM | POA: Diagnosis not present

## 2018-03-07 DIAGNOSIS — D225 Melanocytic nevi of trunk: Secondary | ICD-10-CM | POA: Diagnosis not present

## 2018-03-07 DIAGNOSIS — L570 Actinic keratosis: Secondary | ICD-10-CM | POA: Diagnosis not present

## 2018-03-07 DIAGNOSIS — Z85828 Personal history of other malignant neoplasm of skin: Secondary | ICD-10-CM | POA: Diagnosis not present

## 2018-04-19 ENCOUNTER — Encounter (INDEPENDENT_AMBULATORY_CARE_PROVIDER_SITE_OTHER): Payer: BLUE CROSS/BLUE SHIELD | Admitting: Ophthalmology

## 2018-04-19 DIAGNOSIS — H35033 Hypertensive retinopathy, bilateral: Secondary | ICD-10-CM

## 2018-04-19 DIAGNOSIS — H2513 Age-related nuclear cataract, bilateral: Secondary | ICD-10-CM

## 2018-04-19 DIAGNOSIS — I10 Essential (primary) hypertension: Secondary | ICD-10-CM | POA: Diagnosis not present

## 2018-04-19 DIAGNOSIS — H35073 Retinal telangiectasis, bilateral: Secondary | ICD-10-CM

## 2018-04-19 DIAGNOSIS — H3563 Retinal hemorrhage, bilateral: Secondary | ICD-10-CM

## 2018-04-19 DIAGNOSIS — H43813 Vitreous degeneration, bilateral: Secondary | ICD-10-CM

## 2018-04-26 ENCOUNTER — Encounter: Payer: Self-pay | Admitting: Family Medicine

## 2018-04-26 ENCOUNTER — Ambulatory Visit (INDEPENDENT_AMBULATORY_CARE_PROVIDER_SITE_OTHER): Payer: BLUE CROSS/BLUE SHIELD | Admitting: Family Medicine

## 2018-04-26 VITALS — BP 138/80 | HR 76 | Ht 68.5 in | Wt 210.0 lb

## 2018-04-26 DIAGNOSIS — Z114 Encounter for screening for human immunodeficiency virus [HIV]: Secondary | ICD-10-CM

## 2018-04-26 DIAGNOSIS — I10 Essential (primary) hypertension: Secondary | ICD-10-CM

## 2018-04-26 DIAGNOSIS — Z125 Encounter for screening for malignant neoplasm of prostate: Secondary | ICD-10-CM

## 2018-04-26 DIAGNOSIS — R7303 Prediabetes: Secondary | ICD-10-CM | POA: Diagnosis not present

## 2018-04-26 DIAGNOSIS — Z0001 Encounter for general adult medical examination with abnormal findings: Secondary | ICD-10-CM

## 2018-04-26 DIAGNOSIS — G4733 Obstructive sleep apnea (adult) (pediatric): Secondary | ICD-10-CM

## 2018-04-26 DIAGNOSIS — Z1329 Encounter for screening for other suspected endocrine disorder: Secondary | ICD-10-CM

## 2018-04-26 DIAGNOSIS — H35073 Retinal telangiectasis, bilateral: Secondary | ICD-10-CM | POA: Diagnosis not present

## 2018-04-26 DIAGNOSIS — Z9989 Dependence on other enabling machines and devices: Secondary | ICD-10-CM

## 2018-04-26 DIAGNOSIS — E785 Hyperlipidemia, unspecified: Secondary | ICD-10-CM | POA: Diagnosis not present

## 2018-04-26 DIAGNOSIS — H35053 Retinal neovascularization, unspecified, bilateral: Secondary | ICD-10-CM | POA: Insufficient documentation

## 2018-04-26 DIAGNOSIS — N200 Calculus of kidney: Secondary | ICD-10-CM

## 2018-04-26 LAB — URINALYSIS, ROUTINE W REFLEX MICROSCOPIC
BILIRUBIN UA: NEGATIVE
GLUCOSE, UA: NEGATIVE
KETONES UA: NEGATIVE
Leukocytes, UA: NEGATIVE
NITRITE UA: NEGATIVE
PROTEIN UA: NEGATIVE
Specific Gravity, UA: 1.02 (ref 1.005–1.030)
Urobilinogen, Ur: 0.2 mg/dL (ref 0.2–1.0)
pH, UA: 6 (ref 5.0–7.5)

## 2018-04-26 LAB — MICROSCOPIC EXAMINATION: Bacteria, UA: NONE SEEN

## 2018-04-26 MED ORDER — LISINOPRIL 40 MG PO TABS: 40.0000 mg | ORAL_TABLET | Freq: Every day | ORAL | 4 refills | Status: DC

## 2018-04-26 MED ORDER — METOPROLOL SUCCINATE ER 50 MG PO TB24: 50.0000 mg | ORAL_TABLET | Freq: Every day | ORAL | 4 refills | Status: DC

## 2018-04-26 MED ORDER — ATORVASTATIN CALCIUM 40 MG PO TABS: 40.0000 mg | ORAL_TABLET | Freq: Every day | ORAL | 4 refills | Status: DC

## 2018-04-26 MED ORDER — AMLODIPINE BESYLATE 5 MG PO TABS: 5.0000 mg | ORAL_TABLET | Freq: Every day | ORAL | 4 refills | Status: DC

## 2018-04-26 NOTE — Assessment & Plan Note (Signed)
The current medical regimen is effective;  continue present plan and medications.  

## 2018-04-26 NOTE — Assessment & Plan Note (Signed)
Discussed sleep apnea mask sizing and various masks.  Patient not using faithfully discussed the importance of using faithfully and health issues.

## 2018-04-26 NOTE — Progress Notes (Signed)
BP 138/80 (BP Location: Left Arm)   Pulse 76   Ht 5' 8.5" (1.74 m)   Wt 210 lb (95.3 kg)   SpO2 98%   BMI 31.46 kg/m    Subjective:    Patient ID: Craig Ryan, male    DOB: Apr 27, 1961, 57 y.o.   MRN: [id number]  HPI: Craig Ryan is a 57 y.o. male  Chief Complaint  Patient presents with  . Annual Exam  . Foot Problem    Feels like bricks when first gets up   Pt. is all in all doing well has been diagnosed with macular telangiectasia and being followed by ophthalmology.  Has not had visual complaints yet.  Has stopped nasal steroids due to ocular problems and allergies doing okay continues to take Zyrtec. Patient also very stiff when he first gets up in the morning reviewed stiffness does work out during the day.  Especially with activity.  Does not last very long with activity. Blood pressure good control no complaints from medications takes faithfully. Same with atorvastatin no problems.  Relevant past medical, surgical, family and social history reviewed and updated as indicated. Interim medical history since our last visit reviewed. Allergies and medications reviewed and updated.  Review of Systems  Constitutional: Negative.   HENT: Negative.   Eyes: Negative.   Respiratory: Negative.   Cardiovascular: Negative.   Gastrointestinal: Negative.   Endocrine: Negative.   Genitourinary: Negative.   Musculoskeletal: Negative.   Skin: Negative.   Allergic/Immunologic: Negative.   Neurological: Negative.   Hematological: Negative.   Psychiatric/Behavioral: Negative.     Per HPI unless specifically indicated above     Objective:    BP 138/80 (BP Location: Left Arm)   Pulse 76   Ht 5' 8.5" (1.74 m)   Wt 210 lb (95.3 kg)   SpO2 98%   BMI 31.46 kg/m   Wt Readings from Last 3 Encounters:  04/26/18 210 lb (95.3 kg)  02/03/18 210 lb 12.8 oz (95.6 kg)  10/11/17 208 lb (94.3 kg)    Physical Exam  Constitutional: He is oriented to person, place, and time. He  appears well-developed and well-nourished.  HENT:  Head: Normocephalic and atraumatic.  Right Ear: External ear normal.  Left Ear: External ear normal.  Eyes: Pupils are equal, round, and reactive to light. Conjunctivae and EOM are normal.  Neck: Normal range of motion. Neck supple.  Cardiovascular: Normal rate, regular rhythm, normal heart sounds and intact distal pulses.  Pulmonary/Chest: Effort normal and breath sounds normal.  Abdominal: Soft. Bowel sounds are normal. There is no splenomegaly or hepatomegaly.  Genitourinary: Rectum normal, prostate normal and penis normal.  Musculoskeletal: Normal range of motion.  Neurological: He is alert and oriented to person, place, and time. He has normal reflexes.  Skin: No rash noted. No erythema.  Psychiatric: He has a normal mood and affect. His behavior is normal. Judgment and thought content normal.    Results for orders placed or performed in visit on 59/16/38  Basic metabolic panel  Result Value Ref Range   Glucose 87 65 - 99 mg/dL   BUN 16 6 - 24 mg/dL   Creatinine, Ser 1.11 0.76 - 1.27 mg/dL   GFR calc non Af Amer 74 >59 mL/min/1.73   GFR calc Af Amer 85 >59 mL/min/1.73   BUN/Creatinine Ratio 14 9 - 20   Sodium 144 134 - 144 mmol/L   Potassium 4.4 3.5 - 5.2 mmol/L   Chloride 103 96 - 106  mmol/L   CO2 25 20 - 29 mmol/L   Calcium 9.2 8.7 - 10.2 mg/dL  LP+ALT+AST Piccolo, Waived  Result Value Ref Range   ALT (SGPT) Piccolo, Waived 30 10 - 47 U/L   AST (SGOT) Piccolo, Waived 32 11 - 38 U/L   Cholesterol Piccolo, Waived 148 <200 mg/dL   HDL Chol Piccolo, Waived 54 (L) >59 mg/dL   Triglycerides Piccolo,Waived 115 <150 mg/dL   Chol/HDL Ratio Piccolo,Waive 2.7 mg/dL   LDL Chol Calc Piccolo Waived 70 <100 mg/dL   VLDL Chol Calc Piccolo,Waive 23 <30 mg/dL  Bayer DCA Hb A1c Waived  Result Value Ref Range   HB A1C (BAYER DCA - WAIVED) 5.4 <7.0 %      Assessment & Plan:   Problem List Items Addressed This Visit       Cardiovascular and Mediastinum   Hypertension - Primary    The current medical regimen is effective;  continue present plan and medications.       Relevant Medications   metoprolol succinate (TOPROL-XL) 50 MG 24 hr tablet   lisinopril (PRINIVIL,ZESTRIL) 40 MG tablet   atorvastatin (LIPITOR) 40 MG tablet   amLODipine (NORVASC) 5 MG tablet   Other Relevant Orders   CBC with Differential/Platelet   Comprehensive metabolic panel   Lipid panel     Respiratory   OSA on CPAP    Discussed sleep apnea mask sizing and various masks.  Patient not using faithfully discussed the importance of using faithfully and health issues.        Genitourinary   Nephrolithiasis   Relevant Orders   Comprehensive metabolic panel   Lipid panel     Other   Hyperlipidemia    The current medical regimen is effective;  continue present plan and medications.       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

## 2018-04-27 ENCOUNTER — Encounter: Payer: Self-pay | Admitting: Family Medicine

## 2018-04-27 LAB — COMPREHENSIVE METABOLIC PANEL
A/G RATIO: 1.4 (ref 1.2–2.2)
ALBUMIN: 4.2 g/dL (ref 3.5–5.5)
ALK PHOS: 96 IU/L (ref 39–117)
ALT: 27 IU/L (ref 0–44)
AST: 18 IU/L (ref 0–40)
BILIRUBIN TOTAL: 0.4 mg/dL (ref 0.0–1.2)
BUN / CREAT RATIO: 13 (ref 9–20)
BUN: 14 mg/dL (ref 6–24)
CO2: 23 mmol/L (ref 20–29)
Calcium: 9.1 mg/dL (ref 8.7–10.2)
Chloride: 105 mmol/L (ref 96–106)
Creatinine, Ser: 1.06 mg/dL (ref 0.76–1.27)
GFR calc Af Amer: 90 mL/min/{1.73_m2} (ref >59)
GFR calc non Af Amer: 78 mL/min/{1.73_m2} (ref >59)
Globulin, Total: 3.1 g/dL (ref 1.5–4.5)
Glucose: 91 mg/dL (ref 65–99)
POTASSIUM: 3.9 mmol/L (ref 3.5–5.2)
SODIUM: 144 mmol/L (ref 134–144)
Total Protein: 7.3 g/dL (ref 6.0–8.5)

## 2018-04-27 LAB — CBC WITH DIFFERENTIAL/PLATELET
BASOS: 1 %
Basophils Absolute: 0.1 10*3/uL (ref 0.0–0.2)
EOS (ABSOLUTE): 0.2 10*3/uL (ref 0.0–0.4)
EOS: 2 %
HEMATOCRIT: 46.6 % (ref 37.5–51.0)
HEMOGLOBIN: 15.9 g/dL (ref 13.0–17.7)
Immature Grans (Abs): 0 10*3/uL (ref 0.0–0.1)
Immature Granulocytes: 0 %
LYMPHS ABS: 1.8 10*3/uL (ref 0.7–3.1)
Lymphs: 23 %
MCH: 30.3 pg (ref 26.6–33.0)
MCHC: 34.1 g/dL (ref 31.5–35.7)
MCV: 89 fL (ref 79–97)
MONOS ABS: 0.7 10*3/uL (ref 0.1–0.9)
Monocytes: 9 %
NEUTROS ABS: 5.3 10*3/uL (ref 1.4–7.0)
Neutrophils: 65 %
Platelets: 246 10*3/uL (ref 150–450)
RBC: 5.24 x10E6/uL (ref 4.14–5.80)
RDW: 13.8 % (ref 12.3–15.4)
WBC: 8 10*3/uL (ref 3.4–10.8)

## 2018-04-27 LAB — HIV ANTIBODY (ROUTINE TESTING W REFLEX): HIV SCREEN 4TH GENERATION: NONREACTIVE

## 2018-04-27 LAB — LIPID PANEL
CHOLESTEROL TOTAL: 148 mg/dL (ref 100–199)
Chol/HDL Ratio: 2.7 ratio (ref 0.0–5.0)
HDL: 54 mg/dL (ref >39)
LDL Calculated: 76 mg/dL (ref 0–99)
TRIGLYCERIDES: 90 mg/dL (ref 0–149)
VLDL Cholesterol Cal: 18 mg/dL (ref 5–40)

## 2018-04-27 LAB — PSA: Prostate Specific Ag, Serum: 1.8 ng/mL (ref 0.0–4.0)

## 2018-04-27 LAB — TSH: TSH: 0.921 u[IU]/mL (ref 0.450–4.500)

## 2018-06-01 DIAGNOSIS — H2513 Age-related nuclear cataract, bilateral: Secondary | ICD-10-CM | POA: Diagnosis not present

## 2018-06-01 DIAGNOSIS — H35073 Retinal telangiectasis, bilateral: Secondary | ICD-10-CM | POA: Diagnosis not present

## 2018-10-19 ENCOUNTER — Encounter (INDEPENDENT_AMBULATORY_CARE_PROVIDER_SITE_OTHER): Payer: BLUE CROSS/BLUE SHIELD | Admitting: Ophthalmology

## 2018-10-24 ENCOUNTER — Ambulatory Visit: Payer: Self-pay

## 2018-10-24 NOTE — Telephone Encounter (Signed)
Patient called in with c/o "hip/leg pain." He says "I just want to know what else can be done until my appointment on Thursday. The pain is in my right hip and goes down the front of my right leg in the muscle. The pain in the hip is a dull pain at a 4-5 when I'm sitting. When I stand, the pain going down my leg feels like the muscle is tightening up and that pain is upward to an 8 at times. I have been putting ice on my hip and taking Ibuprofen for the pain. I've also used a muscle cream." I asked about other symptoms, he says "the pain down my leg and mild middle back pain." According to protocol, see PCP within 3 day, appointment already scheduled for Thursday, 10/26/18 at 0930 with Dr. Jeananne Rama, care advice given, patient verbalized understanding.  Reason for Disposition . [1] MODERATE pain (e.g., interferes with normal activities, limping) AND [2] present > 3 days  Answer Assessment - Initial Assessment Questions 1. LOCATION and RADIATION: "Where is the pain located?"      Right hip 2. QUALITY: "What does the pain feel like?"  (e.g., sharp, dull, aching, burning)     Some pain in the hip dull  3. SEVERITY: "How bad is the pain?" "What does it keep you from doing?"   (Scale 1-10; or mild, moderate, severe)   -  MILD (1-3): doesn't interfere with normal activities    -  MODERATE (4-7): interferes with normal activities (e.g., work or school) or awakens from sleep, limping    -  SEVERE (8-10): excruciating pain, unable to do any normal activities, unable to walk     Hip pain is a 4-5; pain down leg when stand up to 8 4. ONSET: "When did the pain start?" "Does it come and go, or is it there all the time?"    Over the weekend; got worse yesterday; standing constant pain 5. WORK OR EXERCISE: "Has there been any recent work or exercise that involved this part of the body?"      Not that I remember 6. CAUSE: "What do you think is causing the hip pain?"      I don't know 7. AGGRAVATING FACTORS:  "What makes the hip pain worse?" (e.g., walking, climbing stairs, running)     Worse when sitting up and/or stand. Straighten the leg out makes the hip feel better 8. OTHER SYMPTOMS: "Do you have any other symptoms?" (e.g., back pain, pain shooting down leg,  fever, rash)    Pain shooting down front of right leg  Protocols used: HIP PAIN-A-AH

## 2018-10-25 DIAGNOSIS — M5431 Sciatica, right side: Secondary | ICD-10-CM | POA: Diagnosis not present

## 2018-10-25 DIAGNOSIS — M9903 Segmental and somatic dysfunction of lumbar region: Secondary | ICD-10-CM | POA: Diagnosis not present

## 2018-10-25 DIAGNOSIS — M5416 Radiculopathy, lumbar region: Secondary | ICD-10-CM | POA: Diagnosis not present

## 2018-10-25 DIAGNOSIS — M9905 Segmental and somatic dysfunction of pelvic region: Secondary | ICD-10-CM | POA: Diagnosis not present

## 2018-10-26 ENCOUNTER — Encounter: Payer: Self-pay | Admitting: Family Medicine

## 2018-10-26 ENCOUNTER — Ambulatory Visit (INDEPENDENT_AMBULATORY_CARE_PROVIDER_SITE_OTHER): Payer: BLUE CROSS/BLUE SHIELD | Admitting: Family Medicine

## 2018-10-26 VITALS — BP 155/88 | HR 64 | Temp 97.9°F

## 2018-10-26 DIAGNOSIS — I10 Essential (primary) hypertension: Secondary | ICD-10-CM | POA: Diagnosis not present

## 2018-10-26 DIAGNOSIS — M5416 Radiculopathy, lumbar region: Secondary | ICD-10-CM | POA: Diagnosis not present

## 2018-10-26 DIAGNOSIS — M9905 Segmental and somatic dysfunction of pelvic region: Secondary | ICD-10-CM | POA: Diagnosis not present

## 2018-10-26 DIAGNOSIS — E785 Hyperlipidemia, unspecified: Secondary | ICD-10-CM | POA: Diagnosis not present

## 2018-10-26 DIAGNOSIS — M5431 Sciatica, right side: Secondary | ICD-10-CM

## 2018-10-26 DIAGNOSIS — M9903 Segmental and somatic dysfunction of lumbar region: Secondary | ICD-10-CM | POA: Diagnosis not present

## 2018-10-26 LAB — LP+ALT+AST PICCOLO, WAIVED
ALT (SGPT) Piccolo, Waived: 32 U/L (ref 10–47)
AST (SGOT) PICCOLO, WAIVED: 29 U/L (ref 11–38)
CHOL/HDL RATIO PICCOLO,WAIVE: 2.5 mg/dL
CHOLESTEROL PICCOLO, WAIVED: 152 mg/dL (ref <200)
HDL CHOL PICCOLO, WAIVED: 60 mg/dL (ref >59)
LDL Chol Calc Piccolo Waived: 64 mg/dL (ref <100)
TRIGLYCERIDES PICCOLO,WAIVED: 138 mg/dL (ref <150)
VLDL Chol Calc Piccolo,Waive: 28 mg/dL (ref <30)

## 2018-10-26 MED ORDER — PREDNISONE 10 MG PO TABS: ORAL_TABLET | ORAL | 0 refills | Status: DC

## 2018-10-26 MED ORDER — TRAMADOL HCL 50 MG PO TABS: 50.0000 mg | ORAL_TABLET | Freq: Three times a day (TID) | ORAL | 0 refills | Status: DC | PRN

## 2018-10-26 NOTE — Assessment & Plan Note (Signed)
Discussed extension exercises prednisone tramadol.  Afterwards prednisone may use Advil Aleve etc. discussed proper lifting techniques

## 2018-10-26 NOTE — Progress Notes (Signed)
BP (!) 155/88 (BP Location: Right Arm)   Pulse 64   Temp 97.9 F (36.6 C)   SpO2 97%    Subjective:    Patient ID: Craig Ryan, male    DOB: June 20, 1961, 57 y.o.   MRN: [id number]  HPI: Craig Ryan is a 57 y.o. male  Chief Complaint  Patient presents with  . Hypertension  . Hyperlipidemia  . Sciatica  Patient follow-up high blood pressure cholesterol is been doing well but up today as patient is having marked sciatica symptoms with right back radiating into his right hip and thigh down to his knee.  This been ongoing for 3 days and is developed after putting on a new floor in his kitchen.  Patient not with any 1 point of pain but just woke up with this discomfort.  Has been to a Restaurant manager, fast food. Patient having marked pain with elevated blood pressure readings.  Patient's blood pressures been doing well. Cholesterol doing well with no complaints  Relevant past medical, surgical, family and social history reviewed and updated as indicated. Interim medical history since our last visit reviewed. Allergies and medications reviewed and updated.  Review of Systems  Constitutional: Negative.   Respiratory: Negative.   Cardiovascular: Negative.     Per HPI unless specifically indicated above     Objective:    BP (!) 155/88 (BP Location: Right Arm)   Pulse 64   Temp 97.9 F (36.6 C)   SpO2 97%   Wt Readings from Last 3 Encounters:  04/26/18 210 lb (95.3 kg)  02/03/18 210 lb 12.8 oz (95.6 kg)  10/11/17 208 lb (94.3 kg)    Physical Exam Constitutional:      Appearance: He is well-developed.  HENT:     Head: Normocephalic and atraumatic.  Eyes:     Conjunctiva/sclera: Conjunctivae normal.  Neck:     Musculoskeletal: Normal range of motion.  Cardiovascular:     Rate and Rhythm: Normal rate and regular rhythm.     Heart sounds: Normal heart sounds.  Pulmonary:     Effort: Pulmonary effort is normal.     Breath sounds: Normal breath sounds.  Musculoskeletal: Normal  range of motion.     Comments: Patient with marked radicular symptoms  Skin:    Findings: No erythema.  Neurological:     Mental Status: He is alert and oriented to person, place, and time.  Psychiatric:        Behavior: Behavior normal.        Thought Content: Thought content normal.        Judgment: Judgment normal.     Results for orders placed or performed in visit on 04/26/18  Microscopic Examination  Result Value Ref Range   WBC, UA 0-5 0 - 5 /hpf   RBC, UA 0-2 0 - 2 /hpf   Epithelial Cells (non renal) 0-10 0 - 10 /hpf   Bacteria, UA None seen None seen/Few  CBC with Differential/Platelet  Result Value Ref Range   WBC 8.0 3.4 - 10.8 x10E3/uL   RBC 5.24 4.14 - 5.80 x10E6/uL   Hemoglobin 15.9 13.0 - 17.7 g/dL   Hematocrit 46.6 37.5 - 51.0 %   MCV 89 79 - 97 fL   MCH 30.3 26.6 - 33.0 pg   MCHC 34.1 31.5 - 35.7 g/dL   RDW 13.8 12.3 - 15.4 %   Platelets 246 150 - 450 x10E3/uL   Neutrophils 65 Not Estab. %   Lymphs 23 Not Estab. %  Monocytes 9 Not Estab. %   Eos 2 Not Estab. %   Basos 1 Not Estab. %   Neutrophils Absolute 5.3 1.4 - 7.0 x10E3/uL   Lymphocytes Absolute 1.8 0.7 - 3.1 x10E3/uL   Monocytes Absolute 0.7 0.1 - 0.9 x10E3/uL   EOS (ABSOLUTE) 0.2 0.0 - 0.4 x10E3/uL   Basophils Absolute 0.1 0.0 - 0.2 x10E3/uL   Immature Granulocytes 0 Not Estab. %   Immature Grans (Abs) 0.0 0.0 - 0.1 x10E3/uL  Comprehensive metabolic panel  Result Value Ref Range   Glucose 91 65 - 99 mg/dL   BUN 14 6 - 24 mg/dL   Creatinine, Ser 1.06 0.76 - 1.27 mg/dL   GFR calc non Af Amer 78 >59 mL/min/1.73   GFR calc Af Amer 90 >59 mL/min/1.73   BUN/Creatinine Ratio 13 9 - 20   Sodium 144 134 - 144 mmol/L   Potassium 3.9 3.5 - 5.2 mmol/L   Chloride 105 96 - 106 mmol/L   CO2 23 20 - 29 mmol/L   Calcium 9.1 8.7 - 10.2 mg/dL   Total Protein 7.3 6.0 - 8.5 g/dL   Albumin 4.2 3.5 - 5.5 g/dL   Globulin, Total 3.1 1.5 - 4.5 g/dL   Albumin/Globulin Ratio 1.4 1.2 - 2.2   Bilirubin Total 0.4  0.0 - 1.2 mg/dL   Alkaline Phosphatase 96 39 - 117 IU/L   AST 18 0 - 40 IU/L   ALT 27 0 - 44 IU/L  Lipid panel  Result Value Ref Range   Cholesterol, Total 148 100 - 199 mg/dL   Triglycerides 90 0 - 149 mg/dL   HDL 54 >39 mg/dL   VLDL Cholesterol Cal 18 5 - 40 mg/dL   LDL Calculated 76 0 - 99 mg/dL   Chol/HDL Ratio 2.7 0.0 - 5.0 ratio  PSA  Result Value Ref Range   Prostate Specific Ag, Serum 1.8 0.0 - 4.0 ng/mL  TSH  Result Value Ref Range   TSH 0.921 0.450 - 4.500 uIU/mL  Urinalysis, Routine w reflex microscopic  Result Value Ref Range   Specific Gravity, UA 1.020 1.005 - 1.030   pH, UA 6.0 5.0 - 7.5   Color, UA Yellow Yellow   Appearance Ur Clear Clear   Leukocytes, UA Negative Negative   Protein, UA Negative Negative/Trace   Glucose, UA Negative Negative   Ketones, UA Negative Negative   RBC, UA Trace (A) Negative   Bilirubin, UA Negative Negative   Urobilinogen, Ur 0.2 0.2 - 1.0 mg/dL   Nitrite, UA Negative Negative   Microscopic Examination See below:   HIV antibody  Result Value Ref Range   HIV Screen 4th Generation wRfx Non Reactive Non Reactive      Assessment & Plan:   Problem List Items Addressed This Visit      Cardiovascular and Mediastinum   Hypertension    Poor control of hypertension discuss mostly secondary to pain will not change medications today but will observe if blood pressure stays up will need to increase medications.      Relevant Orders   Basic metabolic panel     Nervous and Auditory   Sciatica, right side    Discussed extension exercises prednisone tramadol.  Afterwards prednisone may use Advil Aleve etc. discussed proper lifting techniques        Other   Hyperlipidemia - Primary    The current medical regimen is effective;  continue present plan and medications.       Relevant Orders  LP+ALT+AST Piccolo, Waived       Follow up plan: Return in about 6 months (around 04/27/2019) for Physical Exam.

## 2018-10-26 NOTE — Assessment & Plan Note (Signed)
The current medical regimen is effective;  continue present plan and medications.  

## 2018-10-26 NOTE — Assessment & Plan Note (Signed)
Poor control of hypertension discuss mostly secondary to pain will not change medications today but will observe if blood pressure stays up will need to increase medications.

## 2018-10-27 ENCOUNTER — Encounter: Payer: Self-pay | Admitting: Family Medicine

## 2018-10-27 DIAGNOSIS — M5431 Sciatica, right side: Secondary | ICD-10-CM | POA: Diagnosis not present

## 2018-10-27 DIAGNOSIS — M5416 Radiculopathy, lumbar region: Secondary | ICD-10-CM | POA: Diagnosis not present

## 2018-10-27 DIAGNOSIS — M9903 Segmental and somatic dysfunction of lumbar region: Secondary | ICD-10-CM | POA: Diagnosis not present

## 2018-10-27 DIAGNOSIS — M9905 Segmental and somatic dysfunction of pelvic region: Secondary | ICD-10-CM | POA: Diagnosis not present

## 2018-10-27 LAB — BASIC METABOLIC PANEL
BUN / CREAT RATIO: 19 (ref 9–20)
BUN: 20 mg/dL (ref 6–24)
CO2: 22 mmol/L (ref 20–29)
Calcium: 9.2 mg/dL (ref 8.7–10.2)
Chloride: 98 mmol/L (ref 96–106)
Creatinine, Ser: 1.07 mg/dL (ref 0.76–1.27)
GFR, EST AFRICAN AMERICAN: 89 mL/min/{1.73_m2} (ref >59)
GFR, EST NON AFRICAN AMERICAN: 77 mL/min/{1.73_m2} (ref >59)
Glucose: 120 mg/dL — ABNORMAL HIGH (ref 65–99)
POTASSIUM: 3.5 mmol/L (ref 3.5–5.2)
Sodium: 140 mmol/L (ref 134–144)

## 2018-10-30 DIAGNOSIS — M9903 Segmental and somatic dysfunction of lumbar region: Secondary | ICD-10-CM | POA: Diagnosis not present

## 2018-10-30 DIAGNOSIS — M9905 Segmental and somatic dysfunction of pelvic region: Secondary | ICD-10-CM | POA: Diagnosis not present

## 2018-10-30 DIAGNOSIS — M5416 Radiculopathy, lumbar region: Secondary | ICD-10-CM | POA: Diagnosis not present

## 2018-10-30 DIAGNOSIS — M5431 Sciatica, right side: Secondary | ICD-10-CM | POA: Diagnosis not present

## 2018-10-31 DIAGNOSIS — M9905 Segmental and somatic dysfunction of pelvic region: Secondary | ICD-10-CM | POA: Diagnosis not present

## 2018-10-31 DIAGNOSIS — M9903 Segmental and somatic dysfunction of lumbar region: Secondary | ICD-10-CM | POA: Diagnosis not present

## 2018-10-31 DIAGNOSIS — M5416 Radiculopathy, lumbar region: Secondary | ICD-10-CM | POA: Diagnosis not present

## 2018-10-31 DIAGNOSIS — M5431 Sciatica, right side: Secondary | ICD-10-CM | POA: Diagnosis not present

## 2018-11-01 ENCOUNTER — Encounter: Payer: Self-pay | Admitting: Family Medicine

## 2018-11-03 DIAGNOSIS — M9905 Segmental and somatic dysfunction of pelvic region: Secondary | ICD-10-CM | POA: Diagnosis not present

## 2018-11-03 DIAGNOSIS — M5416 Radiculopathy, lumbar region: Secondary | ICD-10-CM | POA: Diagnosis not present

## 2018-11-03 DIAGNOSIS — M5431 Sciatica, right side: Secondary | ICD-10-CM | POA: Diagnosis not present

## 2018-11-03 DIAGNOSIS — M9903 Segmental and somatic dysfunction of lumbar region: Secondary | ICD-10-CM | POA: Diagnosis not present

## 2018-11-03 MED ORDER — CYCLOBENZAPRINE HCL 10 MG PO TABS: 10.0000 mg | ORAL_TABLET | Freq: Three times a day (TID) | ORAL | 0 refills | Status: DC | PRN

## 2018-11-06 DIAGNOSIS — M9905 Segmental and somatic dysfunction of pelvic region: Secondary | ICD-10-CM | POA: Diagnosis not present

## 2018-11-06 DIAGNOSIS — M9903 Segmental and somatic dysfunction of lumbar region: Secondary | ICD-10-CM | POA: Diagnosis not present

## 2018-11-06 DIAGNOSIS — M5431 Sciatica, right side: Secondary | ICD-10-CM | POA: Diagnosis not present

## 2018-11-06 DIAGNOSIS — M5416 Radiculopathy, lumbar region: Secondary | ICD-10-CM | POA: Diagnosis not present

## 2018-11-07 DIAGNOSIS — M5431 Sciatica, right side: Secondary | ICD-10-CM | POA: Diagnosis not present

## 2018-11-07 DIAGNOSIS — M5416 Radiculopathy, lumbar region: Secondary | ICD-10-CM | POA: Diagnosis not present

## 2018-11-07 DIAGNOSIS — M9903 Segmental and somatic dysfunction of lumbar region: Secondary | ICD-10-CM | POA: Diagnosis not present

## 2018-11-07 DIAGNOSIS — M9905 Segmental and somatic dysfunction of pelvic region: Secondary | ICD-10-CM | POA: Diagnosis not present

## 2018-11-15 DIAGNOSIS — M5431 Sciatica, right side: Secondary | ICD-10-CM | POA: Diagnosis not present

## 2018-11-15 DIAGNOSIS — M5416 Radiculopathy, lumbar region: Secondary | ICD-10-CM | POA: Diagnosis not present

## 2018-11-15 DIAGNOSIS — M9905 Segmental and somatic dysfunction of pelvic region: Secondary | ICD-10-CM | POA: Diagnosis not present

## 2018-11-15 DIAGNOSIS — M9903 Segmental and somatic dysfunction of lumbar region: Secondary | ICD-10-CM | POA: Diagnosis not present

## 2018-11-17 DIAGNOSIS — M5431 Sciatica, right side: Secondary | ICD-10-CM | POA: Diagnosis not present

## 2018-11-17 DIAGNOSIS — M9905 Segmental and somatic dysfunction of pelvic region: Secondary | ICD-10-CM | POA: Diagnosis not present

## 2018-11-17 DIAGNOSIS — M9903 Segmental and somatic dysfunction of lumbar region: Secondary | ICD-10-CM | POA: Diagnosis not present

## 2018-11-17 DIAGNOSIS — M5416 Radiculopathy, lumbar region: Secondary | ICD-10-CM | POA: Diagnosis not present

## 2018-11-22 DIAGNOSIS — M9903 Segmental and somatic dysfunction of lumbar region: Secondary | ICD-10-CM | POA: Diagnosis not present

## 2018-11-22 DIAGNOSIS — M5431 Sciatica, right side: Secondary | ICD-10-CM | POA: Diagnosis not present

## 2018-11-22 DIAGNOSIS — M9905 Segmental and somatic dysfunction of pelvic region: Secondary | ICD-10-CM | POA: Diagnosis not present

## 2018-11-22 DIAGNOSIS — M5416 Radiculopathy, lumbar region: Secondary | ICD-10-CM | POA: Diagnosis not present

## 2018-11-23 DIAGNOSIS — M4716 Other spondylosis with myelopathy, lumbar region: Secondary | ICD-10-CM | POA: Diagnosis not present

## 2018-11-24 DIAGNOSIS — M9905 Segmental and somatic dysfunction of pelvic region: Secondary | ICD-10-CM | POA: Diagnosis not present

## 2018-11-24 DIAGNOSIS — M5416 Radiculopathy, lumbar region: Secondary | ICD-10-CM | POA: Diagnosis not present

## 2018-11-24 DIAGNOSIS — M5431 Sciatica, right side: Secondary | ICD-10-CM | POA: Diagnosis not present

## 2018-11-24 DIAGNOSIS — M9903 Segmental and somatic dysfunction of lumbar region: Secondary | ICD-10-CM | POA: Diagnosis not present

## 2018-11-27 DIAGNOSIS — M5416 Radiculopathy, lumbar region: Secondary | ICD-10-CM | POA: Diagnosis not present

## 2018-11-27 DIAGNOSIS — M9903 Segmental and somatic dysfunction of lumbar region: Secondary | ICD-10-CM | POA: Diagnosis not present

## 2018-11-27 DIAGNOSIS — M5431 Sciatica, right side: Secondary | ICD-10-CM | POA: Diagnosis not present

## 2018-11-27 DIAGNOSIS — M9905 Segmental and somatic dysfunction of pelvic region: Secondary | ICD-10-CM | POA: Diagnosis not present

## 2018-11-30 DIAGNOSIS — M9905 Segmental and somatic dysfunction of pelvic region: Secondary | ICD-10-CM | POA: Diagnosis not present

## 2018-11-30 DIAGNOSIS — M5431 Sciatica, right side: Secondary | ICD-10-CM | POA: Diagnosis not present

## 2018-11-30 DIAGNOSIS — M5416 Radiculopathy, lumbar region: Secondary | ICD-10-CM | POA: Diagnosis not present

## 2018-11-30 DIAGNOSIS — M9903 Segmental and somatic dysfunction of lumbar region: Secondary | ICD-10-CM | POA: Diagnosis not present

## 2018-12-04 DIAGNOSIS — M9903 Segmental and somatic dysfunction of lumbar region: Secondary | ICD-10-CM | POA: Diagnosis not present

## 2018-12-04 DIAGNOSIS — M9905 Segmental and somatic dysfunction of pelvic region: Secondary | ICD-10-CM | POA: Diagnosis not present

## 2018-12-04 DIAGNOSIS — M5416 Radiculopathy, lumbar region: Secondary | ICD-10-CM | POA: Diagnosis not present

## 2018-12-04 DIAGNOSIS — M5431 Sciatica, right side: Secondary | ICD-10-CM | POA: Diagnosis not present

## 2018-12-06 DIAGNOSIS — M545 Low back pain: Secondary | ICD-10-CM | POA: Diagnosis not present

## 2018-12-06 DIAGNOSIS — M9903 Segmental and somatic dysfunction of lumbar region: Secondary | ICD-10-CM | POA: Diagnosis not present

## 2018-12-06 DIAGNOSIS — M9905 Segmental and somatic dysfunction of pelvic region: Secondary | ICD-10-CM | POA: Diagnosis not present

## 2018-12-06 DIAGNOSIS — M5431 Sciatica, right side: Secondary | ICD-10-CM | POA: Diagnosis not present

## 2018-12-06 DIAGNOSIS — M6281 Muscle weakness (generalized): Secondary | ICD-10-CM | POA: Diagnosis not present

## 2018-12-06 DIAGNOSIS — M5416 Radiculopathy, lumbar region: Secondary | ICD-10-CM | POA: Diagnosis not present

## 2018-12-14 DIAGNOSIS — M5416 Radiculopathy, lumbar region: Secondary | ICD-10-CM | POA: Diagnosis not present

## 2018-12-14 DIAGNOSIS — M9905 Segmental and somatic dysfunction of pelvic region: Secondary | ICD-10-CM | POA: Diagnosis not present

## 2018-12-14 DIAGNOSIS — M5431 Sciatica, right side: Secondary | ICD-10-CM | POA: Diagnosis not present

## 2018-12-14 DIAGNOSIS — M6281 Muscle weakness (generalized): Secondary | ICD-10-CM | POA: Diagnosis not present

## 2018-12-14 DIAGNOSIS — M545 Low back pain: Secondary | ICD-10-CM | POA: Diagnosis not present

## 2018-12-14 DIAGNOSIS — M9903 Segmental and somatic dysfunction of lumbar region: Secondary | ICD-10-CM | POA: Diagnosis not present

## 2018-12-20 DIAGNOSIS — M9905 Segmental and somatic dysfunction of pelvic region: Secondary | ICD-10-CM | POA: Diagnosis not present

## 2018-12-20 DIAGNOSIS — M9903 Segmental and somatic dysfunction of lumbar region: Secondary | ICD-10-CM | POA: Diagnosis not present

## 2018-12-20 DIAGNOSIS — M5431 Sciatica, right side: Secondary | ICD-10-CM | POA: Diagnosis not present

## 2018-12-20 DIAGNOSIS — M545 Low back pain: Secondary | ICD-10-CM | POA: Diagnosis not present

## 2018-12-20 DIAGNOSIS — M5416 Radiculopathy, lumbar region: Secondary | ICD-10-CM | POA: Diagnosis not present

## 2018-12-20 DIAGNOSIS — M6281 Muscle weakness (generalized): Secondary | ICD-10-CM | POA: Diagnosis not present

## 2018-12-27 DIAGNOSIS — M6281 Muscle weakness (generalized): Secondary | ICD-10-CM | POA: Diagnosis not present

## 2018-12-27 DIAGNOSIS — M545 Low back pain: Secondary | ICD-10-CM | POA: Diagnosis not present

## 2019-01-03 DIAGNOSIS — M9903 Segmental and somatic dysfunction of lumbar region: Secondary | ICD-10-CM | POA: Diagnosis not present

## 2019-01-03 DIAGNOSIS — M5416 Radiculopathy, lumbar region: Secondary | ICD-10-CM | POA: Diagnosis not present

## 2019-01-03 DIAGNOSIS — M5431 Sciatica, right side: Secondary | ICD-10-CM | POA: Diagnosis not present

## 2019-01-03 DIAGNOSIS — M9905 Segmental and somatic dysfunction of pelvic region: Secondary | ICD-10-CM | POA: Diagnosis not present

## 2019-02-28 DIAGNOSIS — M9903 Segmental and somatic dysfunction of lumbar region: Secondary | ICD-10-CM | POA: Diagnosis not present

## 2019-02-28 DIAGNOSIS — M9905 Segmental and somatic dysfunction of pelvic region: Secondary | ICD-10-CM | POA: Diagnosis not present

## 2019-02-28 DIAGNOSIS — M5416 Radiculopathy, lumbar region: Secondary | ICD-10-CM | POA: Diagnosis not present

## 2019-02-28 DIAGNOSIS — M5431 Sciatica, right side: Secondary | ICD-10-CM | POA: Diagnosis not present

## 2019-03-28 DIAGNOSIS — M9905 Segmental and somatic dysfunction of pelvic region: Secondary | ICD-10-CM | POA: Diagnosis not present

## 2019-03-28 DIAGNOSIS — M9903 Segmental and somatic dysfunction of lumbar region: Secondary | ICD-10-CM | POA: Diagnosis not present

## 2019-03-28 DIAGNOSIS — M5431 Sciatica, right side: Secondary | ICD-10-CM | POA: Diagnosis not present

## 2019-03-28 DIAGNOSIS — M5416 Radiculopathy, lumbar region: Secondary | ICD-10-CM | POA: Diagnosis not present

## 2019-04-25 DIAGNOSIS — M9903 Segmental and somatic dysfunction of lumbar region: Secondary | ICD-10-CM | POA: Diagnosis not present

## 2019-04-25 DIAGNOSIS — M5416 Radiculopathy, lumbar region: Secondary | ICD-10-CM | POA: Diagnosis not present

## 2019-04-25 DIAGNOSIS — M5431 Sciatica, right side: Secondary | ICD-10-CM | POA: Diagnosis not present

## 2019-04-25 DIAGNOSIS — M9905 Segmental and somatic dysfunction of pelvic region: Secondary | ICD-10-CM | POA: Diagnosis not present

## 2019-05-08 ENCOUNTER — Other Ambulatory Visit: Payer: Self-pay

## 2019-05-08 ENCOUNTER — Ambulatory Visit (INDEPENDENT_AMBULATORY_CARE_PROVIDER_SITE_OTHER): Payer: BC Managed Care – PPO | Admitting: Family Medicine

## 2019-05-08 ENCOUNTER — Encounter: Payer: Self-pay | Admitting: Family Medicine

## 2019-05-08 DIAGNOSIS — Z0000 Encounter for general adult medical examination without abnormal findings: Secondary | ICD-10-CM

## 2019-05-08 DIAGNOSIS — G4733 Obstructive sleep apnea (adult) (pediatric): Secondary | ICD-10-CM

## 2019-05-08 DIAGNOSIS — E785 Hyperlipidemia, unspecified: Secondary | ICD-10-CM | POA: Diagnosis not present

## 2019-05-08 DIAGNOSIS — R7303 Prediabetes: Secondary | ICD-10-CM

## 2019-05-08 DIAGNOSIS — I10 Essential (primary) hypertension: Secondary | ICD-10-CM | POA: Diagnosis not present

## 2019-05-08 DIAGNOSIS — Z9989 Dependence on other enabling machines and devices: Secondary | ICD-10-CM

## 2019-05-08 MED ORDER — METOPROLOL SUCCINATE ER 50 MG PO TB24: 50.0000 mg | ORAL_TABLET | Freq: Every day | ORAL | 4 refills | Status: DC

## 2019-05-08 MED ORDER — AMLODIPINE BESYLATE 5 MG PO TABS: 5.0000 mg | ORAL_TABLET | Freq: Every day | ORAL | 4 refills | Status: DC

## 2019-05-08 MED ORDER — ATORVASTATIN CALCIUM 40 MG PO TABS: 40.0000 mg | ORAL_TABLET | Freq: Every day | ORAL | 4 refills | Status: DC

## 2019-05-08 MED ORDER — LISINOPRIL 40 MG PO TABS: 40.0000 mg | ORAL_TABLET | Freq: Every day | ORAL | 4 refills | Status: DC

## 2019-05-08 NOTE — Assessment & Plan Note (Signed)
Uses CPAP and no daytime sleepness

## 2019-05-08 NOTE — Assessment & Plan Note (Signed)
The current medical regimen is effective;  continue present plan and medications.  

## 2019-05-08 NOTE — Progress Notes (Signed)
There were no vitals taken for this visit.   Subjective:    Patient ID: Craig Ryan, male    DOB: Aug 09, 1961, 58 y.o.   MRN: [id number]  HPI: Craig Ryan is a 58 y.o. male  Med check Discussed with patient doing well with blood pressure medication no complaints taken faithfully without problems. Same for cholesterol with no issues with Lipitor. Back sciatica symptoms has resolved. Sleep apnea stable on CPAP machine without problems.  No daytime drowsiness.  Relevant past medical, surgical, family and social history reviewed and updated as indicated. Interim medical history since our last visit reviewed. Allergies and medications reviewed and updated.  Review of Systems  Constitutional: Negative.   Respiratory: Negative.   Cardiovascular: Negative.     Per HPI unless specifically indicated above     Objective:    There were no vitals taken for this visit.  Wt Readings from Last 3 Encounters:  04/26/18 210 lb (95.3 kg)  02/03/18 210 lb 12.8 oz (95.6 kg)  10/11/17 208 lb (94.3 kg)    Physical Exam  Results for orders placed or performed in visit on 10/26/18  LP+ALT+AST Piccolo, [employer]  Result Value Ref Range   ALT (SGPT) Piccolo, Waived 32 10 - 47 U/L   AST (SGOT) Piccolo, Waived 29 11 - 38 U/L   Cholesterol Piccolo, Waived 152 <200 mg/dL   HDL Chol Piccolo, Waived 60 >59 mg/dL   Triglycerides Piccolo,Waived 138 <150 mg/dL   Chol/HDL Ratio Piccolo,Waive 2.5 mg/dL   LDL Chol Calc Piccolo Waived 64 <100 mg/dL   VLDL Chol Calc Piccolo,Waive 28 <30 mg/dL  Basic metabolic panel  Result Value Ref Range   Glucose 120 (H) 65 - 99 mg/dL   BUN 20 6 - 24 mg/dL   Creatinine, Ser 1.07 0.76 - 1.27 mg/dL   GFR calc non Af Amer 77 >59 mL/min/1.73   GFR calc Af Amer 89 >59 mL/min/1.73   BUN/Creatinine Ratio 19 9 - 20   Sodium 140 134 - 144 mmol/L   Potassium 3.5 3.5 - 5.2 mmol/L   Chloride 98 96 - 106 mmol/L   CO2 22 20 - 29 mmol/L   Calcium 9.2 8.7 - 10.2 mg/dL       Assessment & Plan:   Problem List Items Addressed This Visit      Cardiovascular and Mediastinum   Hypertension    The current medical regimen is effective;  continue present plan and medications.       Relevant Medications   metoprolol succinate (TOPROL-XL) 50 MG 24 hr tablet   amLODipine (NORVASC) 5 MG tablet   atorvastatin (LIPITOR) 40 MG tablet   lisinopril (ZESTRIL) 40 MG tablet     Respiratory   OSA on CPAP    Uses CPAP and no daytime sleepness        Other   Hyperlipidemia    The current medical regimen is effective;  continue present plan and medications.       Relevant Medications   metoprolol succinate (TOPROL-XL) 50 MG 24 hr tablet   amLODipine (NORVASC) 5 MG tablet   atorvastatin (LIPITOR) 40 MG tablet   lisinopril (ZESTRIL) 40 MG tablet   Prediabetes    Will check a1c      Relevant Orders   Bayer DCA Hb A1c Waived    Other Visit Diagnoses    PE (physical exam), annual    -  Primary   Relevant Orders   Comprehensive metabolic panel   Lipid  panel   CBC with Differential/Platelet   TSH   Urinalysis, Routine w reflex microscopic   PSA      Telemedicine using audio/video telecommunications for a synchronous communication visit. Today's visit due to COVID-19 isolation precautions I connected with and verified that I am speaking with the correct person using two identifiers.   I discussed the limitations, risks, security and privacy concerns of performing an evaluation and management service by telecommunication and the availability of in person appointments. I also discussed with the patient that there may be a patient responsible charge related to this service. The patient expressed understanding and agreed to proceed. The patient's location is I am at home.   I discussed the assessment and treatment plan with the patient. The patient was provided an opportunity to ask questions and all were answered. The patient agreed with the plan and  demonstrated an understanding of the instructions.   The patient was advised to call back or seek an in-person evaluation if the symptoms worsen or if the condition fails to improve as anticipated.   I provided 21+ minutes of time during this encounter.  Follow up plan: Return in about 6 months (around 11/07/2019) for BMP,  Lipids, ALT, AST.

## 2019-05-08 NOTE — Assessment & Plan Note (Signed)
Will check a1c

## 2019-05-28 ENCOUNTER — Encounter: Payer: Self-pay | Admitting: Nurse Practitioner

## 2019-05-28 ENCOUNTER — Ambulatory Visit (INDEPENDENT_AMBULATORY_CARE_PROVIDER_SITE_OTHER): Payer: BC Managed Care – PPO | Admitting: Nurse Practitioner

## 2019-05-28 ENCOUNTER — Other Ambulatory Visit: Payer: Self-pay

## 2019-05-28 VITALS — BP 138/82 | HR 72 | Temp 98.6°F | Ht 67.2 in | Wt 209.0 lb

## 2019-05-28 DIAGNOSIS — R7303 Prediabetes: Secondary | ICD-10-CM

## 2019-05-28 DIAGNOSIS — I10 Essential (primary) hypertension: Secondary | ICD-10-CM | POA: Diagnosis not present

## 2019-05-28 DIAGNOSIS — Z0000 Encounter for general adult medical examination without abnormal findings: Secondary | ICD-10-CM | POA: Diagnosis not present

## 2019-05-28 DIAGNOSIS — E785 Hyperlipidemia, unspecified: Secondary | ICD-10-CM | POA: Diagnosis not present

## 2019-05-28 DIAGNOSIS — R222 Localized swelling, mass and lump, trunk: Secondary | ICD-10-CM | POA: Insufficient documentation

## 2019-05-28 NOTE — Patient Instructions (Signed)

## 2019-05-28 NOTE — Assessment & Plan Note (Signed)
Chronic, stable.  Continue current medication regimen and adjust as needed.  Lipid panel today. 

## 2019-05-28 NOTE — Assessment & Plan Note (Signed)
Check A1C today

## 2019-05-28 NOTE — Assessment & Plan Note (Addendum)
Chronic, stable with initial BP above goal and repeat at goal, suspect some white coat syndrome. Continue current medication regimen.  Labs today.

## 2019-05-28 NOTE — Progress Notes (Signed)
BP 138/82 (BP Location: Left Arm, Patient Position: Sitting)   Pulse 72   Temp 98.6 F (37 C) (Oral)   Ht 5' 7.2" (1.707 m)   Wt 209 lb (94.8 kg)   SpO2 98%   BMI 32.54 kg/m    Subjective:    Patient ID: Craig Ryan, male    DOB: 1961/09/02, 58 y.o.   MRN: [id number]  HPI: Craig Ryan is a 58 y.o. male presenting on 05/28/2019 for comprehensive medical examination. Current medical complaints include:none  He currently lives with: wife Interim Problems from his last visit: no   HYPERTENSION / [place] Satisfied with current treatment? yes Duration of hypertension: chronic BP monitoring frequency: not checking BP range: none BP medication side effects: no Duration of hyperlipidemia: chronic Cholesterol medication side effects: no Cholesterol supplements: none Medication compliance: good compliance Aspirin: yes Recent stressors: yes, recent job loss Recurrent headaches: no Visual changes: no Palpitations: no Dyspnea: no Chest pain: no Lower extremity edema: no Dizzy/lightheaded: no   PREDIABETES: On review A1C have been 5.4 to 5.7 since 2017.  He denies polyuria, polyphagia, polydipsia.     Functional Status Survey: Is the patient deaf or have difficulty hearing?: No Does the patient have difficulty seeing, even when wearing glasses/contacts?: No Does the patient have difficulty concentrating, remembering, or making decisions?: No Does the patient have difficulty walking or climbing stairs?: No Does the patient have difficulty dressing or bathing?: No Does the patient have difficulty doing errands alone such as visiting a doctor's office or shopping?: No  FALL RISK: Fall Risk  05/28/2019 04/26/2018 10/11/2017 04/06/2017  Falls in the past year? 0 No No No  Number falls in past yr: 0 - - -  Injury with Fall? 0 - - -  Follow up Falls evaluation completed - - -    Depression Screen Depression screen [facility] 2/9 05/28/2019 04/26/2018 10/11/2017 04/06/2017  04/01/2016  Decreased Interest 0 0 0 0 0  Down, Depressed, Hopeless 0 0 0 0 0  PHQ - 2 Score 0 0 0 0 0  Altered sleeping 1 - - - 0  Tired, decreased energy 0 - - - 0  Change in appetite 1 - - - 0  Feeling bad or failure about yourself  0 - - - 0  Trouble concentrating 1 - - - 0  Moving slowly or fidgety/restless 0 - - - 0  Suicidal thoughts 0 - - - 0  PHQ-9 Score 3 - - - 0  Difficult doing work/chores Not difficult at all - - - -    Advanced Directives <no information>  Past Medical History:  Past Medical History:  Diagnosis Date  . Allergy   . History of shingles   . Hyperlipidemia   . Hypertension   . Nephrolithiasis   . OSA on CPAP     Surgical History:  Past Surgical History:  Procedure Laterality Date  . COLONOSCOPY    . EXTRACORPOREAL SHOCK WAVE LITHOTRIPSY Left 10/16/2015   Procedure: EXTRACORPOREAL SHOCK WAVE LITHOTRIPSY (ESWL);  Surgeon: Royston Cowper, MD;  Location: [facility] ORS;  Service: Urology;  Laterality: Left;  . EXTRACORPOREAL SHOCK WAVE LITHOTRIPSY Left 10/30/2015   Procedure: EXTRACORPOREAL SHOCK WAVE LITHOTRIPSY (ESWL);  Surgeon: Royston Cowper, MD;  Location: [facility] ORS;  Service: Urology;  Laterality: Left;  . LITHOTRIPSY  Feb 2016, 2009    Medications:  Current Outpatient Medications on File Prior to Visit  Medication Sig  . amLODipine (NORVASC) 5 MG tablet Take 1  tablet (5 mg total) by mouth daily.  Marland Kitchen aspirin EC 81 MG tablet Take 81 mg by mouth daily.  Marland Kitchen atorvastatin (LIPITOR) 40 MG tablet Take 1 tablet (40 mg total) by mouth daily at 6 PM.  . Cetirizine HCl (ZYRTEC PO) Take by mouth daily.  . Fish Oil OIL 1 capsule by Does not apply route daily.  Marland Kitchen lisinopril (ZESTRIL) 40 MG tablet Take 1 tablet (40 mg total) by mouth daily.  . metoprolol succinate (TOPROL-XL) 50 MG 24 hr tablet Take 1 tablet (50 mg total) by mouth daily. Take with or immediately following a meal.  . Multiple Vitamin (MULTIVITAMIN) capsule Take 1 capsule by mouth daily.  Marland Kitchen  pyridoxine (B-6) 100 MG tablet Take 100 mg by mouth daily.  Marland Kitchen UNABLE TO FIND Fluticasone, nasal spray. 2 times daily  . VITAMIN D PO Take by mouth 2 (two) times daily.   No current facility-administered medications on file prior to visit.     Allergies:  No Known Allergies  Social History:  Social History   Socioeconomic History  . Marital status: Married    Spouse name: Not on file  . Number of children: Not on file  . Years of education: Not on file  . Highest education level: Not on file  Occupational History  . Not on file  Social Needs  . Financial resource strain: Not on file  . Food insecurity    Worry: Not on file    Inability: Not on file  . Transportation needs    Medical: Not on file    Non-medical: Not on file  Tobacco Use  . Smoking status: Never Smoker  . Smokeless tobacco: Never Used  Substance and Sexual Activity  . Alcohol use: No  . Drug use: No  . Sexual activity: Not on file  Lifestyle  . Physical activity    Days per week: Not on file    Minutes per session: Not on file  . Stress: Not on file  Relationships  . Social Herbalist on phone: Not on file    Gets together: Not on file    Attends religious service: Not on file    Active member of club or organization: Not on file    Attends meetings of clubs or organizations: Not on file    Relationship status: Not on file  . Intimate partner violence    Fear of current or ex partner: Not on file    Emotionally abused: Not on file    Physically abused: Not on file    Forced sexual activity: Not on file  Other Topics Concern  . Not on file  Social History Narrative  . Not on file   Social History   Tobacco Use  Smoking Status Never Smoker  Smokeless Tobacco Never Used   Social History   Substance and Sexual Activity  Alcohol Use No    Family History:  Family History  Problem Relation Age of Onset  . Heart disease Mother   . Hyperlipidemia Mother   . Lung disease Mother    . Heart disease Father   . Hyperlipidemia Father   . Heart disease Brother   . Diabetes Brother     Past medical history, surgical history, medications, allergies, family history and social history reviewed with patient today and changes made to appropriate areas of the chart.   Review of Systems -  none All other ROS negative except what is listed above and in the  HPI.      Objective:    BP 138/82 (BP Location: Left Arm, Patient Position: Sitting)   Pulse 72   Temp 98.6 F (37 C) (Oral)   Ht 5' 7.2" (1.707 m)   Wt 209 lb (94.8 kg)   SpO2 98%   BMI 32.54 kg/m   Wt Readings from Last 3 Encounters:  05/28/19 209 lb (94.8 kg)  04/26/18 210 lb (95.3 kg)  02/03/18 210 lb 12.8 oz (95.6 kg)    Physical Exam Vitals signs and nursing note reviewed.  Constitutional:      General: He is awake. He is not in acute distress.    Appearance: He is well-developed. He is not ill-appearing.  HENT:     Head: Normocephalic and atraumatic.     Right Ear: Hearing, tympanic membrane, ear canal and external ear normal. No drainage.     Left Ear: Hearing, tympanic membrane, ear canal and external ear normal. No drainage.     Nose: Nose normal.     Mouth/Throat:     Mouth: Mucous membranes are moist.     Pharynx: Oropharynx is clear. Uvula midline.  Eyes:     General: Lids are normal.        Right eye: No discharge.        Left eye: No discharge.     Extraocular Movements: Extraocular movements intact.     Conjunctiva/sclera: Conjunctivae normal.     Pupils: Pupils are equal, round, and reactive to light.     Visual Fields: Right eye visual fields normal and left eye visual fields normal.  Neck:     Musculoskeletal: Normal range of motion and neck supple.     Thyroid: No thyromegaly.     Vascular: No carotid bruit or JVD.     Trachea: Trachea normal.  Cardiovascular:     Rate and Rhythm: Normal rate and regular rhythm.     Heart sounds: Normal heart sounds, S1 normal and S2 normal. No  murmur. No gallop.   Pulmonary:     Effort: Pulmonary effort is normal. No accessory muscle usage or respiratory distress.     Breath sounds: Normal breath sounds.  Abdominal:     General: Bowel sounds are normal.     Palpations: Abdomen is soft. There is no hepatomegaly or splenomegaly.     Hernia: No hernia is present. There is no hernia in the left inguinal area or right inguinal area.  Genitourinary:    Penis: Normal.      Scrotum/Testes: Normal.     Epididymis:     Right: Normal.     Left: Normal.     Comments: Prostate exam deferred per patient request, wishes to obtain lab. Musculoskeletal: Normal range of motion.     Right lower leg: No edema.     Left lower leg: No edema.  Skin:    General: Skin is warm and dry.     Capillary Refill: Capillary refill takes less than 2 seconds.     Findings: No rash.       Neurological:     Mental Status: He is alert and oriented to person, place, and time.     Cranial Nerves: Cranial nerves are intact.     Gait: Gait is intact.     Deep Tendon Reflexes: Reflexes are normal and symmetric.     Reflex Scores:      Brachioradialis reflexes are 2+ on the right side and 2+ on the left side.  Patellar reflexes are 2+ on the right side and 2+ on the left side. Psychiatric:        Attention and Perception: Attention normal.        Mood and Affect: Mood normal.        Speech: Speech normal.        Behavior: Behavior normal. Behavior is cooperative.        Thought Content: Thought content normal.        Cognition and Memory: Cognition normal.        Judgment: Judgment normal.     Results for orders placed or performed in visit on 10/26/18  LP+ALT+AST Piccolo, [employer]  Result Value Ref Range   ALT (SGPT) Piccolo, Waived 32 10 - 47 U/L   AST (SGOT) Piccolo, Waived 29 11 - 38 U/L   Cholesterol Piccolo, Waived 152 <200 mg/dL   HDL Chol Piccolo, Waived 60 >59 mg/dL   Triglycerides Piccolo,Waived 138 <150 mg/dL   Chol/HDL Ratio  Piccolo,Waive 2.5 mg/dL   LDL Chol Calc Piccolo Waived 64 <100 mg/dL   VLDL Chol Calc Piccolo,Waive 28 <30 mg/dL  Basic metabolic panel  Result Value Ref Range   Glucose 120 (H) 65 - 99 mg/dL   BUN 20 6 - 24 mg/dL   Creatinine, Ser 1.07 0.76 - 1.27 mg/dL   GFR calc non Af Amer 77 >59 mL/min/1.73   GFR calc Af Amer 89 >59 mL/min/1.73   BUN/Creatinine Ratio 19 9 - 20   Sodium 140 134 - 144 mmol/L   Potassium 3.5 3.5 - 5.2 mmol/L   Chloride 98 96 - 106 mmol/L   CO2 22 20 - 29 mmol/L   Calcium 9.2 8.7 - 10.2 mg/dL      Assessment & Plan:   Problem List Items Addressed This Visit      Cardiovascular and Mediastinum   Hypertension    Chronic, stable with initial BP above goal and repeat at goal, suspect some white coat syndrome. Continue current medication regimen.  Labs today.        Other   Hyperlipidemia    Chronic, stable.  Continue current medication regimen and adjust as needed.  Lipid panel today.        Prediabetes    Check A1C today      Mass of skin of back    Continue to monitor.  Return to dermatology as needed.       Other Visit Diagnoses    PE (physical exam), annual    -  Primary       Discussed aspirin prophylaxis for myocardial infarction prevention and decision was made to continue ASA  LABORATORY TESTING:  Health maintenance labs ordered today as discussed above.   The natural history of prostate cancer and ongoing controversy regarding screening and potential treatment outcomes of prostate cancer has been discussed with the patient. The meaning of a false positive PSA and a false negative PSA has been discussed. He indicates understanding of the limitations of this screening test and wishes to proceed with screening PSA testing.   IMMUNIZATIONS:   - Tdap: Tetanus vaccination status reviewed: last tetanus booster within 10 years. - Influenza: Up to date - Pneumovax: Not applicable - Prevnar: Not applicable - Zostavax vaccine: refused today   SCREENING: - Colonoscopy: Up to date  Discussed with patient purpose of the colonoscopy is to detect colon cancer at curable precancerous or early stages   - AAA Screening: Not applicable  -Hearing Test: Not applicable  -  Spirometry: Not applicable   PATIENT COUNSELING:    Sexuality: Discussed sexually transmitted diseases, partner selection, use of condoms, avoidance of unintended pregnancy  and contraceptive alternatives.   Advised to avoid cigarette smoking.  I discussed with the patient that most people either abstain from alcohol or drink within safe limits (<=14/week and <=4 drinks/occasion for males, <=7/weeks and <= 3 drinks/occasion for females) and that the risk for alcohol disorders and other health effects rises proportionally with the number of drinks per week and how often a drinker exceeds daily limits.  Discussed cessation/primary prevention of drug use and availability of treatment for abuse.   Diet: Encouraged to adjust caloric intake to maintain  or achieve ideal body weight, to reduce intake of dietary saturated fat and total fat, to limit sodium intake by avoiding high sodium foods and not adding table salt, and to maintain adequate dietary potassium and calcium preferably from fresh fruits, vegetables, and low-fat dairy products.    stressed the importance of regular exercise  Injury prevention: Discussed safety belts, safety helmets, smoke detector, smoking near bedding or upholstery.   Dental health: Discussed importance of regular tooth brushing, flossing, and dental visits.   Follow up plan: NEXT PREVENTATIVE PHYSICAL DUE IN 1 YEAR. Return in about 6 months (around 11/28/2019) for HTN. and prediabetes.

## 2019-05-28 NOTE — Assessment & Plan Note (Signed)
Continue to monitor.  Return to dermatology as needed.

## 2019-05-29 ENCOUNTER — Encounter: Payer: Self-pay | Admitting: Family Medicine

## 2019-05-29 DIAGNOSIS — M9903 Segmental and somatic dysfunction of lumbar region: Secondary | ICD-10-CM | POA: Diagnosis not present

## 2019-05-29 DIAGNOSIS — M9905 Segmental and somatic dysfunction of pelvic region: Secondary | ICD-10-CM | POA: Diagnosis not present

## 2019-05-29 DIAGNOSIS — M5416 Radiculopathy, lumbar region: Secondary | ICD-10-CM | POA: Diagnosis not present

## 2019-05-29 DIAGNOSIS — M5431 Sciatica, right side: Secondary | ICD-10-CM | POA: Diagnosis not present

## 2019-05-29 LAB — CBC WITH DIFFERENTIAL/PLATELET
Basophils Absolute: 0.1 10*3/uL (ref 0.0–0.2)
Basos: 1 %
EOS (ABSOLUTE): 0.2 10*3/uL (ref 0.0–0.4)
Eos: 2 %
Hematocrit: 49.2 % (ref 37.5–51.0)
Hemoglobin: 16.5 g/dL (ref 13.0–17.7)
Immature Grans (Abs): 0 10*3/uL (ref 0.0–0.1)
Immature Granulocytes: 0 %
Lymphocytes Absolute: 2.4 10*3/uL (ref 0.7–3.1)
Lymphs: 28 %
MCH: 30.4 pg (ref 26.6–33.0)
MCHC: 33.5 g/dL (ref 31.5–35.7)
MCV: 91 fL (ref 79–97)
Monocytes Absolute: 1 10*3/uL — ABNORMAL HIGH (ref 0.1–0.9)
Monocytes: 11 %
Neutrophils Absolute: 5.1 10*3/uL (ref 1.4–7.0)
Neutrophils: 58 %
Platelets: 272 10*3/uL (ref 150–450)
RBC: 5.43 x10E6/uL (ref 4.14–5.80)
RDW: 13.1 % (ref 11.6–15.4)
WBC: 8.7 10*3/uL (ref 3.4–10.8)

## 2019-05-29 LAB — COMPREHENSIVE METABOLIC PANEL
ALT: 39 IU/L (ref 0–44)
AST: 23 IU/L (ref 0–40)
Albumin/Globulin Ratio: 1.7 (ref 1.2–2.2)
Albumin: 4.7 g/dL (ref 3.8–4.9)
Alkaline Phosphatase: 103 IU/L (ref 39–117)
BUN/Creatinine Ratio: 12 (ref 9–20)
BUN: 14 mg/dL (ref 6–24)
Bilirubin Total: 0.3 mg/dL (ref 0.0–1.2)
CO2: 19 mmol/L — ABNORMAL LOW (ref 20–29)
Calcium: 9.5 mg/dL (ref 8.7–10.2)
Chloride: 102 mmol/L (ref 96–106)
Creatinine, Ser: 1.13 mg/dL (ref 0.76–1.27)
GFR calc Af Amer: 83 mL/min/{1.73_m2} (ref >59)
GFR calc non Af Amer: 72 mL/min/{1.73_m2} (ref >59)
Globulin, Total: 2.7 g/dL (ref 1.5–4.5)
Glucose: 89 mg/dL (ref 65–99)
Potassium: 4 mmol/L (ref 3.5–5.2)
Sodium: 145 mmol/L — ABNORMAL HIGH (ref 134–144)
Total Protein: 7.4 g/dL (ref 6.0–8.5)

## 2019-05-29 LAB — URINALYSIS, ROUTINE W REFLEX MICROSCOPIC
Bilirubin, UA: NEGATIVE
Glucose, UA: NEGATIVE
Ketones, UA: NEGATIVE
Leukocytes,UA: NEGATIVE
Nitrite, UA: NEGATIVE
Protein,UA: NEGATIVE
RBC, UA: NEGATIVE
Specific Gravity, UA: 1.02 (ref 1.005–1.030)
Urobilinogen, Ur: 0.2 mg/dL (ref 0.2–1.0)
pH, UA: 7 (ref 5.0–7.5)

## 2019-05-29 LAB — LIPID PANEL
Chol/HDL Ratio: 3.3 ratio (ref 0.0–5.0)
Cholesterol, Total: 158 mg/dL (ref 100–199)
HDL: 48 mg/dL (ref >39)
LDL Calculated: 81 mg/dL (ref 0–99)
Triglycerides: 147 mg/dL (ref 0–149)
VLDL Cholesterol Cal: 29 mg/dL (ref 5–40)

## 2019-05-29 LAB — BAYER DCA HB A1C WAIVED: HB A1C (BAYER DCA - WAIVED): 5.9 % (ref <7.0)

## 2019-05-29 LAB — PSA: Prostate Specific Ag, Serum: 1.7 ng/mL (ref 0.0–4.0)

## 2019-05-29 LAB — TSH: TSH: 1.27 u[IU]/mL (ref 0.450–4.500)

## 2019-06-07 DIAGNOSIS — H35073 Retinal telangiectasis, bilateral: Secondary | ICD-10-CM | POA: Diagnosis not present

## 2019-07-02 DIAGNOSIS — M9903 Segmental and somatic dysfunction of lumbar region: Secondary | ICD-10-CM | POA: Diagnosis not present

## 2019-07-02 DIAGNOSIS — M9905 Segmental and somatic dysfunction of pelvic region: Secondary | ICD-10-CM | POA: Diagnosis not present

## 2019-07-02 DIAGNOSIS — M5431 Sciatica, right side: Secondary | ICD-10-CM | POA: Diagnosis not present

## 2019-07-02 DIAGNOSIS — M5416 Radiculopathy, lumbar region: Secondary | ICD-10-CM | POA: Diagnosis not present

## 2019-08-24 ENCOUNTER — Other Ambulatory Visit: Payer: Self-pay | Admitting: Family Medicine

## 2019-08-24 DIAGNOSIS — I10 Essential (primary) hypertension: Secondary | ICD-10-CM

## 2019-08-24 DIAGNOSIS — E785 Hyperlipidemia, unspecified: Secondary | ICD-10-CM

## 2019-09-03 DIAGNOSIS — M7731 Calcaneal spur, right foot: Secondary | ICD-10-CM | POA: Diagnosis not present

## 2019-09-03 DIAGNOSIS — M722 Plantar fascial fibromatosis: Secondary | ICD-10-CM | POA: Diagnosis not present

## 2019-09-18 DIAGNOSIS — N200 Calculus of kidney: Secondary | ICD-10-CM | POA: Diagnosis not present

## 2019-09-18 DIAGNOSIS — E669 Obesity, unspecified: Secondary | ICD-10-CM | POA: Diagnosis not present

## 2019-09-18 DIAGNOSIS — N23 Unspecified renal colic: Secondary | ICD-10-CM | POA: Diagnosis not present

## 2019-09-18 DIAGNOSIS — N201 Calculus of ureter: Secondary | ICD-10-CM | POA: Diagnosis not present

## 2019-09-20 ENCOUNTER — Encounter
Admission: RE | Disposition: A | Discharge status: Home / Self Care | Payer: Self-pay | Source: Home / Self Care | Attending: Urology

## 2019-09-20 ENCOUNTER — Ambulatory Visit
Admission: RE | Admit: 2019-09-20 | Discharge: 2019-09-20 | Disposition: A | Discharge status: Home / Self Care | Payer: BC Managed Care – PPO | Attending: Urology | Admitting: Urology

## 2019-09-20 ENCOUNTER — Other Ambulatory Visit: Payer: Self-pay

## 2019-09-20 DIAGNOSIS — Z7982 Long term (current) use of aspirin: Secondary | ICD-10-CM | POA: Insufficient documentation

## 2019-09-20 DIAGNOSIS — E7800 Pure hypercholesterolemia, unspecified: Secondary | ICD-10-CM | POA: Diagnosis not present

## 2019-09-20 DIAGNOSIS — N202 Calculus of kidney with calculus of ureter: Secondary | ICD-10-CM | POA: Insufficient documentation

## 2019-09-20 DIAGNOSIS — Z79899 Other long term (current) drug therapy: Secondary | ICD-10-CM | POA: Insufficient documentation

## 2019-09-20 DIAGNOSIS — I10 Essential (primary) hypertension: Secondary | ICD-10-CM | POA: Diagnosis not present

## 2019-09-20 DIAGNOSIS — N201 Calculus of ureter: Secondary | ICD-10-CM | POA: Diagnosis not present

## 2019-09-20 DIAGNOSIS — G4730 Sleep apnea, unspecified: Secondary | ICD-10-CM | POA: Insufficient documentation

## 2019-09-20 DIAGNOSIS — N200 Calculus of kidney: Secondary | ICD-10-CM

## 2019-09-20 HISTORY — PX: EXTRACORPOREAL SHOCK WAVE LITHOTRIPSY: SHX1557

## 2019-09-20 SURGERY — LITHOTRIPSY, ESWL
Anesthesia: Moderate Sedation | Laterality: Left

## 2019-09-20 MED ORDER — MIDAZOLAM HCL 2 MG/2ML IJ SOLN
1.0000 mg | Freq: Once | INTRAMUSCULAR | Status: AC
  Administered 2019-09-20: 14:00:00 1 mg via INTRAMUSCULAR

## 2019-09-20 MED ORDER — FUROSEMIDE 10 MG/ML IJ SOLN
INTRAMUSCULAR | Status: AC
  Administered 2019-09-20: 10 mg via INTRAVENOUS
  Filled 2019-09-20: qty 2

## 2019-09-20 MED ORDER — LEVOFLOXACIN 500 MG PO TABS
ORAL_TABLET | ORAL | Status: AC
  Administered 2019-09-20: 500 mg via ORAL
  Filled 2019-09-20: qty 1

## 2019-09-20 MED ORDER — MIDAZOLAM HCL 2 MG/2ML IJ SOLN
INTRAMUSCULAR | Status: AC
  Administered 2019-09-20: 1 mg via INTRAMUSCULAR
  Filled 2019-09-20: qty 2

## 2019-09-20 MED ORDER — MORPHINE SULFATE (PF) 10 MG/ML IV SOLN
10.0000 mg | Freq: Once | INTRAVENOUS | Status: AC
  Administered 2019-09-20: 14:00:00 10 mg via INTRAMUSCULAR

## 2019-09-20 MED ORDER — LEVOFLOXACIN 500 MG PO TABS
500.0000 mg | ORAL_TABLET | ORAL | Status: AC
  Administered 2019-09-20: 14:00:00 500 mg via ORAL

## 2019-09-20 MED ORDER — FUROSEMIDE 10 MG/ML IJ SOLN
10.0000 mg | Freq: Once | INTRAMUSCULAR | Status: AC
  Administered 2019-09-20: 17:00:00 10 mg via INTRAVENOUS

## 2019-09-20 MED ORDER — DIPHENHYDRAMINE HCL 25 MG PO CAPS
25.0000 mg | ORAL_CAPSULE | ORAL | Status: AC
  Administered 2019-09-20: 14:00:00 25 mg via ORAL

## 2019-09-20 MED ORDER — ONDANSETRON HCL 4 MG PO TABS: 4.0000 mg | ORAL_TABLET | Freq: Three times a day (TID) | ORAL | 2 refills | Status: DC | PRN

## 2019-09-20 MED ORDER — CIPROFLOXACIN HCL 500 MG PO TABS: 500.0000 mg | ORAL_TABLET | Freq: Two times a day (BID) | ORAL | 0 refills | Status: DC

## 2019-09-20 MED ORDER — PROMETHAZINE HCL 25 MG/ML IJ SOLN
25.0000 mg | Freq: Once | INTRAMUSCULAR | Status: AC
  Administered 2019-09-20: 14:00:00 25 mg via INTRAMUSCULAR

## 2019-09-20 MED ORDER — TAMSULOSIN HCL 0.4 MG PO CAPS: 0.4000 mg | ORAL_CAPSULE | Freq: Every day | ORAL | 1 refills | Status: DC

## 2019-09-20 MED ORDER — MORPHINE SULFATE (PF) 10 MG/ML IV SOLN
INTRAVENOUS | Status: AC
  Administered 2019-09-20: 10 mg via INTRAMUSCULAR
  Filled 2019-09-20: qty 1

## 2019-09-20 MED ORDER — HYDROCODONE-ACETAMINOPHEN 10-325 MG PO TABS: 1.0000 | ORAL_TABLET | Freq: Four times a day (QID) | ORAL | 0 refills | Status: DC | PRN

## 2019-09-20 MED ORDER — DEXTROSE-NACL 5-0.45 % IV SOLN
INTRAVENOUS | Status: DC
  Administered 2019-09-20: 14:00:00 via INTRAVENOUS

## 2019-09-20 MED ORDER — DOCUSATE SODIUM 100 MG PO CAPS: 200.0000 mg | ORAL_CAPSULE | Freq: Two times a day (BID) | ORAL | 3 refills | Status: DC

## 2019-09-20 MED ORDER — SODIUM CHLORIDE FLUSH 0.9 % IV SOLN
INTRAVENOUS | Status: AC
  Administered 2019-09-20: 17:00:00
  Filled 2019-09-20: qty 10

## 2019-09-20 MED ORDER — PROMETHAZINE HCL 25 MG/ML IJ SOLN
INTRAMUSCULAR | Status: AC
  Administered 2019-09-20: 25 mg via INTRAMUSCULAR
  Filled 2019-09-20: qty 1

## 2019-09-20 MED ORDER — DIPHENHYDRAMINE HCL 25 MG PO CAPS
ORAL_CAPSULE | ORAL | Status: AC
  Administered 2019-09-20: 14:00:00 25 mg via ORAL
  Filled 2019-09-20: qty 1

## 2019-09-20 NOTE — Discharge Instructions (Addendum)
AMBULATORY SURGERY  DISCHARGE INSTRUCTIONS   1) The drugs that you were given will stay in your system until tomorrow so for the next 24 hours you should not:  A) Drive an automobile B) Make any legal decisions C) Drink any alcoholic beverage   2) You may resume regular meals tomorrow.  Today it is better to start with liquids and gradually work up to solid foods.  You may eat anything you prefer, but it is better to start with liquids, then soup and crackers, and gradually work up to solid foods.   3) Please notify your doctor immediately if you have any unusual bleeding, trouble breathing, redness and pain at the surgery site, drainage, fever, or pain not relieved by medication.    4) Additional Instructions:        Please contact your physician with any problems or Same Day Surgery at [phone], Monday through Friday 6 am to 4 pm, or [facility] number at [phone].Lithotripsy, Care After This sheet gives you information about how to care for yourself after your procedure. Your health care provider may also give you more specific instructions. If you have problems or questions, contact your health care provider. What can I expect after the procedure? After the procedure, it is common to have:  Some blood in your urine. This should only last for a few days.  Soreness in your back, sides, or upper abdomen for a few days.  Blotches or bruises on your back where the pressure wave entered the skin.  Pain, discomfort, or nausea when pieces (fragments) of the kidney stone move through the tube that carries urine from the kidney to the bladder (ureter). Stone fragments may pass soon after the procedure, but they may continue to pass for up to 4-8 weeks. ? If you have severe pain or nausea, contact your health care provider. This may be caused by a large stone that was not broken up, and this may mean that you need more treatment.  Some pain or discomfort  during urination.  Some pain or discomfort in the lower abdomen or (in men) at the base of the penis. Follow these instructions at home: Medicines  Take over-the-counter and prescription medicines only as told by your health care provider.  If you were prescribed an antibiotic medicine, take it as told by your health care provider. Do not stop taking the antibiotic even if you start to feel better.  Do not drive for 24 hours if you were given a medicine to help you relax (sedative).  Do not drive or use heavy machinery while taking prescription pain medicine. Eating and drinking      Drink enough water and fluids to keep your urine clear or pale yellow. This helps any remaining pieces of the stone to pass. It can also help prevent new stones from forming.  Eat plenty of fresh fruits and vegetables.  Follow instructions from your health care provider about eating and drinking restrictions. You may be instructed: ? To reduce how much salt (sodium) you eat or drink. Check ingredients and nutrition facts on packaged foods and beverages. ? To reduce how much meat you eat.  Eat the recommended amount of calcium for your age and gender. Ask your health care provider how much calcium you should have. General instructions  Get plenty of rest.  Most people can resume normal activities 1-2 days after the procedure. Ask your health care provider what activities are safe for you.  Your  health care provider may direct you to lie in a certain position (postural drainage) and tap firmly (percuss) over your kidney area to help stone fragments pass. Follow instructions as told by your health care provider.  If directed, strain all urine through the strainer that was provided by your health care provider. ? Keep all fragments for your health care provider to see. Any stones that are found may be sent to a medical lab for examination. The stone may be as small as a grain of salt.  Keep all  follow-up visits as told by your health care provider. This is important. Contact a health care provider if:  You have pain that is severe or does not get better with medicine.  You have nausea that is severe or does not go away.  You have blood in your urine longer than your health care provider told you to expect.  You have more blood in your urine.  You have pain during urination that does not go away.  You urinate more frequently than usual and this does not go away.  You develop a rash or any other possible signs of an allergic reaction. Get help right away if:  You have severe pain in your back, sides, or upper abdomen.  You have severe pain while urinating.  Your urine is very dark red.  You have blood in your stool (feces).  You cannot pass any urine at all.  You feel a strong urge to urinate after emptying your bladder.  You have a fever or chills.  You develop shortness of breath, difficulty breathing, or chest pain.  You have severe nausea that leads to persistent vomiting.  You faint. Summary  After this procedure, it is common to have some pain, discomfort, or nausea when pieces (fragments) of the kidney stone move through the tube that carries urine from the kidney to the bladder (ureter). If this pain or nausea is severe, however, you should contact your health care provider.  Most people can resume normal activities 1-2 days after the procedure. Ask your health care provider what activities are safe for you.  Drink enough water and fluids to keep your urine clear or pale yellow. This helps any remaining pieces of the stone to pass, and it can help prevent new stones from forming.  If directed, strain your urine and keep all fragments for your health care provider to see. Fragments or stones may be as small as a grain of salt.  Get help right away if you have severe pain in your back, sides, or upper abdomen or have severe pain while urinating. This  information is not intended to replace advice given to you by your health care provider. Make sure you discuss any questions you have with your health care provider. Document Released: 11/14/2007 Document Revised: 02/05/2019 Document Reviewed: 09/15/2016 Elsevier Patient Education  2020 [employer].

## 2019-09-24 ENCOUNTER — Encounter: Payer: Self-pay | Admitting: Urology

## 2019-10-03 DIAGNOSIS — N401 Enlarged prostate with lower urinary tract symptoms: Secondary | ICD-10-CM | POA: Diagnosis not present

## 2019-10-03 DIAGNOSIS — N200 Calculus of kidney: Secondary | ICD-10-CM | POA: Diagnosis not present

## 2019-10-03 DIAGNOSIS — N23 Unspecified renal colic: Secondary | ICD-10-CM | POA: Diagnosis not present

## 2019-10-03 DIAGNOSIS — N201 Calculus of ureter: Secondary | ICD-10-CM | POA: Diagnosis not present

## 2019-10-16 NOTE — Telephone Encounter (Signed)
[facility] to check on Sleep Study, patient was not in the system, they just changed over in March to a new EMR, she will send a request to their medical records department to see if he has had one. She states that the turn around is a couple of days.

## 2019-10-17 ENCOUNTER — Other Ambulatory Visit: Payer: Self-pay | Admitting: Nurse Practitioner

## 2019-10-17 DIAGNOSIS — Z9989 Dependence on other enabling machines and devices: Secondary | ICD-10-CM

## 2019-10-17 NOTE — Telephone Encounter (Signed)
Could you place the referral as urgent to [facility] so that the referral team could possible get that scheduled for him, I will reach out to the patient and let him know that we are just going to go ahead with a new sleep study, so hopefully he can get a new machine before the end of the year.

## 2019-10-17 NOTE — Telephone Encounter (Signed)
Since his machine and sleep study is 58 years old, do you think that we need to go ahead and start the process of getting a new sleep study, or just wait until they let us know about the previous one through [facility]?

## 2019-10-18 NOTE — Addendum Note (Signed)
Addended by: Marnee Guarneri T on: 10/18/2019 07:41 AM   Modules accepted: Orders

## 2019-11-29 ENCOUNTER — Encounter: Payer: Self-pay | Admitting: Nurse Practitioner

## 2019-11-29 ENCOUNTER — Ambulatory Visit: Payer: BC Managed Care – PPO | Admitting: Nurse Practitioner

## 2019-11-29 DIAGNOSIS — E782 Mixed hyperlipidemia: Secondary | ICD-10-CM

## 2019-11-29 DIAGNOSIS — I10 Essential (primary) hypertension: Secondary | ICD-10-CM

## 2019-11-29 DIAGNOSIS — R7303 Prediabetes: Secondary | ICD-10-CM | POA: Diagnosis not present

## 2019-11-29 DIAGNOSIS — Z9989 Dependence on other enabling machines and devices: Secondary | ICD-10-CM

## 2019-11-29 DIAGNOSIS — G4733 Obstructive sleep apnea (adult) (pediatric): Secondary | ICD-10-CM | POA: Diagnosis not present

## 2019-11-29 LAB — MICROALBUMIN, URINE WAIVED
Creatinine, Urine Waived: 200 mg/dL (ref 10–300)
Microalb, Ur Waived: 30 mg/L — ABNORMAL HIGH (ref 0–19)
Microalb/Creat Ratio: <30 mg/g (ref <30)

## 2019-11-29 LAB — LIPID PANEL PICCOLO, WAIVED
Chol/HDL Ratio Piccolo,Waive: 2.7 mg/dL
Cholesterol Piccolo, Waived: 151 mg/dL (ref <200)
HDL Chol Piccolo, Waived: 56 mg/dL — ABNORMAL LOW (ref >59)
LDL Chol Calc Piccolo Waived: 69 mg/dL (ref <100)
Triglycerides Piccolo,Waived: 128 mg/dL (ref <150)
VLDL Chol Calc Piccolo,Waive: 26 mg/dL (ref <30)

## 2019-11-29 LAB — BAYER DCA HB A1C WAIVED: HB A1C (BAYER DCA - WAIVED): 5.7 % (ref <7.0)

## 2019-11-29 MED ORDER — ATORVASTATIN CALCIUM 40 MG PO TABS: ORAL_TABLET | ORAL | 3 refills | Status: DC

## 2019-11-29 MED ORDER — AMLODIPINE BESYLATE 5 MG PO TABS: 5.0000 mg | ORAL_TABLET | Freq: Every day | ORAL | 3 refills | Status: DC

## 2019-11-29 NOTE — Assessment & Plan Note (Signed)
Chronic, compliant with CPAP regimen.  Praised for 100% use.

## 2019-11-29 NOTE — Assessment & Plan Note (Signed)
Chronic, ongoing with BP at recent visits at goal.  Recommend he check BP at home three mornings a week and document.  Continue current medication regimen and adjust as needed.  Obtain outpatient BMP.  Refills sent.  Return in 6 months for annual physical.

## 2019-11-29 NOTE — Assessment & Plan Note (Signed)
Chronic, stable.  Continue current medication regimen and adjust as needed.  Lipid panel outpatient.  Refills sent.

## 2019-11-29 NOTE — Progress Notes (Signed)
There were no vitals taken for this visit.   Subjective:    Patient ID: Craig Ryan, male    DOB: Oct 02, 1961, 59 y.o.   MRN: [id number]  HPI: Craig Ryan is a 59 y.o. male  Chief Complaint  Patient presents with  . Hypertension    . This visit was completed via telephone due to the restrictions of the COVID-19 pandemic. All issues as above were discussed and addressed but no physical exam was performed. If it was felt that the patient should be evaluated in the office, they were directed there. The patient verbally consented to this visit. Patient was unable to complete an audio/visual visit due to Lack of equipment. Due to the catastrophic nature of the COVID-19 pandemic, this visit was done through audio contact only. . Location of the patient: home . Location of the provider: home . Those involved with this call:  . Provider: Marnee Guarneri, DNP . CMA: Yvonna Alanis, CMA . Front Desk/Registration: Don Perking  . Time spent on call: 15 minutes on the phone discussing health concerns. 10 minutes total spent in review of patient's record and preparation of their chart.  . I verified patient identity using two factors (patient name and date of birth). Patient consents verbally to being seen via telemedicine visit today.    HYPERTENSION / HYPERLIPIDEMIA Continues on Amlodipine 5 MG daily, Lisinopril 40 MG daily, and Metoprolol 50 MG daily + ASA.  Takes Atorvastatin 40 MG for HLD.  Recent July 2020 labs with LDL 81 and TCHOL 158.  Has underlying OSA and just obtained new CPAP, uses 100% of the time. Satisfied with current treatment? yes Duration of hypertension: chronic BP monitoring frequency: not checking BP range: none BP medication side effects: no Duration of hyperlipidemia: chronic Cholesterol medication side effects: no Cholesterol supplements: none Medication compliance: good compliance Aspirin: yes Recent stressors: none Recurrent headaches:  no Visual changes: no Palpitations: no Dyspnea: no Chest pain: no Lower extremity edema: no Dizzy/lightheaded: no   PREDIABETES: On review A1C have been 5.4 to 5.7 since 2017, in July 2020 A1C was 5.9.  He denies polyuria, polyphagia, polydipsia.   Polydipsia/polyuria: no Visual disturbance: no Chest pain: no Paresthesias: no  Relevant past medical, surgical, family and social history reviewed and updated as indicated. Interim medical history since our last visit reviewed. Allergies and medications reviewed and updated.  Review of Systems  Constitutional: Negative for activity change, diaphoresis, fatigue and fever.  Respiratory: Negative for cough, chest tightness, shortness of breath and wheezing.   Cardiovascular: Negative for chest pain, palpitations and leg swelling.  Gastrointestinal: Negative.   Endocrine: Negative for cold intolerance, heat intolerance, polydipsia, polyphagia and polyuria.  Neurological: Negative.   Psychiatric/Behavioral: Negative.     Per HPI unless specifically indicated above     Objective:    There were no vitals taken for this visit.  Wt Readings from Last 3 Encounters:  05/28/19 209 lb (94.8 kg)  04/26/18 210 lb (95.3 kg)  02/03/18 210 lb 12.8 oz (95.6 kg)    Physical Exam   Unable to perform due to telephone visit only.  Results for orders placed or performed in visit on 05/28/19  PSA  Result Value Ref Range   Prostate Specific Ag, Serum 1.7 0.0 - 4.0 ng/mL  Urinalysis, Routine w reflex microscopic  Result Value Ref Range   Specific Gravity, UA 1.020 1.005 - 1.030   pH, UA 7.0 5.0 - 7.5   Color, UA Yellow Yellow  Appearance Ur Clear Clear   Leukocytes,UA Negative Negative   Protein,UA Negative Negative/Trace   Glucose, UA Negative Negative   Ketones, UA Negative Negative   RBC, UA Negative Negative   Bilirubin, UA Negative Negative   Urobilinogen, Ur 0.2 0.2 - 1.0 mg/dL   Nitrite, UA Negative Negative  TSH  Result Value  Ref Range   TSH 1.270 0.450 - 4.500 uIU/mL  CBC with Differential/Platelet  Result Value Ref Range   WBC 8.7 3.4 - 10.8 x10E3/uL   RBC 5.43 4.14 - 5.80 x10E6/uL   Hemoglobin 16.5 13.0 - 17.7 g/dL   Hematocrit 49.2 37.5 - 51.0 %   MCV 91 79 - 97 fL   MCH 30.4 26.6 - 33.0 pg   MCHC 33.5 31.5 - 35.7 g/dL   RDW 13.1 11.6 - 15.4 %   Platelets 272 150 - 450 x10E3/uL   Neutrophils 58 Not Estab. %   Lymphs 28 Not Estab. %   Monocytes 11 Not Estab. %   Eos 2 Not Estab. %   Basos 1 Not Estab. %   Neutrophils Absolute 5.1 1.4 - 7.0 x10E3/uL   Lymphocytes Absolute 2.4 0.7 - 3.1 x10E3/uL   Monocytes Absolute 1.0 (H) 0.1 - 0.9 x10E3/uL   EOS (ABSOLUTE) 0.2 0.0 - 0.4 x10E3/uL   Basophils Absolute 0.1 0.0 - 0.2 x10E3/uL   Immature Granulocytes 0 Not Estab. %   Immature Grans (Abs) 0.0 0.0 - 0.1 x10E3/uL  Lipid panel  Result Value Ref Range   Cholesterol, Total 158 100 - 199 mg/dL   Triglycerides 147 0 - 149 mg/dL   HDL 48 >39 mg/dL   VLDL Cholesterol Cal 29 5 - 40 mg/dL   LDL Calculated 81 0 - 99 mg/dL   Chol/HDL Ratio 3.3 0.0 - 5.0 ratio  Comprehensive metabolic panel  Result Value Ref Range   Glucose 89 65 - 99 mg/dL   BUN 14 6 - 24 mg/dL   Creatinine, Ser 1.13 0.76 - 1.27 mg/dL   GFR calc non Af Amer 72 >59 mL/min/1.73   GFR calc Af Amer 83 >59 mL/min/1.73   BUN/Creatinine Ratio 12 9 - 20   Sodium 145 (H) 134 - 144 mmol/L   Potassium 4.0 3.5 - 5.2 mmol/L   Chloride 102 96 - 106 mmol/L   CO2 19 (L) 20 - 29 mmol/L   Calcium 9.5 8.7 - 10.2 mg/dL   Total Protein 7.4 6.0 - 8.5 g/dL   Albumin 4.7 3.8 - 4.9 g/dL   Globulin, Total 2.7 1.5 - 4.5 g/dL   Albumin/Globulin Ratio 1.7 1.2 - 2.2   Bilirubin Total 0.3 0.0 - 1.2 mg/dL   Alkaline Phosphatase 103 39 - 117 IU/L   AST 23 0 - 40 IU/L   ALT 39 0 - 44 IU/L  Bayer DCA Hb A1c Waived  Result Value Ref Range   HB A1C (BAYER DCA - WAIVED) 5.9 <7.0 %      Assessment & Plan:   Problem List Items Addressed This Visit       Cardiovascular and Mediastinum   Hypertension - Primary    Chronic, ongoing with BP at recent visits at goal.  Recommend he check BP at home three mornings a week and document.  Continue current medication regimen and adjust as needed.  Obtain outpatient BMP.  Refills sent.  Return in 6 months for annual physical.      Relevant Medications   amLODipine (NORVASC) 5 MG tablet   atorvastatin (LIPITOR) 40 MG  tablet   Other Relevant Orders   Microalbumin, Urine Waived   Basic Metabolic Panel (BMET)     Respiratory   OSA on CPAP    Chronic, compliant with CPAP regimen.  Praised for 100% use.        Other   Hyperlipidemia    Chronic, stable.  Continue current medication regimen and adjust as needed.  Lipid panel outpatient.  Refills sent.        Relevant Medications   amLODipine (NORVASC) 5 MG tablet   atorvastatin (LIPITOR) 40 MG tablet   Other Relevant Orders   Lipid Panel Piccolo, Waived   Prediabetes    Last A1C 5.9%.  Will recheck this today and continue to recommend diet focus.  If elevation 6.5% or greater will consider initiation of medication.      Relevant Orders   Bayer DCA Hb A1c Waived   Microalbumin, Urine Waived      I discussed the assessment and treatment plan with the patient. The patient was provided an opportunity to ask questions and all were answered. The patient agreed with the plan and demonstrated an understanding of the instructions.   The patient was advised to call back or seek an in-person evaluation if the symptoms worsen or if the condition fails to improve as anticipated.   I provided 15 minutes of time during this encounter.  Follow up plan: Return in about 6 months (around 05/28/2020) for Annual physical.

## 2019-11-29 NOTE — Patient Instructions (Signed)

## 2019-11-29 NOTE — Assessment & Plan Note (Signed)
Last A1C 5.9%.  Will recheck this today and continue to recommend diet focus.  If elevation 6.5% or greater will consider initiation of medication.

## 2019-11-29 NOTE — Progress Notes (Signed)
Contacted via MyChart

## 2019-11-30 LAB — BASIC METABOLIC PANEL
BUN/Creatinine Ratio: 14 (ref 9–20)
BUN: 15 mg/dL (ref 6–24)
CO2: 24 mmol/L (ref 20–29)
Calcium: 9.2 mg/dL (ref 8.7–10.2)
Chloride: 106 mmol/L (ref 96–106)
Creatinine, Ser: 1.06 mg/dL (ref 0.76–1.27)
GFR calc Af Amer: 89 mL/min/{1.73_m2} (ref >59)
GFR calc non Af Amer: 77 mL/min/{1.73_m2} (ref >59)
Glucose: 93 mg/dL (ref 65–99)
Potassium: 4.2 mmol/L (ref 3.5–5.2)
Sodium: 144 mmol/L (ref 134–144)

## 2019-11-30 NOTE — Progress Notes (Signed)
Contacted via MyChart

## 2019-12-04 DIAGNOSIS — M5431 Sciatica, right side: Secondary | ICD-10-CM | POA: Diagnosis not present

## 2019-12-04 DIAGNOSIS — M9903 Segmental and somatic dysfunction of lumbar region: Secondary | ICD-10-CM | POA: Diagnosis not present

## 2019-12-04 DIAGNOSIS — M5416 Radiculopathy, lumbar region: Secondary | ICD-10-CM | POA: Diagnosis not present

## 2019-12-04 DIAGNOSIS — M9905 Segmental and somatic dysfunction of pelvic region: Secondary | ICD-10-CM | POA: Diagnosis not present

## 2019-12-17 DIAGNOSIS — M9905 Segmental and somatic dysfunction of pelvic region: Secondary | ICD-10-CM | POA: Diagnosis not present

## 2019-12-17 DIAGNOSIS — M9903 Segmental and somatic dysfunction of lumbar region: Secondary | ICD-10-CM | POA: Diagnosis not present

## 2019-12-17 DIAGNOSIS — M5431 Sciatica, right side: Secondary | ICD-10-CM | POA: Diagnosis not present

## 2019-12-17 DIAGNOSIS — M5416 Radiculopathy, lumbar region: Secondary | ICD-10-CM | POA: Diagnosis not present

## 2019-12-30 DIAGNOSIS — G4733 Obstructive sleep apnea (adult) (pediatric): Secondary | ICD-10-CM | POA: Diagnosis not present

## 2020-01-01 DIAGNOSIS — N200 Calculus of kidney: Secondary | ICD-10-CM | POA: Diagnosis not present

## 2020-01-01 DIAGNOSIS — N401 Enlarged prostate with lower urinary tract symptoms: Secondary | ICD-10-CM | POA: Diagnosis not present

## 2020-01-01 DIAGNOSIS — M5416 Radiculopathy, lumbar region: Secondary | ICD-10-CM | POA: Diagnosis not present

## 2020-01-01 DIAGNOSIS — M9903 Segmental and somatic dysfunction of lumbar region: Secondary | ICD-10-CM | POA: Diagnosis not present

## 2020-01-01 DIAGNOSIS — M9905 Segmental and somatic dysfunction of pelvic region: Secondary | ICD-10-CM | POA: Diagnosis not present

## 2020-01-01 DIAGNOSIS — M5431 Sciatica, right side: Secondary | ICD-10-CM | POA: Diagnosis not present

## 2020-01-11 ENCOUNTER — Encounter: Payer: Self-pay | Admitting: Nurse Practitioner

## 2020-01-21 ENCOUNTER — Encounter: Payer: Self-pay | Admitting: Nurse Practitioner

## 2020-01-24 ENCOUNTER — Encounter: Payer: Self-pay | Admitting: Nurse Practitioner

## 2020-01-25 ENCOUNTER — Telehealth (INDEPENDENT_AMBULATORY_CARE_PROVIDER_SITE_OTHER): Payer: BC Managed Care – PPO | Admitting: Unknown Physician Specialty

## 2020-01-25 ENCOUNTER — Other Ambulatory Visit: Payer: Self-pay | Admitting: Unknown Physician Specialty

## 2020-01-25 ENCOUNTER — Other Ambulatory Visit: Payer: Self-pay

## 2020-01-25 DIAGNOSIS — U071 COVID-19: Secondary | ICD-10-CM | POA: Diagnosis not present

## 2020-01-25 DIAGNOSIS — I10 Essential (primary) hypertension: Secondary | ICD-10-CM

## 2020-01-25 DIAGNOSIS — J0100 Acute maxillary sinusitis, unspecified: Secondary | ICD-10-CM | POA: Diagnosis not present

## 2020-01-25 MED ORDER — SODIUM CHLORIDE 0.9 % IV SOLN
700.0000 mg | Freq: Once | INTRAVENOUS | Status: AC
  Administered 2020-01-26: 700 mg via INTRAVENOUS
  Filled 2020-01-25: qty 700

## 2020-01-25 MED ORDER — AZITHROMYCIN 250 MG PO TABS: ORAL_TABLET | ORAL | 0 refills | Status: DC

## 2020-01-25 NOTE — Progress Notes (Signed)
  I connected by phone with Craig Ryan on 01/25/2020 at 4:19 PM to discuss the potential use of an new treatment for mild to moderate COVID-19 viral infection in non-hospitalized patients.  This patient is a 59 y.o. male that meets the FDA criteria for Emergency Use Authorization of bamlanivimab or casirivimab\imdevimab.  Has a (+) direct SARS-CoV-2 viral test result  Has mild or moderate COVID-19   Is ? 59 years of age and weighs ? 40 kg  Is NOT hospitalized due to COVID-19  Is NOT requiring oxygen therapy or requiring an increase in baseline oxygen flow rate due to COVID-19  Is within 10 days of symptom onset  Has at least one of the high risk factor(s) for progression to severe COVID-19 and/or hospitalization as defined in EUA.  Specific high risk criteria : Hypertension   I have spoken and communicated the following to the patient or parent/caregiver:  1. FDA has authorized the emergency use of bamlanivimab and casirivimab\imdevimab for the treatment of mild to moderate COVID-19 in adults and pediatric patients with positive results of direct SARS-CoV-2 viral testing who are 4 years of age and older weighing at least 40 kg, and who are at high risk for progressing to severe COVID-19 and/or hospitalization.  2. The significant known and potential risks and benefits of bamlanivimab and casirivimab\imdevimab, and the extent to which such potential risks and benefits are unknown.  3. Information on available alternative treatments and the risks and benefits of those alternatives, including clinical trials.  4. Patients treated with bamlanivimab and casirivimab\imdevimab should continue to self-isolate and use infection control measures (e.g., wear mask, isolate, social distance, avoid sharing personal items, clean and disinfect "high touch" surfaces, and frequent handwashing) according to CDC guidelines.   5. The patient or parent/caregiver has the option to accept or refuse  bamlanivimab or casirivimab\imdevimab .  After reviewing this information with the patient, The patient agreed to proceed with receiving the bamlanimivab infusion and will be provided a copy of the Fact sheet prior to receiving the infusion.[name] 01/25/2020 4:19 PM

## 2020-01-25 NOTE — Progress Notes (Signed)
There were no vitals taken for this visit.   Subjective:    Patient ID: Craig Ryan, male    DOB: June 23, 1961, 59 y.o.   MRN: [id number]  HPI: GUENTHER Ryan is a 59 y.o. male  Chief Complaint  Patient presents with  . URI    pt states he started having cough, fever, chills, and congestion on Sunday    URI  This is a new (Symptoms started Monday) problem. The problem has been waxing and waning. Maximum temperature: fever and chills now resolved. Associated symptoms include congestion, coughing, ear pain, headaches, a plugged ear sensation, rhinorrhea and sinus pain. Pertinent negatives include no abdominal pain or chest pain. He has tried nothing for the symptoms.  Day 5 of illness.  Has had 2 positive rapid covid tests but PCRs are negative.     Relevant past medical, surgical, family and social history reviewed and updated as indicated. Interim medical history since our last visit reviewed. Allergies and medications reviewed and updated.  Review of Systems  HENT: Positive for congestion, ear pain, rhinorrhea and sinus pain.   Respiratory: Positive for cough.   Cardiovascular: Negative for chest pain.  Gastrointestinal: Negative for abdominal pain.  Neurological: Positive for headaches.    Per HPI unless specifically indicated above     Objective:    There were no vitals taken for this visit.  Wt Readings from Last 3 Encounters:  05/28/19 209 lb (94.8 kg)  04/26/18 210 lb (95.3 kg)  02/03/18 210 lb 12.8 oz (95.6 kg)    Physical Exam Constitutional:      General: He is not in acute distress.    Appearance: Normal appearance. He is well-developed.  HENT:     Head: Normocephalic and atraumatic.     Right Ear: Tympanic membrane and ear canal normal.     Left Ear: Tympanic membrane and ear canal normal.     Nose: Mucosal edema and congestion present.     Right Sinus: Maxillary sinus tenderness present.     Left Sinus: Maxillary sinus tenderness present.  Eyes:       General: Lids are normal. No scleral icterus.       Right eye: No discharge.        Left eye: No discharge.     Conjunctiva/sclera: Conjunctivae normal.  Neck:     Vascular: No carotid bruit.  Cardiovascular:     Rate and Rhythm: Normal rate and regular rhythm.  Pulmonary:     Effort: Pulmonary effort is normal. No respiratory distress.  Abdominal:     General: Bowel sounds are normal. There is no distension.     Palpations: There is no hepatomegaly or splenomegaly.     Tenderness: There is no abdominal tenderness.  Musculoskeletal:        General: Normal range of motion.     Cervical back: No rigidity or tenderness.  Lymphadenopathy:     Cervical: No cervical adenopathy.  Skin:    Coloration: Skin is not pale.     Findings: No rash.  Neurological:     Mental Status: He is alert and oriented to person, place, and time.  Psychiatric:        Behavior: Behavior normal.        Thought Content: Thought content normal.        Judgment: Judgment normal.     Results for orders placed or performed in visit on 123XX123  Basic Metabolic Panel (BMET)  Result Value Ref Range  Glucose 93 65 - 99 mg/dL   BUN 15 6 - 24 mg/dL   Creatinine, Ser 1.06 0.76 - 1.27 mg/dL   GFR calc non Af Amer 77 >59 mL/min/1.73   GFR calc Af Amer 89 >59 mL/min/1.73   BUN/Creatinine Ratio 14 9 - 20   Sodium 144 134 - 144 mmol/L   Potassium 4.2 3.5 - 5.2 mmol/L   Chloride 106 96 - 106 mmol/L   CO2 24 20 - 29 mmol/L   Calcium 9.2 8.7 - 10.2 mg/dL  Lipid Panel Piccolo, Waived  Result Value Ref Range   Cholesterol Piccolo, Waived 151 <200 mg/dL   HDL Chol Piccolo, Waived 56 (L) >59 mg/dL   Triglycerides Piccolo,Waived 128 <150 mg/dL   Chol/HDL Ratio Piccolo,Waive 2.7 mg/dL   LDL Chol Calc Piccolo Waived 69 <100 mg/dL   VLDL Chol Calc Piccolo,Waive 26 <30 mg/dL  Microalbumin, Urine Waived  Result Value Ref Range   Microalb, Ur Waived 30 (H) 0 - 19 mg/L   Creatinine, Urine Waived 200 10 - 300 mg/dL    Microalb/Creat Ratio <30 <30 mg/g  Bayer DCA Hb A1c Waived  Result Value Ref Range   HB A1C (BAYER DCA - WAIVED) 5.7 <7.0 %      Assessment & Plan:   Problem List Items Addressed This Visit    None    Visit Diagnoses    COVID-19    -  Primary   Pt with 2 positive rapid Covid tests.  Negative PCR but classic symptoms.  Day 5 of illness. Refer for mab infusion   Relevant Medications   azithromycin (ZITHROMAX Z-PAK) 250 MG tablet   Acute non-recurrent maxillary sinusitis       Pt states some symptoms are typical for his sinus infections.  Rx for Z pack.  Pt understands this will not help Covid   Relevant Medications   azithromycin (ZITHROMAX Z-PAK) 250 MG tablet       Follow up plan: Follow up at mab infusion center.  Orders entered.

## 2020-01-26 ENCOUNTER — Ambulatory Visit (HOSPITAL_COMMUNITY)
Admission: RE | Admit: 2020-01-26 | Discharge: 2020-01-26 | Disposition: A | Discharge status: Home / Self Care | Payer: BC Managed Care – PPO | Source: Ambulatory Visit | Attending: Pulmonary Disease | Admitting: Pulmonary Disease

## 2020-01-26 DIAGNOSIS — U071 COVID-19: Secondary | ICD-10-CM | POA: Diagnosis not present

## 2020-01-26 DIAGNOSIS — I10 Essential (primary) hypertension: Secondary | ICD-10-CM | POA: Insufficient documentation

## 2020-01-26 MED ORDER — FAMOTIDINE IN NACL 20-0.9 MG/50ML-% IV SOLN: 20.0000 mg | Freq: Once | INTRAVENOUS | Status: DC | PRN

## 2020-01-26 MED ORDER — ALBUTEROL SULFATE HFA 108 (90 BASE) MCG/ACT IN AERS: 2.0000 | INHALATION_SPRAY | Freq: Once | RESPIRATORY_TRACT | Status: DC | PRN

## 2020-01-26 MED ORDER — METHYLPREDNISOLONE SODIUM SUCC 125 MG IJ SOLR: 125.0000 mg | Freq: Once | INTRAMUSCULAR | Status: DC | PRN

## 2020-01-26 MED ORDER — SODIUM CHLORIDE 0.9 % IV SOLN: INTRAVENOUS | Status: DC | PRN

## 2020-01-26 MED ORDER — EPINEPHRINE 0.3 MG/0.3ML IJ SOAJ: 0.3000 mg | Freq: Once | INTRAMUSCULAR | Status: DC | PRN

## 2020-01-26 MED ORDER — DIPHENHYDRAMINE HCL 50 MG/ML IJ SOLN: 50.0000 mg | Freq: Once | INTRAMUSCULAR | Status: DC | PRN

## 2020-01-26 NOTE — Discharge Instructions (Signed)

## 2020-01-26 NOTE — Progress Notes (Signed)
  Diagnosis: COVID-19  Physician: Dr. Joya Gaskins   Procedure: [facility] Med: bamlanivimab infusion - Provided patient with bamlanimivab fact sheet for patients, parents and caregivers prior to infusion.  Complications: No immediate complications noted.  Discharge: Discharged home   Craig Ryan 01/26/2020

## 2020-02-01 ENCOUNTER — Encounter: Payer: Self-pay | Admitting: Nurse Practitioner

## 2020-02-04 DIAGNOSIS — M9903 Segmental and somatic dysfunction of lumbar region: Secondary | ICD-10-CM | POA: Diagnosis not present

## 2020-02-04 DIAGNOSIS — M5416 Radiculopathy, lumbar region: Secondary | ICD-10-CM | POA: Diagnosis not present

## 2020-02-04 DIAGNOSIS — M9905 Segmental and somatic dysfunction of pelvic region: Secondary | ICD-10-CM | POA: Diagnosis not present

## 2020-02-04 DIAGNOSIS — M5431 Sciatica, right side: Secondary | ICD-10-CM | POA: Diagnosis not present

## 2020-02-05 ENCOUNTER — Ambulatory Visit (INDEPENDENT_AMBULATORY_CARE_PROVIDER_SITE_OTHER): Payer: BC Managed Care – PPO | Admitting: Nurse Practitioner

## 2020-02-05 ENCOUNTER — Encounter: Payer: Self-pay | Admitting: Nurse Practitioner

## 2020-02-05 DIAGNOSIS — U071 COVID-19: Secondary | ICD-10-CM | POA: Diagnosis not present

## 2020-02-05 MED ORDER — PREDNISONE 20 MG PO TABS: 40.0000 mg | ORAL_TABLET | Freq: Every day | ORAL | 0 refills | Status: AC

## 2020-02-05 MED ORDER — ALBUTEROL SULFATE HFA 108 (90 BASE) MCG/ACT IN AERS: 2.0000 | INHALATION_SPRAY | Freq: Four times a day (QID) | RESPIRATORY_TRACT | 0 refills | Status: DC | PRN

## 2020-02-05 NOTE — Assessment & Plan Note (Signed)
Tested positive 01/21/2020 and received MAB 01/26/2020.  Overall symptoms have improved with exception of intermittent cough being present still.  Will send in script for Prednisone and Albuterol PRN.  Discussed with patient.  He is to continue to take Mucinex at night as needed for cough.  Recommend plenty of hydration and rest.  Return to work note date for 02/08/2020 sent.  Return as scheduled or for worsening/ongoing symptoms.

## 2020-02-05 NOTE — Patient Instructions (Signed)
COVID-19 COVID-19 is a respiratory infection that is caused by a virus called severe acute respiratory syndrome coronavirus 2 (SARS-CoV-2). The disease is also known as coronavirus disease or novel coronavirus. In some people, the virus may not cause any symptoms. In others, it may cause a serious infection. The infection can get worse quickly and can lead to complications, such as:  Pneumonia, or infection of the lungs.  Acute respiratory distress syndrome or ARDS. This is a condition in which fluid build-up in the lungs prevents the lungs from filling with air and passing oxygen into the blood.  Acute respiratory failure. This is a condition in which there is not enough oxygen passing from the lungs to the body or when carbon dioxide is not passing from the lungs out of the body.  Sepsis or septic shock. This is a serious bodily reaction to an infection.  Blood clotting problems.  Secondary infections due to bacteria or fungus.  Organ failure. This is when your body's organs stop working. The virus that causes COVID-19 is contagious. This means that it can spread from person to person through droplets from coughs and sneezes (respiratory secretions). What are the causes? This illness is caused by a virus. You may catch the virus by:  Breathing in droplets from an infected person. Droplets can be spread by a person breathing, speaking, singing, coughing, or sneezing.  Touching something, like a table or a doorknob, that was exposed to the virus (contaminated) and then touching your mouth, nose, or eyes. What increases the risk? Risk for infection You are more likely to be infected with this virus if you:  Are within 6 feet (2 meters) of a person with COVID-19.  Provide care for or live with a person who is infected with COVID-19.  Spend time in crowded indoor spaces or live in shared housing. Risk for serious illness You are more likely to become seriously ill from the virus if you:   Are 50 years of age or older. The higher your age, the more you are at risk for serious illness.  Live in a nursing home or long-term care facility.  Have cancer.  Have a long-term (chronic) disease such as: ? Chronic lung disease, including chronic obstructive pulmonary disease or asthma. ? A long-term disease that lowers your body's ability to fight infection (immunocompromised). ? Heart disease, including heart failure, a condition in which the arteries that lead to the heart become narrow or blocked (coronary artery disease), a disease which makes the heart muscle thick, weak, or stiff (cardiomyopathy). ? Diabetes. ? Chronic kidney disease. ? Sickle cell disease, a condition in which red blood cells have an abnormal "sickle" shape. ? Liver disease.  Are obese. What are the signs or symptoms? Symptoms of this condition can range from mild to severe. Symptoms may appear any time from 2 to 14 days after being exposed to the virus. They include:  A fever or chills.  A cough.  Difficulty breathing.  Headaches, body aches, or muscle aches.  Runny or stuffy (congested) nose.  A sore throat.  New loss of taste or smell. Some people may also have stomach problems, such as nausea, vomiting, or diarrhea. Other people may not have any symptoms of COVID-19. How is this diagnosed? This condition may be diagnosed based on:  Your signs and symptoms, especially if: ? You live in an area with a COVID-19 outbreak. ? You recently traveled to or from an area where the virus is common. ? You   provide care for or live with a person who was diagnosed with COVID-19. ? You were exposed to a person who was diagnosed with COVID-19.  A physical exam.  Lab tests, which may include: ? Taking a sample of fluid from the back of your nose and throat (nasopharyngeal fluid), your nose, or your throat using a swab. ? A sample of mucus from your lungs (sputum). ? Blood tests.  Imaging tests, which  may include, X-rays, CT scan, or ultrasound. How is this treated? At present, there is no medicine to treat COVID-19. Medicines that treat other diseases are being used on a trial basis to see if they are effective against COVID-19. Your health care provider will talk with you about ways to treat your symptoms. For most people, the infection is mild and can be managed at home with rest, fluids, and over-the-counter medicines. Treatment for a serious infection usually takes places in a hospital intensive care unit (ICU). It may include one or more of the following treatments. These treatments are given until your symptoms improve.  Receiving fluids and medicines through an IV.  Supplemental oxygen. Extra oxygen is given through a tube in the nose, a face mask, or a hood.  Positioning you to lie on your stomach (prone position). This makes it easier for oxygen to get into the lungs.  Continuous positive airway pressure (CPAP) or bi-level positive airway pressure (BPAP) machine. This treatment uses mild air pressure to keep the airways open. A tube that is connected to a motor delivers oxygen to the body.  Ventilator. This treatment moves air into and out of the lungs by using a tube that is placed in your windpipe.  Tracheostomy. This is a procedure to create a hole in the neck so that a breathing tube can be inserted.  Extracorporeal membrane oxygenation (ECMO). This procedure gives the lungs a chance to recover by taking over the functions of the heart and lungs. It supplies oxygen to the body and removes carbon dioxide. Follow these instructions at home: Lifestyle  If you are sick, stay home except to get medical care. Your health care provider will tell you how long to stay home. Call your health care provider before you go for medical care.  Rest at home as told by your health care provider.  Do not use any products that contain nicotine or tobacco, such as cigarettes, e-cigarettes, and  chewing tobacco. If you need help quitting, ask your health care provider.  Return to your normal activities as told by your health care provider. Ask your health care provider what activities are safe for you. General instructions  Take over-the-counter and prescription medicines only as told by your health care provider.  Drink enough fluid to keep your urine pale yellow.  Keep all follow-up visits as told by your health care provider. This is important. How is this prevented?  There is no vaccine to help prevent COVID-19 infection. However, there are steps you can take to protect yourself and others from this virus. To protect yourself:   Do not travel to areas where COVID-19 is a risk. The areas where COVID-19 is reported change often. To identify high-risk areas and travel restrictions, check the CDC travel website: wwwnc.cdc.gov/travel/notices  If you live in, or must travel to, an area where COVID-19 is a risk, take precautions to avoid infection. ? Stay away from people who are sick. ? Wash your hands often with soap and water for 20 seconds. If soap and water   are not available, use an alcohol-based hand sanitizer. ? Avoid touching your mouth, face, eyes, or nose. ? Avoid going out in public, follow guidance from your state and local health authorities. ? If you must go out in public, wear a cloth face covering or face mask. Make sure your mask covers your nose and mouth. ? Avoid crowded indoor spaces. Stay at least 6 feet (2 meters) away from others. ? Disinfect objects and surfaces that are frequently touched every day. This may include:  Counters and tables.  Doorknobs and light switches.  Sinks and faucets.  Electronics, such as phones, remote controls, keyboards, computers, and tablets. To protect others: If you have symptoms of COVID-19, take steps to prevent the virus from spreading to others.  If you think you have a COVID-19 infection, contact your health care  provider right away. Tell your health care team that you think you may have a COVID-19 infection.  Stay home. Leave your house only to seek medical care. Do not use public transport.  Do not travel while you are sick.  Wash your hands often with soap and water for 20 seconds. If soap and water are not available, use alcohol-based hand sanitizer.  Stay away from other members of your household. Let healthy household members care for children and pets, if possible. If you have to care for children or pets, wash your hands often and wear a mask. If possible, stay in your own room, separate from others. Use a different bathroom.  Make sure that all people in your household wash their hands well and often.  Cough or sneeze into a tissue or your sleeve or elbow. Do not cough or sneeze into your hand or into the air.  Wear a cloth face covering or face mask. Make sure your mask covers your nose and mouth. Where to find more information  Centers for Disease Control and Prevention: www.cdc.gov/coronavirus/2019-ncov/index.html  World Health Organization: www.who.int/health-topics/coronavirus Contact a health care provider if:  You live in or have traveled to an area where COVID-19 is a risk and you have symptoms of the infection.  You have had contact with someone who has COVID-19 and you have symptoms of the infection. Get help right away if:  You have trouble breathing.  You have pain or pressure in your chest.  You have confusion.  You have bluish lips and fingernails.  You have difficulty waking from sleep.  You have symptoms that get worse. These symptoms may represent a serious problem that is an emergency. Do not wait to see if the symptoms will go away. Get medical help right away. Call your local emergency services (911 in the [place] Do not drive yourself to the hospital. Let the emergency medical personnel know if you think you have COVID-19. Summary  COVID-19 is a  respiratory infection that is caused by a virus. It is also known as coronavirus disease or novel coronavirus. It can cause serious infections, such as pneumonia, acute respiratory distress syndrome, acute respiratory failure, or sepsis.  The virus that causes COVID-19 is contagious. This means that it can spread from person to person through droplets from breathing, speaking, singing, coughing, or sneezing.  You are more likely to develop a serious illness if you are 50 years of age or older, have a weak immune system, live in a nursing home, or have chronic disease.  There is no medicine to treat COVID-19. Your health care provider will talk with you about ways to treat your symptoms.    Take steps to protect yourself and others from infection. Wash your hands often and disinfect objects and surfaces that are frequently touched every day. Stay away from people who are sick and wear a mask if you are sick. This information is not intended to replace advice given to you by your health care provider. Make sure you discuss any questions you have with your health care provider. Document Revised: 08/24/2019 Document Reviewed: 11/30/2018 Elsevier Patient Education  2020 [employer]  

## 2020-02-05 NOTE — Progress Notes (Signed)
There were no vitals taken for this visit.   Subjective:    Patient ID: Craig Ryan, male    DOB: Feb 04, 1961, 59 y.o.   MRN: [id number]  HPI: Craig Ryan is a 59 y.o. male  Chief Complaint  Patient presents with  . Follow-up    requesting new letter as he was out today as well      . This visit was completed via MyChart due to the restrictions of the COVID-19 pandemic. All issues as above were discussed and addressed. Physical exam was done as above through visual confirmation on MyChart. If it was felt that the patient should be evaluated in the office, they were directed there. The patient verbally consented to this visit. . Location of the patient: home . Location of the provider: work . Those involved with this call:  . Provider: Marnee Guarneri, DNP . CMA: Yvonna Alanis, CMA . Front Desk/Registration: Don Perking  . Time spent on call: 15 minutes with patient face to face via video conference. More than 50% of this time was spent in counseling and coordination of care. 10 minutes total spent in review of patient's record and preparation of their chart.  . I verified patient identity using two factors (patient name and date of birth). Patient consents verbally to being seen via telemedicine visit today.   COVID POSITIVE: Tested positive for Covid mid-March, 01/21/2020, and received MAB treatment on 01/26/2020.  Was scheduled to go back to work today, but had a bad night last night and could not sleep due to cough.  Reports his sciatica also started to act up last night as well and he did not rest Fever: no Cough: yes, worse at night, but better with Mucinex Shortness of breath: no Wheezing: no Chest pain: no Chest tightness: none Chest congestion: no Nasal congestion: no Runny nose: no Post nasal drip: no Sneezing: no Sore throat: no Swollen glands: no Sinus pressure: no Headache: mild this morning Face pain: no Toothache: no Ear pain: none Ear  pressure: none Eyes red/itching:no Eye drainage/crusting: no  Vomiting: no Rash: no Fatigue: no Sick contacts: no Strep contacts: no  Context: stable  Relevant past medical, surgical, family and social history reviewed and updated as indicated. Interim medical history since our last visit reviewed. Allergies and medications reviewed and updated.  Review of Systems  Constitutional: Negative for activity change, chills, diaphoresis, fatigue and fever.  HENT: Negative.   Respiratory: Positive for cough. Negative for chest tightness, shortness of breath and wheezing.   Cardiovascular: Negative for chest pain, palpitations and leg swelling.  Gastrointestinal: Negative.   Neurological: Negative.   Psychiatric/Behavioral: Negative.     Per HPI unless specifically indicated above     Objective:    There were no vitals taken for this visit.  Wt Readings from Last 3 Encounters:  05/28/19 209 lb (94.8 kg)  04/26/18 210 lb (95.3 kg)  02/03/18 210 lb 12.8 oz (95.6 kg)    Physical Exam Vitals and nursing note reviewed.  Constitutional:      General: He is awake. He is not in acute distress.    Appearance: He is well-developed. He is not ill-appearing.  HENT:     Head: Normocephalic.     Right Ear: Hearing normal. No drainage.     Left Ear: Hearing normal. No drainage.  Eyes:     General: Lids are normal.        Right eye: No discharge.  Left eye: No discharge.     Conjunctiva/sclera: Conjunctivae normal.  Pulmonary:     Effort: Pulmonary effort is normal. No accessory muscle usage or respiratory distress.  Musculoskeletal:     Cervical back: Normal range of motion.  Neurological:     Mental Status: He is alert and oriented to person, place, and time.  Psychiatric:        Mood and Affect: Mood normal.        Behavior: Behavior normal. Behavior is cooperative.        Thought Content: Thought content normal.        Judgment: Judgment normal.     Results for orders  placed or performed in visit on [id number]  Basic Metabolic Panel (BMET)  Result Value Ref Range   Glucose 93 65 - 99 mg/dL   BUN 15 6 - 24 mg/dL   Creatinine, Ser 1.06 0.76 - 1.27 mg/dL   GFR calc non Af Amer 77 >59 mL/min/1.73   GFR calc Af Amer 89 >59 mL/min/1.73   BUN/Creatinine Ratio 14 9 - 20   Sodium 144 134 - 144 mmol/L   Potassium 4.2 3.5 - 5.2 mmol/L   Chloride 106 96 - 106 mmol/L   CO2 24 20 - 29 mmol/L   Calcium 9.2 8.7 - 10.2 mg/dL  Lipid Panel Piccolo, Waived  Result Value Ref Range   Cholesterol Piccolo, Waived 151 <200 mg/dL   HDL Chol Piccolo, Waived 56 (L) >59 mg/dL   Triglycerides Piccolo,Waived 128 <150 mg/dL   Chol/HDL Ratio Piccolo,Waive 2.7 mg/dL   LDL Chol Calc Piccolo Waived 69 <100 mg/dL   VLDL Chol Calc Piccolo,Waive 26 <30 mg/dL  Microalbumin, Urine Waived  Result Value Ref Range   Microalb, Ur Waived 30 (H) 0 - 19 mg/L   Creatinine, Urine Waived 200 10 - 300 mg/dL   Microalb/Creat Ratio <30 <30 mg/g  Bayer DCA Hb A1c Waived  Result Value Ref Range   HB A1C (BAYER DCA - WAIVED) 5.7 <7.0 %      Assessment & Plan:   Problem List Items Addressed This Visit      Other   Lab test positive for detection of COVID-19 virus    Tested positive 01/21/2020 and received MAB 01/26/2020.  Overall symptoms have improved with exception of intermittent cough being present still.  Will send in script for Prednisone and Albuterol PRN.  Discussed with patient.  He is to continue to take Mucinex at night as needed for cough.  Recommend plenty of hydration and rest.  Return to work note date for 02/08/2020 sent.  Return as scheduled or for worsening/ongoing symptoms.         I discussed the assessment and treatment plan with the patient. The patient was provided an opportunity to ask questions and all were answered. The patient agreed with the plan and demonstrated an understanding of the instructions.   The patient was advised to call back or seek an in-person evaluation  if the symptoms worsen or if the condition fails to improve as anticipated.   I provided 15+ minutes of time during this encounter.  Follow up plan: Return if symptoms worsen or fail to improve.

## 2020-02-06 DIAGNOSIS — M5416 Radiculopathy, lumbar region: Secondary | ICD-10-CM | POA: Diagnosis not present

## 2020-02-06 DIAGNOSIS — M9903 Segmental and somatic dysfunction of lumbar region: Secondary | ICD-10-CM | POA: Diagnosis not present

## 2020-02-06 DIAGNOSIS — M5431 Sciatica, right side: Secondary | ICD-10-CM | POA: Diagnosis not present

## 2020-02-06 DIAGNOSIS — M9905 Segmental and somatic dysfunction of pelvic region: Secondary | ICD-10-CM | POA: Diagnosis not present

## 2020-02-18 ENCOUNTER — Encounter: Payer: Self-pay | Admitting: Nurse Practitioner

## 2020-02-18 DIAGNOSIS — M5431 Sciatica, right side: Secondary | ICD-10-CM | POA: Diagnosis not present

## 2020-02-18 DIAGNOSIS — M9905 Segmental and somatic dysfunction of pelvic region: Secondary | ICD-10-CM | POA: Diagnosis not present

## 2020-02-18 DIAGNOSIS — M5416 Radiculopathy, lumbar region: Secondary | ICD-10-CM | POA: Diagnosis not present

## 2020-02-18 DIAGNOSIS — M9903 Segmental and somatic dysfunction of lumbar region: Secondary | ICD-10-CM | POA: Diagnosis not present

## 2020-02-19 NOTE — Telephone Encounter (Signed)
Paperwork started and place in your folder

## 2020-03-11 DIAGNOSIS — L821 Other seborrheic keratosis: Secondary | ICD-10-CM | POA: Diagnosis not present

## 2020-03-11 DIAGNOSIS — C4442 Squamous cell carcinoma of skin of scalp and neck: Secondary | ICD-10-CM | POA: Diagnosis not present

## 2020-03-11 DIAGNOSIS — D2272 Melanocytic nevi of left lower limb, including hip: Secondary | ICD-10-CM | POA: Diagnosis not present

## 2020-03-11 DIAGNOSIS — L570 Actinic keratosis: Secondary | ICD-10-CM | POA: Diagnosis not present

## 2020-03-11 DIAGNOSIS — C44519 Basal cell carcinoma of skin of other part of trunk: Secondary | ICD-10-CM | POA: Diagnosis not present

## 2020-03-11 DIAGNOSIS — D044 Carcinoma in situ of skin of scalp and neck: Secondary | ICD-10-CM | POA: Diagnosis not present

## 2020-03-11 DIAGNOSIS — D2262 Melanocytic nevi of left upper limb, including shoulder: Secondary | ICD-10-CM | POA: Diagnosis not present

## 2020-03-11 DIAGNOSIS — D485 Neoplasm of uncertain behavior of skin: Secondary | ICD-10-CM | POA: Diagnosis not present

## 2020-03-11 DIAGNOSIS — D2261 Melanocytic nevi of right upper limb, including shoulder: Secondary | ICD-10-CM | POA: Diagnosis not present

## 2020-03-11 DIAGNOSIS — D225 Melanocytic nevi of trunk: Secondary | ICD-10-CM | POA: Diagnosis not present

## 2020-03-17 DIAGNOSIS — M9903 Segmental and somatic dysfunction of lumbar region: Secondary | ICD-10-CM | POA: Diagnosis not present

## 2020-03-17 DIAGNOSIS — M9905 Segmental and somatic dysfunction of pelvic region: Secondary | ICD-10-CM | POA: Diagnosis not present

## 2020-03-17 DIAGNOSIS — M5431 Sciatica, right side: Secondary | ICD-10-CM | POA: Diagnosis not present

## 2020-03-17 DIAGNOSIS — M5416 Radiculopathy, lumbar region: Secondary | ICD-10-CM | POA: Diagnosis not present

## 2020-04-11 DIAGNOSIS — D044 Carcinoma in situ of skin of scalp and neck: Secondary | ICD-10-CM | POA: Diagnosis not present

## 2020-04-11 DIAGNOSIS — D049 Carcinoma in situ of skin, unspecified: Secondary | ICD-10-CM | POA: Diagnosis not present

## 2020-04-14 DIAGNOSIS — M9905 Segmental and somatic dysfunction of pelvic region: Secondary | ICD-10-CM | POA: Diagnosis not present

## 2020-04-14 DIAGNOSIS — M5416 Radiculopathy, lumbar region: Secondary | ICD-10-CM | POA: Diagnosis not present

## 2020-04-14 DIAGNOSIS — M9903 Segmental and somatic dysfunction of lumbar region: Secondary | ICD-10-CM | POA: Diagnosis not present

## 2020-04-14 DIAGNOSIS — M5431 Sciatica, right side: Secondary | ICD-10-CM | POA: Diagnosis not present

## 2020-04-21 ENCOUNTER — Other Ambulatory Visit: Payer: Self-pay

## 2020-04-21 DIAGNOSIS — I10 Essential (primary) hypertension: Secondary | ICD-10-CM

## 2020-04-21 MED ORDER — METOPROLOL SUCCINATE ER 50 MG PO TB24: 50.0000 mg | ORAL_TABLET | Freq: Every day | ORAL | 4 refills | Status: DC

## 2020-04-21 MED ORDER — LISINOPRIL 40 MG PO TABS: 40.0000 mg | ORAL_TABLET | Freq: Every day | ORAL | 4 refills | Status: DC

## 2020-04-25 DIAGNOSIS — C44519 Basal cell carcinoma of skin of other part of trunk: Secondary | ICD-10-CM | POA: Diagnosis not present

## 2020-06-03 ENCOUNTER — Encounter: Payer: BC Managed Care – PPO | Admitting: Nurse Practitioner

## 2020-06-17 ENCOUNTER — Encounter: Payer: BC Managed Care – PPO | Admitting: Nurse Practitioner

## 2020-08-04 ENCOUNTER — Encounter: Payer: BC Managed Care – PPO | Admitting: Nurse Practitioner

## 2020-08-14 DIAGNOSIS — Z83518 Family history of other specified eye disorder: Secondary | ICD-10-CM | POA: Diagnosis not present

## 2020-08-14 DIAGNOSIS — H35073 Retinal telangiectasis, bilateral: Secondary | ICD-10-CM | POA: Diagnosis not present

## 2020-08-14 DIAGNOSIS — H2513 Age-related nuclear cataract, bilateral: Secondary | ICD-10-CM | POA: Diagnosis not present

## 2020-08-18 DIAGNOSIS — M9905 Segmental and somatic dysfunction of pelvic region: Secondary | ICD-10-CM | POA: Diagnosis not present

## 2020-08-18 DIAGNOSIS — M5431 Sciatica, right side: Secondary | ICD-10-CM | POA: Diagnosis not present

## 2020-08-18 DIAGNOSIS — M5416 Radiculopathy, lumbar region: Secondary | ICD-10-CM | POA: Diagnosis not present

## 2020-08-18 DIAGNOSIS — M9903 Segmental and somatic dysfunction of lumbar region: Secondary | ICD-10-CM | POA: Diagnosis not present

## 2020-08-28 ENCOUNTER — Other Ambulatory Visit: Payer: Self-pay

## 2020-08-28 ENCOUNTER — Ambulatory Visit (INDEPENDENT_AMBULATORY_CARE_PROVIDER_SITE_OTHER): Payer: BC Managed Care – PPO | Admitting: Nurse Practitioner

## 2020-08-28 ENCOUNTER — Encounter: Payer: Self-pay | Admitting: Nurse Practitioner

## 2020-08-28 VITALS — BP 128/80 | HR 80 | Temp 98.5°F | Resp 16 | Ht 67.5 in | Wt 215.8 lb

## 2020-08-28 DIAGNOSIS — Z0000 Encounter for general adult medical examination without abnormal findings: Secondary | ICD-10-CM

## 2020-08-28 DIAGNOSIS — I10 Essential (primary) hypertension: Secondary | ICD-10-CM | POA: Diagnosis not present

## 2020-08-28 DIAGNOSIS — E782 Mixed hyperlipidemia: Secondary | ICD-10-CM | POA: Diagnosis not present

## 2020-08-28 DIAGNOSIS — Z23 Encounter for immunization: Secondary | ICD-10-CM

## 2020-08-28 DIAGNOSIS — Z131 Encounter for screening for diabetes mellitus: Secondary | ICD-10-CM

## 2020-08-28 MED ORDER — LISINOPRIL 40 MG PO TABS: 40.0000 mg | ORAL_TABLET | Freq: Every day | ORAL | 4 refills | Status: DC

## 2020-08-28 MED ORDER — AMLODIPINE BESYLATE 5 MG PO TABS: 5.0000 mg | ORAL_TABLET | Freq: Every day | ORAL | 4 refills | Status: DC

## 2020-08-28 MED ORDER — METOPROLOL SUCCINATE ER 50 MG PO TB24: 50.0000 mg | ORAL_TABLET | Freq: Every day | ORAL | 0 refills | Status: DC

## 2020-08-28 MED ORDER — SHINGRIX 50 MCG/0.5ML IM SUSR: 0.5000 mL | Freq: Once | INTRAMUSCULAR | 0 refills | Status: AC

## 2020-08-28 MED ORDER — ATORVASTATIN CALCIUM 40 MG PO TABS: ORAL_TABLET | ORAL | 0 refills | Status: DC

## 2020-08-28 MED ORDER — SHINGRIX 50 MCG/0.5ML IM SUSR: 0.5000 mL | Freq: Once | INTRAMUSCULAR | 0 refills | Status: DC

## 2020-08-28 MED ORDER — AMLODIPINE BESYLATE 5 MG PO TABS: 5.0000 mg | ORAL_TABLET | Freq: Every day | ORAL | 0 refills | Status: DC

## 2020-08-28 MED ORDER — LISINOPRIL 40 MG PO TABS: 40.0000 mg | ORAL_TABLET | Freq: Every day | ORAL | 0 refills | Status: DC

## 2020-08-28 MED ORDER — METOPROLOL SUCCINATE ER 50 MG PO TB24: 50.0000 mg | ORAL_TABLET | Freq: Every day | ORAL | 4 refills | Status: DC

## 2020-08-28 MED ORDER — ATORVASTATIN CALCIUM 40 MG PO TABS: ORAL_TABLET | ORAL | 4 refills | Status: DC

## 2020-08-28 NOTE — Patient Instructions (Signed)
Healthy Eating Following a healthy eating pattern may help you to achieve and maintain a healthy body weight, reduce the risk of chronic disease, and live a long and productive life. It is important to follow a healthy eating pattern at an appropriate calorie level for your body. Your nutritional needs should be met primarily through food by choosing a variety of nutrient-rich foods. What are tips for following this plan? Reading food labels  Read labels and choose the following: ? Reduced or low sodium. ? Juices with 100% fruit juice. ? Foods with low saturated fats and high polyunsaturated and monounsaturated fats. ? Foods with whole grains, such as whole wheat, cracked wheat, brown rice, and wild rice. ? Whole grains that are fortified with folic acid. This is recommended for women who are pregnant or who want to become pregnant.  Read labels and avoid the following: ? Foods with a lot of added sugars. These include foods that contain brown sugar, corn sweetener, corn syrup, dextrose, fructose, glucose, high-fructose corn syrup, honey, invert sugar, lactose, malt syrup, maltose, molasses, raw sugar, sucrose, trehalose, or turbinado sugar.  Do not eat more than the following amounts of added sugar per day:  6 teaspoons (25 g) for women.  9 teaspoons (38 g) for men. ? Foods that contain processed or refined starches and grains. ? Refined grain products, such as white flour, degermed cornmeal, white bread, and white rice. Shopping  Choose nutrient-rich snacks, such as vegetables, whole fruits, and nuts. Avoid high-calorie and high-sugar snacks, such as potato chips, fruit snacks, and candy.  Use oil-based dressings and spreads on foods instead of solid fats such as butter, stick margarine, or cream cheese.  Limit pre-made sauces, mixes, and "instant" products such as flavored rice, instant noodles, and ready-made pasta.  Try more plant-protein sources, such as tofu, tempeh, black beans,  edamame, lentils, nuts, and seeds.  Explore eating plans such as the Mediterranean diet or vegetarian diet. Cooking  Use oil to saut or stir-fry foods instead of solid fats such as butter, stick margarine, or lard.  Try baking, boiling, grilling, or broiling instead of frying.  Remove the fatty part of meats before cooking.  Steam vegetables in water or broth. Meal planning   At meals, imagine dividing your plate into fourths: ? One-half of your plate is fruits and vegetables. ? One-fourth of your plate is whole grains. ? One-fourth of your plate is protein, especially lean meats, poultry, eggs, tofu, beans, or nuts.  Include low-fat dairy as part of your daily diet. Lifestyle  Choose healthy options in all settings, including home, work, school, restaurants, or stores.  Prepare your food safely: ? Wash your hands after handling raw meats. ? Keep food preparation surfaces clean by regularly washing with hot, soapy water. ? Keep raw meats separate from ready-to-eat foods, such as fruits and vegetables. ? Cook seafood, meat, poultry, and eggs to the recommended internal temperature. ? Store foods at safe temperatures. In general:  Keep cold foods at 59F (4.4C) or below.  Keep hot foods at 159F (60C) or above.  Keep your freezer at [facility] (-17.8C) or below.  Foods are no longer safe to eat when they have been between the temperatures of 40-159F (4.4-60C) for more than 2 hours. What foods should I eat? Fruits Aim to eat 2 cup-equivalents of fresh, canned (in natural juice), or frozen fruits each day. Examples of 1 cup-equivalent of fruit include 1 small apple, 8 large strawberries, 1 cup canned fruit,  cup  dried fruit, or 1 cup 100% juice. Vegetables Aim to eat 2-3 cup-equivalents of fresh and frozen vegetables each day, including different varieties and colors. Examples of 1 cup-equivalent of vegetables include 2 medium carrots, 2 cups raw, leafy greens, 1 cup chopped  vegetable (raw or cooked), or 1 medium baked potato. Grains Aim to eat 6 ounce-equivalents of whole grains each day. Examples of 1 ounce-equivalent of grains include 1 slice of bread, 1 cup ready-to-eat cereal, 3 cups popcorn, or  cup cooked rice, pasta, or cereal. Meats and other proteins Aim to eat 5-6 ounce-equivalents of protein each day. Examples of 1 ounce-equivalent of protein include 1 egg, 1/2 cup nuts or seeds, or 1 tablespoon (16 g) peanut butter. A cut of meat or fish that is the size of a deck of cards is about 3-4 ounce-equivalents.  Of the protein you eat each week, try to have at least 8 ounces come from seafood. This includes salmon, trout, herring, and anchovies. Dairy Aim to eat 3 cup-equivalents of fat-free or low-fat dairy each day. Examples of 1 cup-equivalent of dairy include 1 cup (240 mL) milk, 8 ounces (250 g) yogurt, 1 ounces (44 g) natural cheese, or 1 cup (240 mL) fortified soy milk. Fats and oils  Aim for about 5 teaspoons (21 g) per day. Choose monounsaturated fats, such as canola and olive oils, avocados, peanut butter, and most nuts, or polyunsaturated fats, such as sunflower, corn, and soybean oils, walnuts, pine nuts, sesame seeds, sunflower seeds, and flaxseed. Beverages  Aim for six 8-oz glasses of water per day. Limit coffee to three to five 8-oz cups per day.  Limit caffeinated beverages that have added calories, such as soda and energy drinks.  Limit alcohol intake to no more than 1 drink a day for nonpregnant women and 2 drinks a day for men. One drink equals 12 oz of beer (355 mL), 5 oz of wine (148 mL), or 1 oz of hard liquor (44 mL). Seasoning and other foods  Avoid adding excess amounts of salt to your foods. Try flavoring foods with herbs and spices instead of salt.  Avoid adding sugar to foods.  Try using oil-based dressings, sauces, and spreads instead of solid fats. This information is based on general [place] nutrition guidelines. For more  information, visit [employer]. Exact amounts may vary based on your nutrition needs. Summary  A healthy eating plan may help you to maintain a healthy weight, reduce the risk of chronic diseases, and stay active throughout your life.  Plan your meals. Make sure you eat the right portions of a variety of nutrient-rich foods.  Try baking, boiling, grilling, or broiling instead of frying.  Choose healthy options in all settings, including home, work, school, restaurants, or stores. This information is not intended to replace advice given to you by your health care provider. Make sure you discuss any questions you have with your health care provider. Document Revised: 02/06/2018 Document Reviewed: 02/06/2018 Elsevier Patient Education  [place].

## 2020-08-28 NOTE — Progress Notes (Signed)
BP 128/80 (BP Location: Left Arm)   Pulse 80   Temp 98.5 F (36.9 C) (Oral)   Resp 16   Ht 5' 7.5" (1.715 m)   Wt 215 lb 12.8 oz (97.9 kg)   SpO2 98%   BMI 33.30 kg/m    Subjective:    Patient ID: Craig Ryan, male    DOB: 20-Oct-1961, 59 y.o.   MRN: [id number]  HPI: Craig Ryan is a 59 y.o. male presenting on 08/28/2020 for comprehensive medical examination. Current medical complaints include:none  He currently lives with: wife Interim Problems from his last visit: no   Functional Status Survey: Is the patient deaf or have difficulty hearing?: No Does the patient have difficulty seeing, even when wearing glasses/contacts?: No Does the patient have difficulty concentrating, remembering, or making decisions?: No Does the patient have difficulty walking or climbing stairs?: No Does the patient have difficulty dressing or bathing?: No Does the patient have difficulty doing errands alone such as visiting a doctor's office or shopping?: No  FALL RISK: Fall Risk  08/28/2020 05/28/2019 04/26/2018 10/11/2017 04/06/2017  Falls in the past year? 0 0 No No No  Number falls in past yr: 0 0 - - -  Injury with Fall? 0 0 - - -  Risk for fall due to : No Fall Risks - - - -  Follow up Falls evaluation completed Falls evaluation completed - - -    Depression Screen Depression screen [facility] 2/9 08/28/2020 05/28/2019 04/26/2018 10/11/2017 04/06/2017  Decreased Interest 0 0 0 0 0  Down, Depressed, Hopeless 0 0 0 0 0  PHQ - 2 Score 0 0 0 0 0  Altered sleeping - 1 - - -  Tired, decreased energy - 0 - - -  Change in appetite - 1 - - -  Feeling bad or failure about yourself  - 0 - - -  Trouble concentrating - 1 - - -  Moving slowly or fidgety/restless - 0 - - -  Suicidal thoughts - 0 - - -  PHQ-9 Score - 3 - - -  Difficult doing work/chores - Not difficult at all - - -    Advanced Directives <no information>  Past Medical History:  Past Medical History:  Diagnosis Date  . Allergy    . History of shingles   . Hyperlipidemia   . Hypertension   . Nephrolithiasis   . OSA on CPAP     Surgical History:  Past Surgical History:  Procedure Laterality Date  . COLONOSCOPY    . EXTRACORPOREAL SHOCK WAVE LITHOTRIPSY Left 10/16/2015   Procedure: EXTRACORPOREAL SHOCK WAVE LITHOTRIPSY (ESWL);  Surgeon: Royston Cowper, MD;  Location: [facility];  Service: Urology;  Laterality: Left;  . EXTRACORPOREAL SHOCK WAVE LITHOTRIPSY Left 10/30/2015   Procedure: EXTRACORPOREAL SHOCK WAVE LITHOTRIPSY (ESWL);  Surgeon: Royston Cowper, MD;  Location: [facility];  Service: Urology;  Laterality: Left;  . EXTRACORPOREAL SHOCK WAVE LITHOTRIPSY Left 09/20/2019   Procedure: EXTRACORPOREAL SHOCK WAVE LITHOTRIPSY (ESWL);  Surgeon: Royston Cowper, MD;  Location: [facility];  Service: Urology;  Laterality: Left;  . LITHOTRIPSY  Feb 2016, 2009    Medications:  Current Outpatient Medications on File Prior to Visit  Medication Sig  . aspirin EC 81 MG tablet Take 81 mg by mouth daily.  . Cetirizine HCl (ZYRTEC PO) Take by mouth daily.  . Fish Oil OIL 1 capsule by Does not apply route daily.  . Multiple Vitamin (MULTIVITAMIN) capsule Take 1  capsule by mouth daily.  Marland Kitchen pyridoxine (B-6) 100 MG tablet Take 100 mg by mouth daily.  . tamsulosin (FLOMAX) 0.4 MG CAPS capsule Take 0.4 mg by mouth daily.  Marland Kitchen UNABLE TO FIND as needed. Fluticasone, nasal spray. 2 times daily   . VITAMIN D PO Take by mouth 2 (two) times daily.   No current facility-administered medications on file prior to visit.    Allergies:  No Known Allergies  Social History:  Social History   Socioeconomic History  . Marital status: Married    Spouse name: Not on file  . Number of children: Not on file  . Years of education: Not on file  . Highest education level: Not on file  Occupational History  . Not on file  Tobacco Use  . Smoking status: Never Smoker  . Smokeless tobacco: Never Used  Vaping Use  . Vaping Use: Never used   Substance and Sexual Activity  . Alcohol use: No  . Drug use: No  . Sexual activity: Not on file  Other Topics Concern  . Not on file  Social History Narrative  . Not on file   Social Determinants of Health   Financial Resource Strain:   . Difficulty of Paying Living Expenses: Not on file  Food Insecurity:   . Worried About Charity fundraiser in the Last Year: Not on file  . Ran Out of Food in the Last Year: Not on file  Transportation Needs:   . Lack of Transportation (Medical): Not on file  . Lack of Transportation (Non-Medical): Not on file  Physical Activity:   . Days of Exercise per Week: Not on file  . Minutes of Exercise per Session: Not on file  Stress:   . Feeling of Stress : Not on file  Social Connections:   . Frequency of Communication with Friends and Family: Not on file  . Frequency of Social Gatherings with Friends and Family: Not on file  . Attends Religious Services: Not on file  . Active Member of Clubs or Organizations: Not on file  . Attends Archivist Meetings: Not on file  . Marital Status: Not on file  Intimate Partner Violence:   . Fear of Current or Ex-Partner: Not on file  . Emotionally Abused: Not on file  . Physically Abused: Not on file  . Sexually Abused: Not on file   Social History   Tobacco Use  Smoking Status Never Smoker  Smokeless Tobacco Never Used   Social History   Substance and Sexual Activity  Alcohol Use No    Family History:  Family History  Problem Relation Age of Onset  . Heart disease Mother   . Hyperlipidemia Mother   . Lung disease Mother   . Heart disease Father   . Hyperlipidemia Father   . Heart disease Brother   . Diabetes Brother     Past medical history, surgical history, medications, allergies, family history and social history reviewed with patient today and changes made to appropriate areas of the chart.   Review of Systems - negative All other ROS negative except what is listed above  and in the HPI.      Objective:    BP 128/80 (BP Location: Left Arm)   Pulse 80   Temp 98.5 F (36.9 C) (Oral)   Resp 16   Ht 5' 7.5" (1.715 m)   Wt 215 lb 12.8 oz (97.9 kg)   SpO2 98%   BMI 33.30 kg/m  Wt Readings from Last 3 Encounters:  08/28/20 215 lb 12.8 oz (97.9 kg)  05/28/19 209 lb (94.8 kg)  04/26/18 210 lb (95.3 kg)    Physical Exam Vitals and nursing note reviewed.  Constitutional:      General: He is awake. He is not in acute distress.    Appearance: He is well-developed and well-groomed. He is obese. He is not ill-appearing.  HENT:     Head: Normocephalic and atraumatic.     Right Ear: Hearing, tympanic membrane, ear canal and external ear normal. No drainage.     Left Ear: Hearing, tympanic membrane, ear canal and external ear normal. No drainage.     Nose: Nose normal.     Mouth/Throat:     Pharynx: Uvula midline.  Eyes:     General: Lids are normal.        Right eye: No discharge.        Left eye: No discharge.     Extraocular Movements: Extraocular movements intact.     Conjunctiva/sclera: Conjunctivae normal.     Pupils: Pupils are equal, round, and reactive to light.     Visual Fields: Right eye visual fields normal and left eye visual fields normal.  Neck:     Thyroid: No thyromegaly.     Vascular: No carotid bruit or JVD.     Trachea: Trachea normal.  Cardiovascular:     Rate and Rhythm: Normal rate and regular rhythm.     Heart sounds: Normal heart sounds, S1 normal and S2 normal. No murmur heard.  No gallop.   Pulmonary:     Effort: Pulmonary effort is normal. No accessory muscle usage or respiratory distress.     Breath sounds: Normal breath sounds.  Abdominal:     General: Bowel sounds are normal.     Palpations: Abdomen is soft. There is no hepatomegaly or splenomegaly.     Tenderness: There is no abdominal tenderness.  Musculoskeletal:        General: Normal range of motion.     Cervical back: Normal range of motion and neck supple.      Right lower leg: No edema.     Left lower leg: No edema.  Lymphadenopathy:     Head:     Right side of head: No submental, submandibular, tonsillar, preauricular or posterior auricular adenopathy.     Left side of head: No submental, submandibular, tonsillar, preauricular or posterior auricular adenopathy.     Cervical: No cervical adenopathy.  Skin:    General: Skin is warm and dry.     Capillary Refill: Capillary refill takes less than 2 seconds.     Findings: No rash.  Neurological:     Mental Status: He is alert and oriented to person, place, and time.     Cranial Nerves: Cranial nerves are intact.     Gait: Gait is intact.     Deep Tendon Reflexes: Reflexes are normal and symmetric.     Reflex Scores:      Brachioradialis reflexes are 2+ on the right side and 2+ on the left side.      Patellar reflexes are 2+ on the right side and 2+ on the left side. Psychiatric:        Attention and Perception: Attention normal.        Mood and Affect: Mood normal.        Speech: Speech normal.        Behavior: Behavior normal. Behavior is cooperative.  Thought Content: Thought content normal.        Cognition and Memory: Cognition normal.        Judgment: Judgment normal.     Results for orders placed or performed in visit on 13/24/40  Basic Metabolic Panel (BMET)  Result Value Ref Range   Glucose 93 65 - 99 mg/dL   BUN 15 6 - 24 mg/dL   Creatinine, Ser 1.06 0.76 - 1.27 mg/dL   GFR calc non Af Amer 77 >59 mL/min/1.73   GFR calc Af Amer 89 >59 mL/min/1.73   BUN/Creatinine Ratio 14 9 - 20   Sodium 144 134 - 144 mmol/L   Potassium 4.2 3.5 - 5.2 mmol/L   Chloride 106 96 - 106 mmol/L   CO2 24 20 - 29 mmol/L   Calcium 9.2 8.7 - 10.2 mg/dL  Lipid Panel Piccolo, Waived  Result Value Ref Range   Cholesterol Piccolo, Waived 151 <200 mg/dL   HDL Chol Piccolo, Waived 56 (L) >59 mg/dL   Triglycerides Piccolo,Waived 128 <150 mg/dL   Chol/HDL Ratio Piccolo,Waive 2.7 mg/dL   LDL  Chol Calc Piccolo Waived 69 <100 mg/dL   VLDL Chol Calc Piccolo,Waive 26 <30 mg/dL  Microalbumin, Urine Waived  Result Value Ref Range   Microalb, Ur Waived 30 (H) 0 - 19 mg/L   Creatinine, Urine Waived 200 10 - 300 mg/dL   Microalb/Creat Ratio <30 <30 mg/g  Bayer DCA Hb A1c Waived  Result Value Ref Range   HB A1C (BAYER DCA - WAIVED) 5.7 <7.0 %      Assessment & Plan:   Problem List Items Addressed This Visit      Other   Hyperlipidemia   Relevant Medications   atorvastatin (LIPITOR) 40 MG tablet   lisinopril (ZESTRIL) 40 MG tablet   amLODipine (NORVASC) 5 MG tablet   metoprolol succinate (TOPROL-XL) 50 MG 24 hr tablet   Other Relevant Orders   Lipid Panel w/o Chol/HDL Ratio    Other Visit Diagnoses    PE (physical exam), annual    -  Primary   Annual labs today, healthy adult male on exam.  CBC, CMP, lipid, PSA, TSH   Relevant Orders   CBC with Differential/Platelet   Comprehensive metabolic panel   Lipid Panel w/o Chol/HDL Ratio   TSH   PSA   Essential hypertension       Refills sent in   Relevant Medications   atorvastatin (LIPITOR) 40 MG tablet   lisinopril (ZESTRIL) 40 MG tablet   amLODipine (NORVASC) 5 MG tablet   metoprolol succinate (TOPROL-XL) 50 MG 24 hr tablet   Other Relevant Orders   CBC with Differential/Platelet   Comprehensive metabolic panel   TSH   Diabetes mellitus screening       A1C on labs today   Relevant Orders   HgB A1c   Need for influenza vaccination       Relevant Orders   Flu Vaccine QUAD 36+ mos IM (Completed)      Discussed aspirin prophylaxis for myocardial infarction prevention and decision was made to continue ASA  LABORATORY TESTING:  Health maintenance labs ordered today as discussed above.   The natural history of prostate cancer and ongoing controversy regarding screening and potential treatment outcomes of prostate cancer has been discussed with the patient. The meaning of a false positive PSA and a false negative PSA  has been discussed. He indicates understanding of the limitations of this screening test and wishes to proceed with screening PSA  testing.   IMMUNIZATIONS:   - Tdap: Tetanus vaccination status reviewed: last tetanus booster within 10 years. - Influenza: Up to date - Pneumovax: Not applicable - Prevnar: Not applicable - Zostavax vaccine: ordered  SCREENING: - Colonoscopy: Up to date  Discussed with patient purpose of the colonoscopy is to detect colon cancer at curable precancerous or early stages   - AAA Screening: Not applicable  -Hearing Test: Not applicable  -Spirometry: Not applicable   PATIENT COUNSELING:    Sexuality: Discussed sexually transmitted diseases, partner selection, use of condoms, avoidance of unintended pregnancy  and contraceptive alternatives.   Advised to avoid cigarette smoking.  I discussed with the patient that most people either abstain from alcohol or drink within safe limits (<=14/week and <=4 drinks/occasion for males, <=7/weeks and <= 3 drinks/occasion for females) and that the risk for alcohol disorders and other health effects rises proportionally with the number of drinks per week and how often a drinker exceeds daily limits.  Discussed cessation/primary prevention of drug use and availability of treatment for abuse.   Diet: Encouraged to adjust caloric intake to maintain  or achieve ideal body weight, to reduce intake of dietary saturated fat and total fat, to limit sodium intake by avoiding high sodium foods and not adding table salt, and to maintain adequate dietary potassium and calcium preferably from fresh fruits, vegetables, and low-fat dairy products.    stressed the importance of regular exercise  Injury prevention: Discussed safety belts, safety helmets, smoke detector, smoking near bedding or upholstery.   Dental health: Discussed importance of regular tooth brushing, flossing, and dental visits.   Follow up plan: NEXT PREVENTATIVE  PHYSICAL DUE IN 1 YEAR. Return in about 6 months (around 02/26/2021) for PREDIABETES, HTN/HLD, OSA -- to meet new PCP.

## 2020-08-29 LAB — COMPREHENSIVE METABOLIC PANEL
ALT: 39 IU/L (ref 0–44)
AST: 26 IU/L (ref 0–40)
Albumin/Globulin Ratio: 1.5 (ref 1.2–2.2)
Albumin: 4.3 g/dL (ref 3.8–4.9)
Alkaline Phosphatase: 93 IU/L (ref 44–121)
BUN/Creatinine Ratio: 15 (ref 9–20)
BUN: 16 mg/dL (ref 6–24)
Bilirubin Total: 0.4 mg/dL (ref 0.0–1.2)
CO2: 22 mmol/L (ref 20–29)
Calcium: 9.3 mg/dL (ref 8.7–10.2)
Chloride: 105 mmol/L (ref 96–106)
Creatinine, Ser: 1.08 mg/dL (ref 0.76–1.27)
GFR calc Af Amer: 86 mL/min/{1.73_m2} (ref >59)
GFR calc non Af Amer: 75 mL/min/{1.73_m2} (ref >59)
Globulin, Total: 2.9 g/dL (ref 1.5–4.5)
Glucose: 105 mg/dL — ABNORMAL HIGH (ref 65–99)
Potassium: 4.1 mmol/L (ref 3.5–5.2)
Sodium: 144 mmol/L (ref 134–144)
Total Protein: 7.2 g/dL (ref 6.0–8.5)

## 2020-08-29 LAB — CBC WITH DIFFERENTIAL/PLATELET
Basophils Absolute: 0.1 10*3/uL (ref 0.0–0.2)
Basos: 1 %
EOS (ABSOLUTE): 0.1 10*3/uL (ref 0.0–0.4)
Eos: 2 %
Hematocrit: 48 % (ref 37.5–51.0)
Hemoglobin: 16.8 g/dL (ref 13.0–17.7)
Immature Grans (Abs): 0 10*3/uL (ref 0.0–0.1)
Immature Granulocytes: 1 %
Lymphocytes Absolute: 1.8 10*3/uL (ref 0.7–3.1)
Lymphs: 26 %
MCH: 31.3 pg (ref 26.6–33.0)
MCHC: 35 g/dL (ref 31.5–35.7)
MCV: 89 fL (ref 79–97)
Monocytes Absolute: 0.6 10*3/uL (ref 0.1–0.9)
Monocytes: 9 %
Neutrophils Absolute: 4.1 10*3/uL (ref 1.4–7.0)
Neutrophils: 61 %
Platelets: 253 10*3/uL (ref 150–450)
RBC: 5.37 x10E6/uL (ref 4.14–5.80)
RDW: 13.1 % (ref 11.6–15.4)
WBC: 6.6 10*3/uL (ref 3.4–10.8)

## 2020-08-29 LAB — LIPID PANEL W/O CHOL/HDL RATIO
Cholesterol, Total: 154 mg/dL (ref 100–199)
HDL: 49 mg/dL (ref >39)
LDL Chol Calc (NIH): 87 mg/dL (ref 0–99)
Triglycerides: 96 mg/dL (ref 0–149)
VLDL Cholesterol Cal: 18 mg/dL (ref 5–40)

## 2020-08-29 LAB — HEMOGLOBIN A1C
Est. average glucose Bld gHb Est-mCnc: 131 mg/dL
Hgb A1c MFr Bld: 6.2 % — ABNORMAL HIGH (ref 4.8–5.6)

## 2020-08-29 LAB — TSH: TSH: 0.925 u[IU]/mL (ref 0.450–4.500)

## 2020-08-29 LAB — PSA: Prostate Specific Ag, Serum: 1.2 ng/mL (ref 0.0–4.0)

## 2020-08-29 NOTE — Progress Notes (Signed)
Contacted via MyChart The 10-year ASCVD risk score [name], et al., 2013) is: 7.3%   Values used to calculate the score:     Age: 59 years     Sex: Male     Is Non-Hispanic African American: No     Diabetic: No     Tobacco smoker: No     Systolic Blood Pressure: 417 mmHg     Is BP treated: Yes     HDL Cholesterol: 49 mg/dL     Total Cholesterol: 154 mg/dL  Good evening Craig Ryan, your labs have returned and overall they continue to be stable.  A1C has raised up a little bit from previous of 5.7%, now 6.2%.  Still in prediabetic range.  Goal is for this now to reach 6.5% or greater.  Continue to focus on diet, reducing carbohydrate and sugar rich foods, plus regular exercise. We will recheck next visit and continue to monitor.  If you ever have any increased urination, increased thirst, or increase appetite please let us know right away -- these can be signs of diabetes.  Any questions? Keep being awesome!!  Thank you for allowing me to participate in your care. Kindest regards, Zacharias Ridling

## 2020-09-26 DIAGNOSIS — D225 Melanocytic nevi of trunk: Secondary | ICD-10-CM | POA: Diagnosis not present

## 2020-09-26 DIAGNOSIS — L817 Pigmented purpuric dermatosis: Secondary | ICD-10-CM | POA: Diagnosis not present

## 2020-09-26 DIAGNOSIS — C4441 Basal cell carcinoma of skin of scalp and neck: Secondary | ICD-10-CM | POA: Diagnosis not present

## 2020-09-26 DIAGNOSIS — Z85828 Personal history of other malignant neoplasm of skin: Secondary | ICD-10-CM | POA: Diagnosis not present

## 2020-09-26 DIAGNOSIS — D485 Neoplasm of uncertain behavior of skin: Secondary | ICD-10-CM | POA: Diagnosis not present

## 2020-09-26 DIAGNOSIS — L723 Sebaceous cyst: Secondary | ICD-10-CM | POA: Diagnosis not present

## 2020-09-26 DIAGNOSIS — C44612 Basal cell carcinoma of skin of right upper limb, including shoulder: Secondary | ICD-10-CM | POA: Diagnosis not present

## 2020-10-07 DIAGNOSIS — M5431 Sciatica, right side: Secondary | ICD-10-CM | POA: Diagnosis not present

## 2020-10-07 DIAGNOSIS — M9905 Segmental and somatic dysfunction of pelvic region: Secondary | ICD-10-CM | POA: Diagnosis not present

## 2020-10-07 DIAGNOSIS — M5416 Radiculopathy, lumbar region: Secondary | ICD-10-CM | POA: Diagnosis not present

## 2020-10-07 DIAGNOSIS — M9903 Segmental and somatic dysfunction of lumbar region: Secondary | ICD-10-CM | POA: Diagnosis not present

## 2020-10-10 DIAGNOSIS — Z1152 Encounter for screening for COVID-19: Secondary | ICD-10-CM | POA: Diagnosis not present

## 2020-10-10 DIAGNOSIS — Z03818 Encounter for observation for suspected exposure to other biological agents ruled out: Secondary | ICD-10-CM | POA: Diagnosis not present

## 2020-10-13 DIAGNOSIS — Z1152 Encounter for screening for COVID-19: Secondary | ICD-10-CM | POA: Diagnosis not present

## 2020-10-13 DIAGNOSIS — Z03818 Encounter for observation for suspected exposure to other biological agents ruled out: Secondary | ICD-10-CM | POA: Diagnosis not present

## 2020-10-24 DIAGNOSIS — C44612 Basal cell carcinoma of skin of right upper limb, including shoulder: Secondary | ICD-10-CM | POA: Diagnosis not present

## 2020-11-03 DIAGNOSIS — M5431 Sciatica, right side: Secondary | ICD-10-CM | POA: Diagnosis not present

## 2020-11-03 DIAGNOSIS — M9903 Segmental and somatic dysfunction of lumbar region: Secondary | ICD-10-CM | POA: Diagnosis not present

## 2020-11-03 DIAGNOSIS — M9905 Segmental and somatic dysfunction of pelvic region: Secondary | ICD-10-CM | POA: Diagnosis not present

## 2020-11-03 DIAGNOSIS — M5416 Radiculopathy, lumbar region: Secondary | ICD-10-CM | POA: Diagnosis not present

## 2020-11-20 DIAGNOSIS — C4441 Basal cell carcinoma of skin of scalp and neck: Secondary | ICD-10-CM | POA: Diagnosis not present

## 2020-11-25 DIAGNOSIS — N401 Enlarged prostate with lower urinary tract symptoms: Secondary | ICD-10-CM | POA: Diagnosis not present

## 2020-11-25 DIAGNOSIS — Z125 Encounter for screening for malignant neoplasm of prostate: Secondary | ICD-10-CM | POA: Diagnosis not present

## 2020-11-25 DIAGNOSIS — N23 Unspecified renal colic: Secondary | ICD-10-CM | POA: Diagnosis not present

## 2020-11-27 ENCOUNTER — Other Ambulatory Visit: Payer: Self-pay | Admitting: Urology

## 2020-11-27 DIAGNOSIS — R109 Unspecified abdominal pain: Secondary | ICD-10-CM

## 2020-11-27 DIAGNOSIS — N200 Calculus of kidney: Secondary | ICD-10-CM

## 2020-12-08 DIAGNOSIS — M9905 Segmental and somatic dysfunction of pelvic region: Secondary | ICD-10-CM | POA: Diagnosis not present

## 2020-12-08 DIAGNOSIS — M5431 Sciatica, right side: Secondary | ICD-10-CM | POA: Diagnosis not present

## 2020-12-08 DIAGNOSIS — M5416 Radiculopathy, lumbar region: Secondary | ICD-10-CM | POA: Diagnosis not present

## 2020-12-08 DIAGNOSIS — M9903 Segmental and somatic dysfunction of lumbar region: Secondary | ICD-10-CM | POA: Diagnosis not present

## 2020-12-09 DIAGNOSIS — Z20822 Contact with and (suspected) exposure to covid-19: Secondary | ICD-10-CM | POA: Diagnosis not present

## 2020-12-09 DIAGNOSIS — Z03818 Encounter for observation for suspected exposure to other biological agents ruled out: Secondary | ICD-10-CM | POA: Diagnosis not present

## 2020-12-12 ENCOUNTER — Ambulatory Visit: Payer: BC Managed Care – PPO

## 2021-01-05 DIAGNOSIS — M9903 Segmental and somatic dysfunction of lumbar region: Secondary | ICD-10-CM | POA: Diagnosis not present

## 2021-01-05 DIAGNOSIS — M5416 Radiculopathy, lumbar region: Secondary | ICD-10-CM | POA: Diagnosis not present

## 2021-01-05 DIAGNOSIS — M5431 Sciatica, right side: Secondary | ICD-10-CM | POA: Diagnosis not present

## 2021-01-05 DIAGNOSIS — M9905 Segmental and somatic dysfunction of pelvic region: Secondary | ICD-10-CM | POA: Diagnosis not present

## 2021-02-02 DIAGNOSIS — M5431 Sciatica, right side: Secondary | ICD-10-CM | POA: Diagnosis not present

## 2021-02-02 DIAGNOSIS — M5416 Radiculopathy, lumbar region: Secondary | ICD-10-CM | POA: Diagnosis not present

## 2021-02-02 DIAGNOSIS — M9905 Segmental and somatic dysfunction of pelvic region: Secondary | ICD-10-CM | POA: Diagnosis not present

## 2021-02-02 DIAGNOSIS — M9903 Segmental and somatic dysfunction of lumbar region: Secondary | ICD-10-CM | POA: Diagnosis not present

## 2021-03-02 ENCOUNTER — Other Ambulatory Visit: Payer: Self-pay

## 2021-03-02 ENCOUNTER — Ambulatory Visit: Payer: BC Managed Care – PPO | Admitting: Nurse Practitioner

## 2021-03-02 ENCOUNTER — Encounter: Payer: Self-pay | Admitting: Nurse Practitioner

## 2021-03-02 VITALS — BP 138/88 | HR 84 | Temp 98.2°F | Wt 212.4 lb

## 2021-03-02 DIAGNOSIS — R7303 Prediabetes: Secondary | ICD-10-CM | POA: Diagnosis not present

## 2021-03-02 DIAGNOSIS — I10 Essential (primary) hypertension: Secondary | ICD-10-CM | POA: Diagnosis not present

## 2021-03-02 DIAGNOSIS — Z1211 Encounter for screening for malignant neoplasm of colon: Secondary | ICD-10-CM

## 2021-03-02 DIAGNOSIS — R7989 Other specified abnormal findings of blood chemistry: Secondary | ICD-10-CM

## 2021-03-02 DIAGNOSIS — N401 Enlarged prostate with lower urinary tract symptoms: Secondary | ICD-10-CM

## 2021-03-02 DIAGNOSIS — E782 Mixed hyperlipidemia: Secondary | ICD-10-CM

## 2021-03-02 DIAGNOSIS — E669 Obesity, unspecified: Secondary | ICD-10-CM

## 2021-03-02 DIAGNOSIS — R3912 Poor urinary stream: Secondary | ICD-10-CM

## 2021-03-02 LAB — BAYER DCA HB A1C WAIVED: HB A1C (BAYER DCA - WAIVED): 6.1 % (ref <7.0)

## 2021-03-02 MED ORDER — TAMSULOSIN HCL 0.4 MG PO CAPS: 0.4000 mg | ORAL_CAPSULE | Freq: Every day | ORAL | 3 refills | Status: DC

## 2021-03-02 NOTE — Assessment & Plan Note (Signed)
Chronic, stable. Continue current regimen. Check lipid panel today. Follow-up in 6 months. 

## 2021-03-02 NOTE — Assessment & Plan Note (Addendum)
A1C today is 6.1 and still in the pre-diabetic range. Discussed diet and exercise. Goal is to lose 1-2 pounds per week.

## 2021-03-02 NOTE — Assessment & Plan Note (Signed)
Chronic, stable. BP 138/88 today. Continue current regimen. Check CMP, CBC today. Follow-up in 6 months

## 2021-03-02 NOTE — Assessment & Plan Note (Addendum)
BMI 32.77 today. Discussed diet and exercise. He has been trying to watch the amount of carbs and sugars that he is eating. Goal is to lose 1-2 pounds per week.

## 2021-03-02 NOTE — Patient Instructions (Signed)

## 2021-03-02 NOTE — Progress Notes (Signed)
Established Patient Office Visit  Subjective:  Patient ID: Craig Ryan, male    DOB: 02-20-1961  Age: 60 y.o. MRN: [id number]  CC:  Chief Complaint  Patient presents with  . Hyperlipidemia  . Hypertension  . Prediabetes    HPI Craig Ryan presents for follow-up on hypertension, hyperlipidemia, and prediabetes.   HYPERTENSION / HYPERLIPIDEMIA  Satisfied with current treatment? yes Duration of hypertension: chronic BP monitoring frequency: a few times a month BP range:   BP medication side effects: no Past BP meds: amlodipine, lisinopril, toprol xl Duration of hyperlipidemia: chronic Cholesterol medication side effects: no Cholesterol supplements: none Past cholesterol medications: lipitor Medication compliance: excellent compliance Aspirin: yes Recent stressors: no Recurrent headaches: no Visual changes: no Palpitations: no Dyspnea: no Chest pain: no Lower extremity edema: yes, intermittently Dizzy/lightheaded: no  Past Medical History:  Diagnosis Date  . Allergy   . History of shingles   . Hyperlipidemia   . Hypertension   . Nephrolithiasis   . OSA on CPAP     Past Surgical History:  Procedure Laterality Date  . COLONOSCOPY    . EXTRACORPOREAL SHOCK WAVE LITHOTRIPSY Left 10/16/2015   Procedure: EXTRACORPOREAL SHOCK WAVE LITHOTRIPSY (ESWL);  Surgeon: Royston Cowper, MD;  Location: [facility];  Service: Urology;  Laterality: Left;  . EXTRACORPOREAL SHOCK WAVE LITHOTRIPSY Left 10/30/2015   Procedure: EXTRACORPOREAL SHOCK WAVE LITHOTRIPSY (ESWL);  Surgeon: Royston Cowper, MD;  Location: [facility];  Service: Urology;  Laterality: Left;  . EXTRACORPOREAL SHOCK WAVE LITHOTRIPSY Left 09/20/2019   Procedure: EXTRACORPOREAL SHOCK WAVE LITHOTRIPSY (ESWL);  Surgeon: Royston Cowper, MD;  Location: [facility];  Service: Urology;  Laterality: Left;  . LITHOTRIPSY  Feb 2016, 2009    Family History  Problem Relation Age of Onset  . Heart disease Mother   .  Hyperlipidemia Mother   . Lung disease Mother   . Heart disease Father   . Hyperlipidemia Father   . Heart disease Brother   . Diabetes Brother     Social History   Socioeconomic History  . Marital status: Married    Spouse name: Not on file  . Number of children: Not on file  . Years of education: Not on file  . Highest education level: Not on file  Occupational History  . Not on file  Tobacco Use  . Smoking status: Never Smoker  . Smokeless tobacco: Never Used  Vaping Use  . Vaping Use: Never used  Substance and Sexual Activity  . Alcohol use: No  . Drug use: No  . Sexual activity: Not on file  Other Topics Concern  . Not on file  Social History Narrative  . Not on file   Social Determinants of Health   Financial Resource Strain: Not on file  Food Insecurity: Not on file  Transportation Needs: Not on file  Physical Activity: Not on file  Stress: Not on file  Social Connections: Not on file  Intimate Partner Violence: Not on file    Outpatient Medications Prior to Visit  Medication Sig Dispense Refill  . amLODipine (NORVASC) 5 MG tablet Take 1 tablet (5 mg total) by mouth daily. 90 tablet 4  . aspirin EC 81 MG tablet Take 81 mg by mouth daily.    Marland Kitchen atorvastatin (LIPITOR) 40 MG tablet TAKE 1 TABLET BY MOUTH ONCE DAILY AT 6PM 90 tablet 4  . Cetirizine HCl (ZYRTEC PO) Take by mouth daily.    . Fish Oil OIL 1 capsule by  Does not apply route daily.    Marland Kitchen lisinopril (ZESTRIL) 40 MG tablet Take 1 tablet (40 mg total) by mouth daily. 90 tablet 4  . metoprolol succinate (TOPROL-XL) 50 MG 24 hr tablet Take 1 tablet (50 mg total) by mouth daily. Take with or immediately following a meal. 90 tablet 4  . Multiple Vitamin (MULTIVITAMIN) capsule Take 1 capsule by mouth daily.    Marland Kitchen pyridoxine (B-6) 100 MG tablet Take 100 mg by mouth daily.    Marland Kitchen UNABLE TO FIND as needed. Fluticasone, nasal spray. 2 times daily     . VITAMIN D PO Take by mouth 2 (two) times daily.    .  tamsulosin (FLOMAX) 0.4 MG CAPS capsule Take 0.4 mg by mouth daily.     No facility-administered medications prior to visit.    No Known Allergies  ROS Review of Systems  Constitutional: Positive for fatigue. Negative for fever.  HENT: Positive for sinus pressure. Negative for congestion, postnasal drip and rhinorrhea.   Eyes: Negative.   Respiratory: Negative.   Cardiovascular: Negative.   Gastrointestinal: Negative.   Endocrine: Negative.   Genitourinary: Negative.   Musculoskeletal: Negative.   Skin: Negative.   Allergic/Immunologic: Positive for environmental allergies.  Neurological: Negative.   Psychiatric/Behavioral: Negative.       Objective:    Physical Exam Vitals and nursing note reviewed.  Constitutional:      Appearance: Normal appearance.  HENT:     Head: Normocephalic.  Eyes:     Conjunctiva/sclera: Conjunctivae normal.  Cardiovascular:     Rate and Rhythm: Normal rate and regular rhythm.     Pulses: Normal pulses.     Heart sounds: Normal heart sounds.  Pulmonary:     Effort: Pulmonary effort is normal.     Breath sounds: Normal breath sounds.  Musculoskeletal:     Cervical back: Normal range of motion.     Right lower leg: Edema (trace non-pitting in ankle) present.     Left lower leg: No edema.  Skin:    General: Skin is warm and dry.  Neurological:     General: No focal deficit present.     Mental Status: He is alert and oriented to person, place, and time.  Psychiatric:        Mood and Affect: Mood normal.        Behavior: Behavior normal.        Thought Content: Thought content normal.        Judgment: Judgment normal.     BP 138/88 (BP Location: Right Arm, Patient Position: Sitting)   Pulse 84   Temp 98.2 F (36.8 C)   Wt 212 lb 6 oz (96.3 kg)   SpO2 100%   BMI 32.77 kg/m  Wt Readings from Last 3 Encounters:  03/02/21 212 lb 6 oz (96.3 kg)  08/28/20 215 lb 12.8 oz (97.9 kg)  05/28/19 209 lb (94.8 kg)     Health  Maintenance Due  Topic Date Due  . COLONOSCOPY (Pts 45-48yr Insurance coverage will need to be confirmed)  02/17/2021    There are no preventive care reminders to display for this patient.  Lab Results  Component Value Date   TSH 0.925 08/28/2020   Lab Results  Component Value Date   WBC 6.6 08/28/2020   HGB 16.8 08/28/2020   HCT 48.0 08/28/2020   MCV 89 08/28/2020   PLT 253 08/28/2020   Lab Results  Component Value Date   NA 144 08/28/2020  K 4.1 08/28/2020   CO2 22 08/28/2020   GLUCOSE 105 (H) 08/28/2020   BUN 16 08/28/2020   CREATININE 1.08 08/28/2020   BILITOT 0.4 08/28/2020   ALKPHOS 93 08/28/2020   AST 26 08/28/2020   ALT 39 08/28/2020   PROT 7.2 08/28/2020   ALBUMIN 4.3 08/28/2020   CALCIUM 9.3 08/28/2020   Lab Results  Component Value Date   CHOL 154 08/28/2020   Lab Results  Component Value Date   HDL 49 08/28/2020   Lab Results  Component Value Date   LDLCALC 87 08/28/2020   Lab Results  Component Value Date   TRIG 96 08/28/2020   Lab Results  Component Value Date   CHOLHDL 3.3 05/28/2019   Lab Results  Component Value Date   HGBA1C 6.2 (H) 08/28/2020      Assessment & Plan:   Problem List Items Addressed This Visit      Cardiovascular and Mediastinum   Essential hypertension    Chronic, stable. BP 138/88 today. Continue current regimen. Check CMP, CBC today. Follow-up in 6 months      Relevant Orders   Comp Met (CMET)   CBC w/Diff     Other   Hyperlipidemia    Chronic, stable. Continue current regimen. Check lipid panel today. Follow-up in 6 months.      Relevant Orders   Lipid Panel w/o Chol/HDL Ratio   Prediabetes    A1C today is 6.1 and still in the pre-diabetic range. Discussed diet and exercise. Goal is to lose 1-2 pounds per week.       Relevant Orders   Bayer DCA Hb A1c Waived   Benign prostatic hyperplasia with weak urinary stream    He has a history of kidney stones and was started on flomax. Since he  started the flomax, he noticed that his urinary stream has improved and he is voiding less often at night. Last PSA was normal. Will continue flomax daily.       Obesity (BMI 30-39.9)    BMI 32.77 today. Discussed diet and exercise. He has been trying to watch the amount of carbs and sugars that he is eating. Goal is to lose 1-2 pounds per week.       Other Visit Diagnoses    Screening for colon cancer    -  Primary   Referral placed to GI for colonoscopy   Relevant Orders   Ambulatory referral to Gastroenterology      Meds ordered this encounter  Medications  . tamsulosin (FLOMAX) 0.4 MG CAPS capsule    Sig: Take 1 capsule (0.4 mg total) by mouth daily.    Dispense:  90 capsule    Refill:  3    Follow-up: Return in about 6 months (around 09/01/2021) for physical.    Charyl Dancer, NP

## 2021-03-02 NOTE — Assessment & Plan Note (Signed)
He has a history of kidney stones and was started on flomax. Since he started the flomax, he noticed that his urinary stream has improved and he is voiding less often at night. Last PSA was normal. Will continue flomax daily.

## 2021-03-03 LAB — CBC WITH DIFFERENTIAL/PLATELET
Basophils Absolute: 0.1 10*3/uL (ref 0.0–0.2)
Basos: 1 %
EOS (ABSOLUTE): 0.2 10*3/uL (ref 0.0–0.4)
Eos: 2 %
Hematocrit: 46.8 % (ref 37.5–51.0)
Hemoglobin: 15.5 g/dL (ref 13.0–17.7)
Immature Grans (Abs): 0 10*3/uL (ref 0.0–0.1)
Immature Granulocytes: 0 %
Lymphocytes Absolute: 1.8 10*3/uL (ref 0.7–3.1)
Lymphs: 26 %
MCH: 30 pg (ref 26.6–33.0)
MCHC: 33.1 g/dL (ref 31.5–35.7)
MCV: 91 fL (ref 79–97)
Monocytes Absolute: 0.6 10*3/uL (ref 0.1–0.9)
Monocytes: 9 %
Neutrophils Absolute: 4.4 10*3/uL (ref 1.4–7.0)
Neutrophils: 62 %
Platelets: 218 10*3/uL (ref 150–450)
RBC: 5.17 x10E6/uL (ref 4.14–5.80)
RDW: 13 % (ref 11.6–15.4)
WBC: 7.1 10*3/uL (ref 3.4–10.8)

## 2021-03-03 LAB — COMPREHENSIVE METABOLIC PANEL
ALT: 34 IU/L (ref 0–44)
AST: 20 IU/L (ref 0–40)
Albumin/Globulin Ratio: 1.4 (ref 1.2–2.2)
Albumin: 4.1 g/dL (ref 3.8–4.9)
Alkaline Phosphatase: 97 IU/L (ref 44–121)
BUN/Creatinine Ratio: 15 (ref 9–20)
BUN: 20 mg/dL (ref 6–24)
Bilirubin Total: 0.4 mg/dL (ref 0.0–1.2)
CO2: 21 mmol/L (ref 20–29)
Calcium: 9.3 mg/dL (ref 8.7–10.2)
Chloride: 104 mmol/L (ref 96–106)
Creatinine, Ser: 1.31 mg/dL — ABNORMAL HIGH (ref 0.76–1.27)
Globulin, Total: 3 g/dL (ref 1.5–4.5)
Glucose: 95 mg/dL (ref 65–99)
Potassium: 3.7 mmol/L (ref 3.5–5.2)
Sodium: 144 mmol/L (ref 134–144)
Total Protein: 7.1 g/dL (ref 6.0–8.5)
eGFR: 63 mL/min/{1.73_m2} (ref >59)

## 2021-03-03 LAB — LIPID PANEL W/O CHOL/HDL RATIO
Cholesterol, Total: 166 mg/dL (ref 100–199)
HDL: 49 mg/dL (ref >39)
LDL Chol Calc (NIH): 95 mg/dL (ref 0–99)
Triglycerides: 124 mg/dL (ref 0–149)
VLDL Cholesterol Cal: 22 mg/dL (ref 5–40)

## 2021-03-03 NOTE — Addendum Note (Signed)
Addended by: Vance Peper A on: 03/03/2021 08:11 AM   Modules accepted: Orders

## 2021-03-09 ENCOUNTER — Ambulatory Visit: Payer: BC Managed Care – PPO

## 2021-03-09 ENCOUNTER — Other Ambulatory Visit: Payer: Self-pay

## 2021-03-09 ENCOUNTER — Other Ambulatory Visit: Payer: BC Managed Care – PPO

## 2021-03-09 DIAGNOSIS — M5416 Radiculopathy, lumbar region: Secondary | ICD-10-CM | POA: Diagnosis not present

## 2021-03-09 DIAGNOSIS — M9905 Segmental and somatic dysfunction of pelvic region: Secondary | ICD-10-CM | POA: Diagnosis not present

## 2021-03-09 DIAGNOSIS — M5431 Sciatica, right side: Secondary | ICD-10-CM | POA: Diagnosis not present

## 2021-03-09 DIAGNOSIS — M9903 Segmental and somatic dysfunction of lumbar region: Secondary | ICD-10-CM | POA: Diagnosis not present

## 2021-03-09 DIAGNOSIS — R7989 Other specified abnormal findings of blood chemistry: Secondary | ICD-10-CM | POA: Diagnosis not present

## 2021-03-09 DIAGNOSIS — Z23 Encounter for immunization: Secondary | ICD-10-CM

## 2021-03-09 LAB — URINALYSIS, ROUTINE W REFLEX MICROSCOPIC
Bilirubin, UA: NEGATIVE
Glucose, UA: NEGATIVE
Ketones, UA: NEGATIVE
Leukocytes,UA: NEGATIVE
Nitrite, UA: NEGATIVE
Protein,UA: NEGATIVE
RBC, UA: NEGATIVE
Specific Gravity, UA: 1.02 (ref 1.005–1.030)
Urobilinogen, Ur: 0.2 mg/dL (ref 0.2–1.0)
pH, UA: 6.5 (ref 5.0–7.5)

## 2021-03-10 LAB — BASIC METABOLIC PANEL
BUN/Creatinine Ratio: 14 (ref 9–20)
BUN: 17 mg/dL (ref 6–24)
CO2: 23 mmol/L (ref 20–29)
Calcium: 9.2 mg/dL (ref 8.7–10.2)
Chloride: 103 mmol/L (ref 96–106)
Creatinine, Ser: 1.22 mg/dL (ref 0.76–1.27)
Glucose: 95 mg/dL (ref 65–99)
Potassium: 4 mmol/L (ref 3.5–5.2)
Sodium: 142 mmol/L (ref 134–144)
eGFR: 68 mL/min/{1.73_m2} (ref >59)

## 2021-03-30 DIAGNOSIS — D225 Melanocytic nevi of trunk: Secondary | ICD-10-CM | POA: Diagnosis not present

## 2021-03-30 DIAGNOSIS — L821 Other seborrheic keratosis: Secondary | ICD-10-CM | POA: Diagnosis not present

## 2021-03-30 DIAGNOSIS — Z85828 Personal history of other malignant neoplasm of skin: Secondary | ICD-10-CM | POA: Diagnosis not present

## 2021-03-30 DIAGNOSIS — D2262 Melanocytic nevi of left upper limb, including shoulder: Secondary | ICD-10-CM | POA: Diagnosis not present

## 2021-03-31 DIAGNOSIS — Z1211 Encounter for screening for malignant neoplasm of colon: Secondary | ICD-10-CM | POA: Diagnosis not present

## 2021-03-31 DIAGNOSIS — K649 Unspecified hemorrhoids: Secondary | ICD-10-CM | POA: Diagnosis not present

## 2021-04-01 ENCOUNTER — Other Ambulatory Visit: Payer: Self-pay | Admitting: General Surgery
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
## 2021-04-07 DIAGNOSIS — M9903 Segmental and somatic dysfunction of lumbar region: Secondary | ICD-10-CM | POA: Diagnosis not present

## 2021-04-07 DIAGNOSIS — M5416 Radiculopathy, lumbar region: Secondary | ICD-10-CM | POA: Diagnosis not present

## 2021-04-07 DIAGNOSIS — M5431 Sciatica, right side: Secondary | ICD-10-CM | POA: Diagnosis not present

## 2021-04-07 DIAGNOSIS — M9905 Segmental and somatic dysfunction of pelvic region: Secondary | ICD-10-CM | POA: Diagnosis not present

## 2021-04-21 ENCOUNTER — Encounter: Payer: Self-pay | Admitting: General Surgery

## 2021-04-22 ENCOUNTER — Encounter
Admission: RE | Disposition: A | Discharge status: Home / Self Care | Payer: Self-pay | Source: Home / Self Care | Attending: General Surgery

## 2021-04-22 ENCOUNTER — Ambulatory Visit
Admission: RE | Admit: 2021-04-22 | Discharge: 2021-04-22 | Disposition: A | Discharge status: Home / Self Care | Payer: BC Managed Care – PPO | Attending: General Surgery | Admitting: General Surgery

## 2021-04-22 ENCOUNTER — Ambulatory Visit: Payer: BC Managed Care – PPO | Admitting: Anesthesiology

## 2021-04-22 DIAGNOSIS — Z79899 Other long term (current) drug therapy: Secondary | ICD-10-CM | POA: Insufficient documentation

## 2021-04-22 DIAGNOSIS — Z7982 Long term (current) use of aspirin: Secondary | ICD-10-CM | POA: Insufficient documentation

## 2021-04-22 DIAGNOSIS — K5730 Diverticulosis of large intestine without perforation or abscess without bleeding: Secondary | ICD-10-CM | POA: Diagnosis not present

## 2021-04-22 DIAGNOSIS — Z1211 Encounter for screening for malignant neoplasm of colon: Secondary | ICD-10-CM | POA: Insufficient documentation

## 2021-04-22 DIAGNOSIS — D120 Benign neoplasm of cecum: Secondary | ICD-10-CM | POA: Diagnosis not present

## 2021-04-22 DIAGNOSIS — K635 Polyp of colon: Secondary | ICD-10-CM | POA: Diagnosis not present

## 2021-04-22 HISTORY — PX: COLONOSCOPY WITH PROPOFOL: SHX5780

## 2021-04-22 SURGERY — COLONOSCOPY WITH PROPOFOL
Anesthesia: General

## 2021-04-22 MED ORDER — SODIUM CHLORIDE 0.9 % IV SOLN
INTRAVENOUS | Status: DC
  Administered 2021-04-22: 20 mL/h via INTRAVENOUS

## 2021-04-22 MED ORDER — EPHEDRINE SULFATE 50 MG/ML IJ SOLN: INTRAMUSCULAR | Status: DC | PRN

## 2021-04-22 MED ORDER — PROPOFOL 500 MG/50ML IV EMUL
INTRAVENOUS | Status: DC | PRN
  Administered 2021-04-22: 155 ug/kg/min via INTRAVENOUS

## 2021-04-22 MED ORDER — PROPOFOL 500 MG/50ML IV EMUL
INTRAVENOUS | Status: AC
  Filled 2021-04-22: qty 150

## 2021-04-22 MED ORDER — GLYCOPYRROLATE 0.2 MG/ML IJ SOLN
INTRAMUSCULAR | Status: DC | PRN
  Administered 2021-04-22: .2 mg via INTRAVENOUS

## 2021-04-22 MED ORDER — PROPOFOL 10 MG/ML IV BOLUS
INTRAVENOUS | Status: DC | PRN
  Administered 2021-04-22: 60 mg via INTRAVENOUS
  Administered 2021-04-22 (×2): 10 mg via INTRAVENOUS

## 2021-04-22 MED ORDER — LIDOCAINE HCL (CARDIAC) PF 100 MG/5ML IV SOSY
PREFILLED_SYRINGE | INTRAVENOUS | Status: DC | PRN
  Administered 2021-04-22: 100 mg via INTRAVENOUS

## 2021-04-22 NOTE — Anesthesia Postprocedure Evaluation (Signed)
Anesthesia Post Note  Patient: Craig Ryan  Procedure(s) Performed: COLONOSCOPY WITH PROPOFOL  Patient location during evaluation: Endoscopy Anesthesia Type: General Level of consciousness: awake and alert and oriented Pain management: pain level controlled Vital Signs Assessment: post-procedure vital signs reviewed and stable Respiratory status: spontaneous breathing, nonlabored ventilation and respiratory function stable Cardiovascular status: blood pressure returned to baseline and stable Postop Assessment: no signs of nausea or vomiting Anesthetic complications: no   No notable events documented.   Last Vitals:  Vitals:   04/22/21 0816 04/22/21 0826  BP: 117/75 126/90  Pulse: 76 66  Resp: 12 13  Temp:    SpO2: 96% 98%    Last Pain:  Vitals:   04/22/21 0826  TempSrc:   PainSc: 0-No pain                 Lyndee Herbst

## 2021-04-22 NOTE — Op Note (Signed)
[facility] Gastroenterology Patient Name: Craig Ryan Procedure Date: 04/22/2021 7:23 AM MRN: [id number] Account #: [id number] Date of Birth: 10-Jan-1961 Admit Type: Outpatient Age: 60 Room: [facility] ENDO ROOM 1 Gender: Male Note Status: Finalized Procedure:             Colonoscopy Indications:           Screening for colorectal malignant neoplasm Providers:             Robert Bellow, MD Referring MD:          Charlynne Cousins (Referring MD) Medicines:             Propofol per Anesthesia Complications:         No immediate complications. Procedure:             Pre-Anesthesia Assessment:                        - Prior to the procedure, a History and Physical was                         performed, and patient medications, allergies and                         sensitivities were reviewed. The patient's tolerance                         of previous anesthesia was reviewed.                        - The risks and benefits of the procedure and the                         sedation options and risks were discussed with the                         patient. All questions were answered and informed                         consent was obtained.                        After obtaining informed consent, the colonoscope was                         passed under direct vision. Throughout the procedure,                         the patient's blood pressure, pulse, and oxygen                         saturations were monitored continuously. The                         Colonoscope was introduced through the anus and                         advanced to the the cecum, identified by appendiceal                         orifice and ileocecal valve.  The colonoscopy was                         performed without difficulty. The patient tolerated                         the procedure well. The quality of the bowel                         preparation was excellent. Findings:      A 3 mm polyp was  found in the cecum. The polyp was sessile. Biopsies       were taken with a cold forceps for histology.      A few small-mouthed diverticula were found in the sigmoid colon and       descending colon.      The retroflexed view of the distal rectum and anal verge was normal and       showed no anal or rectal abnormalities. Impression:            - One 3 mm polyp in the cecum. Biopsied.                        - Diverticulosis in the sigmoid colon and in the                         descending colon.                        - The distal rectum and anal verge are normal on                         retroflexion view. Recommendation:        - Telephone endoscopist for pathology results in 1                         week. Procedure Code(s):     --- Professional ---                        [phone], Colonoscopy, flexible; with biopsy, single or                         multiple Diagnosis Code(s):     --- Professional ---                        Z12.11, Encounter for screening for malignant neoplasm                         of colon                        K63.5, Polyp of colon                        K57.30, Diverticulosis of large intestine without                         perforation or abscess without bleeding CPT copyright 2019 American Medical Association. All rights reserved. The codes documented in this report are preliminary and upon coder review may  be revised to  meet current compliance requirements. Robert Bellow, MD 04/22/2021 7:55:04 AM This report has been signed electronically. Number of Addenda: 0 Note Initiated On: 04/22/2021 7:23 AM Scope Withdrawal Time: 0 hours 12 minutes 58 seconds  Total Procedure Duration: 0 hours 17 minutes 18 seconds  Estimated Blood Loss:  Estimated blood loss: none.      [facility]

## 2021-04-22 NOTE — Transfer of Care (Signed)
Immediate Anesthesia Transfer of Care Note  Patient: Craig Ryan  Procedure(s) Performed: COLONOSCOPY WITH PROPOFOL  Patient Location: Endoscopy Unit  Anesthesia Type:General  Level of Consciousness: drowsy and patient cooperative  Airway & Oxygen Therapy: Patient Spontanous Breathing and Patient connected to face mask oxygen  Post-op Assessment: Report given to RN and Post -op Vital signs reviewed and stable  Post vital signs: Reviewed and stable  Last Vitals:  Vitals Value Taken Time  BP 106/66 04/22/21 0755  Temp    Pulse 64 04/22/21 0756  Resp 12 04/22/21 0756  SpO2 97 % 04/22/21 0756  Vitals shown include unvalidated device data.  Last Pain:  Vitals:   04/22/21 0700  TempSrc: Temporal  PainSc: 1          Complications: No notable events documented.

## 2021-04-22 NOTE — H&P (Signed)
Craig Ryan [id number] December 12, 1960     HPI:  Healthy male for screening colonoscopy. Tolerated prep well.    Medications Prior to Admission  Medication Sig Dispense Refill Last Dose   amLODipine (NORVASC) 5 MG tablet Take 1 tablet (5 mg total) by mouth daily. 90 tablet 4 04/22/2021 at 0600   aspirin EC 81 MG tablet Take 81 mg by mouth daily.   Past Week   atorvastatin (LIPITOR) 40 MG tablet TAKE 1 TABLET BY MOUTH ONCE DAILY AT 6PM 90 tablet 4 04/21/2021   Cetirizine HCl (ZYRTEC PO) Take by mouth daily.   04/21/2021   Fish Oil OIL 1 capsule by Does not apply route daily.   Past Week   lisinopril (ZESTRIL) 40 MG tablet Take 1 tablet (40 mg total) by mouth daily. 90 tablet 4 04/21/2021   metoprolol succinate (TOPROL-XL) 50 MG 24 hr tablet Take 1 tablet (50 mg total) by mouth daily. Take with or immediately following a meal. 90 tablet 4 04/22/2021 at 0600   pyridoxine (B-6) 100 MG tablet Take 100 mg by mouth daily.   04/21/2021   tamsulosin (FLOMAX) 0.4 MG CAPS capsule Take 1 capsule (0.4 mg total) by mouth daily. 90 capsule 3 04/21/2021   VITAMIN D PO Take by mouth 2 (two) times daily.   Past Week   Multiple Vitamin (MULTIVITAMIN) capsule Take 1 capsule by mouth daily.      UNABLE TO FIND as needed. Fluticasone, nasal spray. 2 times daily       No Known Allergies Past Medical History:  Diagnosis Date   Allergy    History of shingles    Hyperlipidemia    Hyperlipidemia    Hypertension    Nephrolithiasis    OSA on CPAP    Past Surgical History:  Procedure Laterality Date   COLONOSCOPY     EXTRACORPOREAL SHOCK WAVE LITHOTRIPSY Left 10/16/2015   Procedure: EXTRACORPOREAL SHOCK WAVE LITHOTRIPSY (ESWL);  Surgeon: Royston Cowper, MD;  Location: [facility] ORS;  Service: Urology;  Laterality: Left;   EXTRACORPOREAL SHOCK WAVE LITHOTRIPSY Left 10/30/2015   Procedure: EXTRACORPOREAL SHOCK WAVE LITHOTRIPSY (ESWL);  Surgeon: Royston Cowper, MD;  Location: [facility] ORS;  Service: Urology;  Laterality: Left;    EXTRACORPOREAL SHOCK WAVE LITHOTRIPSY Left 09/20/2019   Procedure: EXTRACORPOREAL SHOCK WAVE LITHOTRIPSY (ESWL);  Surgeon: Royston Cowper, MD;  Location: [facility] ORS;  Service: Urology;  Laterality: Left;   LITHOTRIPSY  Feb 2016, 2009   Social History   Socioeconomic History   Marital status: Married    Spouse name: Not on file   Number of children: Not on file   Years of education: Not on file   Highest education level: Not on file  Occupational History   Not on file  Tobacco Use   Smoking status: Never   Smokeless tobacco: Never  Vaping Use   Vaping Use: Never used  Substance and Sexual Activity   Alcohol use: No   Drug use: No   Sexual activity: Not on file  Other Topics Concern   Not on file  Social History Narrative   Not on file   Social Determinants of Health   Financial Resource Strain: Not on file  Food Insecurity: Not on file  Transportation Needs: Not on file  Physical Activity: Not on file  Stress: Not on file  Social Connections: Not on file  Intimate Partner Violence: Not on file   Social History   Social History Narrative   Not on file  ROS: Negative.     PE: HEENT: Negative. Lungs: Clear. Cardio: RR.   Assessment/Plan:  Proceed with planned endoscopy.    [facility] 04/22/2021

## 2021-04-22 NOTE — Anesthesia Procedure Notes (Signed)
Procedure Name: General with mask airway Date/Time: 04/22/2021 7:31 AM Performed by: Kelton Pillar, CRNA Pre-anesthesia Checklist: Patient identified, Emergency Drugs available, Suction available and Patient being monitored Patient Re-evaluated:Patient Re-evaluated prior to induction Oxygen Delivery Method: Simple face mask Induction Type: IV induction Placement Confirmation: positive ETCO2 and CO2 detector Dental Injury: Teeth and Oropharynx as per pre-operative assessment

## 2021-04-22 NOTE — Anesthesia Preprocedure Evaluation (Signed)
Anesthesia Evaluation  Patient identified by MRN, date of birth, ID band Patient awake    Reviewed: Allergy & Precautions, NPO status , Patient's Chart, lab work & pertinent test results  History of Anesthesia Complications Negative for: history of anesthetic complications  Airway Mallampati: III  TM Distance: <3 FB Neck ROM: Full    Dental no notable dental hx.    Pulmonary sleep apnea and Continuous Positive Airway Pressure Ventilation , neg COPD,    breath sounds clear to auscultation- rhonchi (-) wheezing      Cardiovascular Exercise Tolerance: Good hypertension, Pt. on medications (-) CAD, (-) Past MI, (-) Cardiac Stents and (-) CABG  Rhythm:Regular Rate:Normal - Systolic murmurs and - Diastolic murmurs    Neuro/Psych neg Seizures negative neurological ROS  negative psych ROS   GI/Hepatic negative GI ROS, Neg liver ROS,   Endo/Other  negative endocrine ROSneg diabetes  Renal/GU Renal disease: hx of nephrolithiasis.     Musculoskeletal negative musculoskeletal ROS (+)   Abdominal (+) + obese,   Peds  Hematology negative hematology ROS (+)   Anesthesia Other Findings Past Medical History: No date: Allergy No date: History of shingles No date: Hyperlipidemia No date: Hyperlipidemia No date: Hypertension No date: Nephrolithiasis No date: OSA on CPAP   Reproductive/Obstetrics                             Anesthesia Physical Anesthesia Plan  ASA: 2  Anesthesia Plan: General   Post-op Pain Management:    Induction: Intravenous  PONV Risk Score and Plan: 1 and Propofol infusion  Airway Management Planned: Natural Airway  Additional Equipment:   Intra-op Plan:   Post-operative Plan:   Informed Consent: I have reviewed the patients History and Physical, chart, labs and discussed the procedure including the risks, benefits and alternatives for the proposed anesthesia with  the patient or authorized representative who has indicated his/her understanding and acceptance.     Dental advisory given  Plan Discussed with: CRNA and Anesthesiologist  Anesthesia Plan Comments:         Anesthesia Quick Evaluation

## 2021-04-23 ENCOUNTER — Encounter: Payer: Self-pay | Admitting: General Surgery

## 2021-04-23 LAB — SURGICAL PATHOLOGY: SURGICAL PATHOLOGY: NEGATIVE

## 2021-05-05 DIAGNOSIS — M9905 Segmental and somatic dysfunction of pelvic region: Secondary | ICD-10-CM | POA: Diagnosis not present

## 2021-05-05 DIAGNOSIS — M5416 Radiculopathy, lumbar region: Secondary | ICD-10-CM | POA: Diagnosis not present

## 2021-05-05 DIAGNOSIS — M5431 Sciatica, right side: Secondary | ICD-10-CM | POA: Diagnosis not present

## 2021-05-05 DIAGNOSIS — M9903 Segmental and somatic dysfunction of lumbar region: Secondary | ICD-10-CM | POA: Diagnosis not present

## 2021-06-03 DIAGNOSIS — M9905 Segmental and somatic dysfunction of pelvic region: Secondary | ICD-10-CM | POA: Diagnosis not present

## 2021-06-03 DIAGNOSIS — M5416 Radiculopathy, lumbar region: Secondary | ICD-10-CM | POA: Diagnosis not present

## 2021-06-03 DIAGNOSIS — M5431 Sciatica, right side: Secondary | ICD-10-CM | POA: Diagnosis not present

## 2021-06-03 DIAGNOSIS — M9903 Segmental and somatic dysfunction of lumbar region: Secondary | ICD-10-CM | POA: Diagnosis not present

## 2021-06-29 DIAGNOSIS — M9905 Segmental and somatic dysfunction of pelvic region: Secondary | ICD-10-CM | POA: Diagnosis not present

## 2021-06-29 DIAGNOSIS — M5431 Sciatica, right side: Secondary | ICD-10-CM | POA: Diagnosis not present

## 2021-06-29 DIAGNOSIS — M5416 Radiculopathy, lumbar region: Secondary | ICD-10-CM | POA: Diagnosis not present

## 2021-06-29 DIAGNOSIS — M9903 Segmental and somatic dysfunction of lumbar region: Secondary | ICD-10-CM | POA: Diagnosis not present

## 2021-08-11 DIAGNOSIS — M9905 Segmental and somatic dysfunction of pelvic region: Secondary | ICD-10-CM | POA: Diagnosis not present

## 2021-08-11 DIAGNOSIS — M5431 Sciatica, right side: Secondary | ICD-10-CM | POA: Diagnosis not present

## 2021-08-11 DIAGNOSIS — M5416 Radiculopathy, lumbar region: Secondary | ICD-10-CM | POA: Diagnosis not present

## 2021-08-11 DIAGNOSIS — M9903 Segmental and somatic dysfunction of lumbar region: Secondary | ICD-10-CM | POA: Diagnosis not present

## 2021-08-13 ENCOUNTER — Encounter: Payer: Self-pay | Admitting: Internal Medicine

## 2021-08-13 ENCOUNTER — Other Ambulatory Visit: Payer: Self-pay | Admitting: Nurse Practitioner

## 2021-08-13 NOTE — Telephone Encounter (Signed)
Can you pl let pt know he can go to any [place] and get his 2nd shot

## 2021-08-13 NOTE — Telephone Encounter (Signed)
Requested medication (s) are due for refill today - no  Requested medication (s) are on the active medication list -no  Future visit scheduled -yes  Last refill: 09/02/20  Notes to clinic: Request RF: medication not assigned to protocol  Requested Prescriptions  Pending Prescriptions Disp Refills   [place] injection [Pharmacy Med Name: [facility] SDV/INJ KIT]  13    Sig: INJECT 0.5 ML INTO THE MUSCLE ONCE FOR 1 DOSE (DOSE NUMBER 1)     Off-Protocol Failed - 08/13/2021  8:05 AM      Failed - Medication not assigned to a protocol, review manually.      Passed - Valid encounter within last 12 months    Recent Outpatient Visits           5 months ago Screening for colon cancer   [place], Lauren A, NP   11 months ago PE (physical exam), annual   [place], Barbaraann Faster, NP   1 year ago Lab test positive for detection of COVID-19 virus   [place], [place] T, NP   1 year ago COVID-21   [facility] [name], NP   1 year ago Essential hypertension   [place], Barbaraann Faster, NP       Future Appointments             In 2 weeks Vigg, Avanti, MD [facility], [facility]               Requested Prescriptions  Pending Prescriptions Disp Refills   [place] injection [Pharmacy Med Name: [facility] SDV/INJ KIT]  13    Sig: INJECT 0.5 ML INTO THE MUSCLE ONCE FOR 1 DOSE (DOSE NUMBER 1)     Off-Protocol Failed - 08/13/2021  8:05 AM      Failed - Medication not assigned to a protocol, review manually.      Passed - Valid encounter within last 12 months    Recent Outpatient Visits           5 months ago Screening for colon cancer   [facility] [place], Lauren A, NP   11 months ago PE (physical exam), annual   [place], Barbaraann Faster, NP   1 year ago Lab test positive for detection of COVID-19 virus   [place],  Barbaraann Faster, NP   1 year ago COVID-19   [name], [facility]   1 year ago Essential hypertension   [place], Barbaraann Faster, NP       Future Appointments             In 2 weeks Vigg, Avanti, MD [facility], [facility]

## 2021-09-02 ENCOUNTER — Encounter: Payer: Self-pay | Admitting: Internal Medicine

## 2021-09-02 ENCOUNTER — Ambulatory Visit (INDEPENDENT_AMBULATORY_CARE_PROVIDER_SITE_OTHER): Payer: BC Managed Care – PPO | Admitting: Internal Medicine

## 2021-09-02 ENCOUNTER — Other Ambulatory Visit: Payer: Self-pay

## 2021-09-02 VITALS — BP 130/80 | HR 78 | Temp 98.6°F | Ht 67.32 in | Wt 213.6 lb

## 2021-09-02 DIAGNOSIS — Z23 Encounter for immunization: Secondary | ICD-10-CM

## 2021-09-02 DIAGNOSIS — R7303 Prediabetes: Secondary | ICD-10-CM

## 2021-09-02 DIAGNOSIS — Z0000 Encounter for general adult medical examination without abnormal findings: Secondary | ICD-10-CM | POA: Insufficient documentation

## 2021-09-02 LAB — URINALYSIS, ROUTINE W REFLEX MICROSCOPIC
Bilirubin, UA: NEGATIVE
Glucose, UA: NEGATIVE
Ketones, UA: NEGATIVE
Leukocytes,UA: NEGATIVE
Nitrite, UA: NEGATIVE
RBC, UA: NEGATIVE
Specific Gravity, UA: 1.02 (ref 1.005–1.030)
Urobilinogen, Ur: 0.2 mg/dL (ref 0.2–1.0)
pH, UA: 7 (ref 5.0–7.5)

## 2021-09-02 LAB — BAYER DCA HB A1C WAIVED: HB A1C (BAYER DCA - WAIVED): 5.9 % — ABNORMAL HIGH (ref 4.8–5.6)

## 2021-09-02 MED ORDER — FEXOFENADINE HCL 180 MG PO TABS: 180.0000 mg | ORAL_TABLET | Freq: Every day | ORAL | 1 refills | Status: DC

## 2021-09-02 NOTE — Progress Notes (Signed)
BP 130/80   Pulse 78   Temp 98.6 F (37 C) (Oral)   Ht 5' 7.32" (1.71 m)   Wt 213 lb 9.6 oz (96.9 kg)   SpO2 99%   BMI 33.13 kg/m    Subjective:    Patient ID: Craig Ryan, male    DOB: 1961-05-27, 60 y.o.   MRN: [id number]  Chief Complaint  Patient presents with   Annual Exam    HPI: Craig Ryan is a 60 y.o. male  Pt is here for a physical . Has a pmh of sciatica / HTN/ hld/ no pain right now.   Chief Complaint  Patient presents with   Annual Exam    Relevant past medical, surgical, family and social history reviewed and updated as indicated. Interim medical history since our last visit reviewed. Allergies and medications reviewed and updated.  Review of Systems  Constitutional:  Negative for activity change, appetite change, chills, fatigue and fever.  HENT:  Negative for congestion, ear discharge, ear pain and facial swelling.   Eyes:  Negative for pain, discharge and itching.  Respiratory:  Negative for cough, chest tightness, shortness of breath and wheezing.   Cardiovascular:  Negative for chest pain, palpitations and leg swelling.  Gastrointestinal:  Negative for abdominal distention, abdominal pain, blood in stool, constipation, diarrhea, nausea and vomiting.  Endocrine: Negative for cold intolerance, heat intolerance, polydipsia, polyphagia and polyuria.  Genitourinary:  Negative for difficulty urinating, dysuria, flank pain, frequency, hematuria and urgency.  Musculoskeletal:  Negative for arthralgias, gait problem, joint swelling and myalgias.  Skin:  Negative for color change, rash and wound.  Neurological:  Negative for dizziness, tremors, speech difficulty, weakness, light-headedness, numbness and headaches.  Hematological:  Does not bruise/bleed easily.  Psychiatric/Behavioral:  Negative for agitation, confusion, decreased concentration, sleep disturbance and suicidal ideas.    Per HPI unless specifically indicated above     Objective:     BP 130/80   Pulse 78   Temp 98.6 F (37 C) (Oral)   Ht 5' 7.32" (1.71 m)   Wt 213 lb 9.6 oz (96.9 kg)   SpO2 99%   BMI 33.13 kg/m   Wt Readings from Last 3 Encounters:  09/02/21 213 lb 9.6 oz (96.9 kg)  04/22/21 213 lb (96.6 kg)  03/02/21 212 lb 6 oz (96.3 kg)    Physical Exam Vitals and nursing note reviewed.  Constitutional:      General: He is not in acute distress.    Appearance: Normal appearance. He is not ill-appearing or diaphoretic.  HENT:     Head: Normocephalic and atraumatic.     Right Ear: Tympanic membrane and external ear normal. There is no impacted cerumen.     Left Ear: External ear normal.     Nose: No congestion or rhinorrhea.     Mouth/Throat:     Pharynx: No oropharyngeal exudate or posterior oropharyngeal erythema.  Eyes:     Conjunctiva/sclera: Conjunctivae normal.     Pupils: Pupils are equal, round, and reactive to light.  Cardiovascular:     Rate and Rhythm: Normal rate and regular rhythm.     Heart sounds: No murmur heard.   No friction rub. No gallop.  Pulmonary:     Effort: No respiratory distress.     Breath sounds: No stridor. No wheezing or rhonchi.  Chest:     Chest wall: No tenderness.  Abdominal:     General: Abdomen is flat. Bowel sounds are normal. There is no  distension.     Palpations: Abdomen is soft. There is no mass.     Tenderness: There is no abdominal tenderness. There is no guarding.  Musculoskeletal:        General: No swelling or deformity.     Cervical back: Normal range of motion and neck supple. No rigidity or tenderness.     Right lower leg: No edema.     Left lower leg: No edema.  Skin:    General: Skin is warm and dry.     Coloration: Skin is not jaundiced.     Findings: No erythema.  Neurological:     Mental Status: He is alert and oriented to person, place, and time. Mental status is at baseline.  Psychiatric:        Mood and Affect: Mood normal.        Behavior: Behavior normal.        Thought Content:  Thought content normal.        Judgment: Judgment normal.    Results for orders placed or performed during the hospital encounter of 04/22/21  Surgical pathology  Result Value Ref Range   SURGICAL PATHOLOGY      SURGICAL PATHOLOGY CASE: [id number] PATIENT: Craig Ryan Surgical Pathology Report     Specimen Submitted: A. Colon polyp, cecum; cbx  Clinical History: Screening. Findings: Colon polyp, diverticulosis     DIAGNOSIS: A.  COLON POLYP, CECUM; COLD BIOPSY: - TUBULAR ADENOMA. - NEGATIVE FOR HIGH-GRADE DYSPLASIA AND MALIGNANCY.  GROSS DESCRIPTION: A. Labeled: cbx polyp cecum Received: Formalin Collection time: 7:40 AM on 04/22/2021 Placed into formalin time: 7:40 AM on 04/22/2021 Tissue fragment(s): 1 Size: 0.7 x 0.3 x 0.1 cm Description: Tan soft tissue fragment Entirely submitted in 1 cassette.  RB 04/22/2021  Final Diagnosis performed by Bryan Lemma, MD.   Electronically signed 04/23/2021 6:50:00PM The electronic signature indicates that the named Attending Pathologist has evaluated the specimen Technical component performed at [facility] [address] [place], [place] [address] Lab: [phone] Dir: Rush Farmer, MD, MMM  Professional component per formed at [facility], [address], [place], [place], [place] [address] Lab: [phone] Dir: Dellia Nims. Rubinas, MD         Current Outpatient Medications:    amLODipine (NORVASC) 5 MG tablet, Take 1 tablet (5 mg total) by mouth daily., Disp: 90 tablet, Rfl: 4   aspirin EC 81 MG tablet, Take 81 mg by mouth daily., Disp: , Rfl:    atorvastatin (LIPITOR) 40 MG tablet, TAKE 1 TABLET BY MOUTH ONCE DAILY AT 6PM, Disp: 90 tablet, Rfl: 4   Cetirizine HCl (ZYRTEC PO), Take by mouth daily., Disp: , Rfl:    Fish Oil OIL, 1 capsule by Does not apply route daily., Disp: , Rfl:    fluticasone (FLONASE) 50 MCG/ACT nasal spray, Place 2 sprays into both nostrils daily., Disp: , Rfl:    lisinopril  (ZESTRIL) 40 MG tablet, Take 1 tablet (40 mg total) by mouth daily., Disp: 90 tablet, Rfl: 4   metoprolol succinate (TOPROL-XL) 50 MG 24 hr tablet, Take 1 tablet (50 mg total) by mouth daily. Take with or immediately following a meal., Disp: 90 tablet, Rfl: 4   Multiple Vitamin (MULTIVITAMIN) capsule, Take 1 capsule by mouth daily., Disp: , Rfl:    pyridoxine (B-6) 100 MG tablet, Take 100 mg by mouth daily., Disp: , Rfl:    SHINGRIX injection, INJECT 0.5 ML INTO THE MUSCLE ONCE FOR 1 DOSE (DOSE NUMBER 1), Disp: 1 each,  Rfl: 0   tamsulosin (FLOMAX) 0.4 MG CAPS capsule, Take 1 capsule (0.4 mg total) by mouth daily., Disp: 90 capsule, Rfl: 3   VITAMIN D PO, Take by mouth 2 (two) times daily., Disp: , Rfl:     Assessment & Plan:  PHYSICAL :  Physical Wnl will check CMP, FLP, CBC,TSH, PSA.  Problem List Items Addressed This Visit       Other   Prediabetes   Relevant Orders   Bayer DCA Hb A1c Waived (STAT)   Other Visit Diagnoses     Annual physical exam    -  Primary   Relevant Orders   TSH   PSA   Lipid panel   CBC with Differential/Platelet   Comprehensive metabolic panel   Urinalysis, Routine w reflex microscopic        Orders Placed This Encounter  Procedures   TSH   PSA   Lipid panel   CBC with Differential/Platelet   Comprehensive metabolic panel   Urinalysis, Routine w reflex microscopic   Bayer DCA Hb A1c Waived (STAT)     No orders of the defined types were placed in this encounter.    Follow up plan: No follow-ups on file.

## 2021-09-03 LAB — COMPREHENSIVE METABOLIC PANEL
ALT: 32 IU/L (ref 0–44)
AST: 21 IU/L (ref 0–40)
Albumin/Globulin Ratio: 1.5 (ref 1.2–2.2)
Albumin: 4.4 g/dL (ref 3.8–4.9)
Alkaline Phosphatase: 101 IU/L (ref 44–121)
BUN/Creatinine Ratio: 13 (ref 10–24)
BUN: 15 mg/dL (ref 8–27)
Bilirubin Total: 0.3 mg/dL (ref 0.0–1.2)
CO2: 23 mmol/L (ref 20–29)
Calcium: 9.2 mg/dL (ref 8.6–10.2)
Chloride: 103 mmol/L (ref 96–106)
Creatinine, Ser: 1.19 mg/dL (ref 0.76–1.27)
Globulin, Total: 2.9 g/dL (ref 1.5–4.5)
Glucose: 99 mg/dL (ref 70–99)
Potassium: 4.3 mmol/L (ref 3.5–5.2)
Sodium: 141 mmol/L (ref 134–144)
Total Protein: 7.3 g/dL (ref 6.0–8.5)
eGFR: 70 mL/min/{1.73_m2} (ref >59)

## 2021-09-03 LAB — THYROID PANEL WITH TSH
Free Thyroxine Index: 2.4 (ref 1.2–4.9)
T3 Uptake Ratio: 29 % (ref 24–39)
T4, Total: 8.4 ug/dL (ref 4.5–12.0)
TSH: 0.915 u[IU]/mL (ref 0.450–4.500)

## 2021-09-03 LAB — CBC WITH DIFFERENTIAL/PLATELET
Basophils Absolute: 0 10*3/uL (ref 0.0–0.2)
Basos: 1 %
EOS (ABSOLUTE): 0.2 10*3/uL (ref 0.0–0.4)
Eos: 2 %
Hematocrit: 48.5 % (ref 37.5–51.0)
Hemoglobin: 16.9 g/dL (ref 13.0–17.7)
Immature Grans (Abs): 0 10*3/uL (ref 0.0–0.1)
Immature Granulocytes: 0 %
Lymphocytes Absolute: 1.8 10*3/uL (ref 0.7–3.1)
Lymphs: 24 %
MCH: 31.2 pg (ref 26.6–33.0)
MCHC: 34.8 g/dL (ref 31.5–35.7)
MCV: 90 fL (ref 79–97)
Monocytes Absolute: 0.6 10*3/uL (ref 0.1–0.9)
Monocytes: 8 %
Neutrophils Absolute: 4.9 10*3/uL (ref 1.4–7.0)
Neutrophils: 65 %
Platelets: 239 10*3/uL (ref 150–450)
RBC: 5.42 x10E6/uL (ref 4.14–5.80)
RDW: 12.9 % (ref 11.6–15.4)
WBC: 7.6 10*3/uL (ref 3.4–10.8)

## 2021-09-03 LAB — PSA TOTAL+% FREE (SERIAL)
PSA, Free Pct: 25.5 %
PSA, Free: 0.28 ng/mL
Prostate Specific Ag, Serum: 1.1 ng/mL (ref 0.0–4.0)

## 2021-09-03 LAB — LIPID PANEL
Chol/HDL Ratio: 2.8 ratio (ref 0.0–5.0)
Cholesterol, Total: 162 mg/dL (ref 100–199)
HDL: 57 mg/dL (ref >39)
LDL Chol Calc (NIH): 87 mg/dL (ref 0–99)
Triglycerides: 101 mg/dL (ref 0–149)
VLDL Cholesterol Cal: 18 mg/dL (ref 5–40)

## 2021-09-08 DIAGNOSIS — M9905 Segmental and somatic dysfunction of pelvic region: Secondary | ICD-10-CM | POA: Diagnosis not present

## 2021-09-08 DIAGNOSIS — M5431 Sciatica, right side: Secondary | ICD-10-CM | POA: Diagnosis not present

## 2021-09-08 DIAGNOSIS — M9903 Segmental and somatic dysfunction of lumbar region: Secondary | ICD-10-CM | POA: Diagnosis not present

## 2021-09-08 DIAGNOSIS — M5416 Radiculopathy, lumbar region: Secondary | ICD-10-CM | POA: Diagnosis not present

## 2021-09-18 ENCOUNTER — Ambulatory Visit: Payer: BC Managed Care – PPO | Admitting: Internal Medicine

## 2021-09-18 ENCOUNTER — Encounter: Payer: Self-pay | Admitting: Internal Medicine

## 2021-09-18 ENCOUNTER — Other Ambulatory Visit: Payer: Self-pay

## 2021-09-18 ENCOUNTER — Ambulatory Visit (INDEPENDENT_AMBULATORY_CARE_PROVIDER_SITE_OTHER): Payer: BC Managed Care – PPO | Admitting: Internal Medicine

## 2021-09-18 VITALS — BP 130/70 | HR 71 | Temp 98.6°F | Ht 67.32 in | Wt 214.6 lb

## 2021-09-18 DIAGNOSIS — I10 Essential (primary) hypertension: Secondary | ICD-10-CM | POA: Diagnosis not present

## 2021-09-18 DIAGNOSIS — Z23 Encounter for immunization: Secondary | ICD-10-CM | POA: Diagnosis not present

## 2021-09-18 DIAGNOSIS — R7303 Prediabetes: Secondary | ICD-10-CM

## 2021-09-18 NOTE — Progress Notes (Signed)
BP 130/70   Pulse 71   Temp 98.6 F (37 C)   Ht 5' 7.32" (1.71 m)   Wt 214 lb 9.6 oz (97.3 kg)   SpO2 98%   BMI 33.29 kg/m    Subjective:    Patient ID: Craig Ryan, male    DOB: 1961/11/04, 60 y.o.   MRN: [id number]  Chief Complaint  Patient presents with   Establish Care   Lab work review    HPI: Craig Ryan is a 60 y.o. male  Had passed a kidney stone x 24-Jan-2023 night wasn't excruciating , has had this in the past too and he didn't actually see the stone.  Had increased frequency of urination. Passed by the evening some sensitivity noted when he peeded. Got better now per pt. Has had lithotripsy sees urology@ [facility] nov 2020.   Urinary Frequency  This is a recurrent problem. The current episode started more than 1 year ago. The quality of the pain is described as burning. The pain is at a severity of 5/10. There has been no fever. Associated symptoms include frequency. Pertinent negatives include no chills, discharge, flank pain, hematuria, sweats, urgency or vomiting.   Chief Complaint  Patient presents with   Establish Care   Lab work review    Relevant past medical, surgical, family and social history reviewed and updated as indicated. Interim medical history since our last visit reviewed. Allergies and medications reviewed and updated.  Review of Systems  Constitutional:  Negative for chills.  Gastrointestinal:  Negative for vomiting.  Genitourinary:  Positive for frequency. Negative for flank pain, hematuria and urgency.   Per HPI unless specifically indicated above     Objective:    BP 130/70   Pulse 71   Temp 98.6 F (37 C)   Ht 5' 7.32" (1.71 m)   Wt 214 lb 9.6 oz (97.3 kg)   SpO2 98%   BMI 33.29 kg/m   Wt Readings from Last 3 Encounters:  09/18/21 214 lb 9.6 oz (97.3 kg)  09/02/21 213 lb 9.6 oz (96.9 kg)  04/22/21 213 lb (96.6 kg)    Physical Exam Constitutional:      General: He is not in acute distress.    Appearance: He is  ill-appearing. He is not toxic-appearing or diaphoretic.  HENT:     Head: Normocephalic and atraumatic.     Nose: No congestion or rhinorrhea.  Neurological:     Mental Status: He is alert.  Psychiatric:        Mood and Affect: Mood normal.        Behavior: Behavior normal.        Thought Content: Thought content normal.        Judgment: Judgment normal.    Results for orders placed or performed in visit on 09/02/21  Urinalysis, Routine w reflex microscopic  Result Value Ref Range   Specific Gravity, UA 1.020 1.005 - 1.030   pH, UA 7.0 5.0 - 7.5   Color, UA Yellow Yellow   Appearance Ur Clear Clear   Leukocytes,UA Negative Negative   Protein,UA Trace (A) Negative/Trace   Glucose, UA Negative Negative   Ketones, UA Negative Negative   RBC, UA Negative Negative   Bilirubin, UA Negative Negative   Urobilinogen, Ur 0.2 0.2 - 1.0 mg/dL   Nitrite, UA Negative Negative  Bayer DCA Hb A1c Waived (STAT)  Result Value Ref Range   HB A1C (BAYER DCA - WAIVED) 5.9 (H) 4.8 - 5.6 %  Thyroid Panel With TSH  Result Value Ref Range   TSH 0.915 0.450 - 4.500 uIU/mL   T4, Total 8.4 4.5 - 12.0 ug/dL   T3 Uptake Ratio 29 24 - 39 %   Free Thyroxine Index 2.4 1.2 - 4.9  PSA Total+%Free (Serial)  Result Value Ref Range   Prostate Specific Ag, Serum 1.1 0.0 - 4.0 ng/mL   PSA, Free 0.28 N/A ng/mL   PSA, Free Pct 25.5 %  Lipid panel  Result Value Ref Range   Cholesterol, Total 162 100 - 199 mg/dL   Triglycerides 101 0 - 149 mg/dL   HDL 57 >39 mg/dL   VLDL Cholesterol Cal 18 5 - 40 mg/dL   LDL Chol Calc (NIH) 87 0 - 99 mg/dL   Chol/HDL Ratio 2.8 0.0 - 5.0 ratio  CBC with Differential/Platelet  Result Value Ref Range   WBC 7.6 3.4 - 10.8 x10E3/uL   RBC 5.42 4.14 - 5.80 x10E6/uL   Hemoglobin 16.9 13.0 - 17.7 g/dL   Hematocrit 48.5 37.5 - 51.0 %   MCV 90 79 - 97 fL   MCH 31.2 26.6 - 33.0 pg   MCHC 34.8 31.5 - 35.7 g/dL   RDW 12.9 11.6 - 15.4 %   Platelets 239 150 - 450 x10E3/uL    Neutrophils 65 Not Estab. %   Lymphs 24 Not Estab. %   Monocytes 8 Not Estab. %   Eos 2 Not Estab. %   Basos 1 Not Estab. %   Neutrophils Absolute 4.9 1.4 - 7.0 x10E3/uL   Lymphocytes Absolute 1.8 0.7 - 3.1 x10E3/uL   Monocytes Absolute 0.6 0.1 - 0.9 x10E3/uL   EOS (ABSOLUTE) 0.2 0.0 - 0.4 x10E3/uL   Basophils Absolute 0.0 0.0 - 0.2 x10E3/uL   Immature Granulocytes 0 Not Estab. %   Immature Grans (Abs) 0.0 0.0 - 0.1 x10E3/uL  Comprehensive metabolic panel  Result Value Ref Range   Glucose 99 70 - 99 mg/dL   BUN 15 8 - 27 mg/dL   Creatinine, Ser 1.19 0.76 - 1.27 mg/dL   eGFR 70 >59 mL/min/1.73   BUN/Creatinine Ratio 13 10 - 24   Sodium 141 134 - 144 mmol/L   Potassium 4.3 3.5 - 5.2 mmol/L   Chloride 103 96 - 106 mmol/L   CO2 23 20 - 29 mmol/L   Calcium 9.2 8.6 - 10.2 mg/dL   Total Protein 7.3 6.0 - 8.5 g/dL   Albumin 4.4 3.8 - 4.9 g/dL   Globulin, Total 2.9 1.5 - 4.5 g/dL   Albumin/Globulin Ratio 1.5 1.2 - 2.2   Bilirubin Total 0.3 0.0 - 1.2 mg/dL   Alkaline Phosphatase 101 44 - 121 IU/L   AST 21 0 - 40 IU/L   ALT 32 0 - 44 IU/L        Current Outpatient Medications:    amLODipine (NORVASC) 5 MG tablet, Take 1 tablet (5 mg total) by mouth daily., Disp: 90 tablet, Rfl: 4   aspirin EC 81 MG tablet, Take 81 mg by mouth daily., Disp: , Rfl:    atorvastatin (LIPITOR) 40 MG tablet, TAKE 1 TABLET BY MOUTH ONCE DAILY AT 6PM, Disp: 90 tablet, Rfl: 4   Cetirizine HCl (ZYRTEC PO), Take by mouth daily., Disp: , Rfl:    fexofenadine (ALLEGRA ALLERGY) 180 MG tablet, Take 1 tablet (180 mg total) by mouth daily., Disp: 10 tablet, Rfl: 1   Fish Oil OIL, 1 capsule by Does not apply route daily., Disp: , Rfl:  fluticasone (FLONASE) 50 MCG/ACT nasal spray, Place 2 sprays into both nostrils daily., Disp: , Rfl:    lisinopril (ZESTRIL) 40 MG tablet, Take 1 tablet (40 mg total) by mouth daily., Disp: 90 tablet, Rfl: 4   metoprolol succinate (TOPROL-XL) 50 MG 24 hr tablet, Take 1 tablet (50 mg  total) by mouth daily. Take with or immediately following a meal., Disp: 90 tablet, Rfl: 4   Multiple Vitamin (MULTIVITAMIN) capsule, Take 1 capsule by mouth daily., Disp: , Rfl:    pyridoxine (B-6) 100 MG tablet, Take 100 mg by mouth daily., Disp: , Rfl:    SHINGRIX injection, INJECT 0.5 ML INTO THE MUSCLE ONCE FOR 1 DOSE (DOSE NUMBER 1), Disp: 1 each, Rfl: 0   tamsulosin (FLOMAX) 0.4 MG CAPS capsule, Take 1 capsule (0.4 mg total) by mouth daily., Disp: 90 capsule, Rfl: 3   VITAMIN D PO, Take by mouth 2 (two) times daily., Disp: , Rfl:     Assessment & Plan:  Nephrolithiasis : will need to fu with urology  Continue flomax for this and BPH.   2. HLD  [place]  recheck FLP, check LFT's work on diet, SE of meds explained to pt. low fat and high fiber diet explained to pt.   3. prediabetes  Lifestyle modifications advised to pt. A1c at 5.9   Portion control and avoiding high carb low fat diet advised.  Diet plan given to pt   exercise plan given and encouraged.  To increase exercise to 150 mins a week ie 21/2 hours a week. Pt verbalises understanding of the above.    Problem List Items Addressed This Visit   None Visit Diagnoses     Need for influenza vaccination    -  Primary   Relevant Orders   Flu Vaccine QUAD 38moIM (Fluarix, Fluzone & Alfiuria Quad PF)        Orders Placed This Encounter  Procedures   Flu Vaccine QUAD 668moM (Fluarix, Fluzone & Alfiuria Quad PF)     No orders of the defined types were placed in this encounter.    Follow up plan: No follow-ups on file.   This visit was completed via video visit through MyChart due to the restrictions of the COVID-19 pandemic. All issues as above were discussed and addressed. Physical exam was done as above through visual confirmation on video through MyChart. If it was felt that the patient should be evaluated in the office, they were directed there. The patient verbally consented to this visit. Location of the patient:  home Location of the provider: work Those involved with this call:  Provider: AvCharlynne CousinsMD CMA: TaFrazier [email or web address]/Registration: AlFirstEnergy CorpTime spent on call:  10 minutes with patient face to face via video conference. More than 50% of this time was spent in counseling and coordination of care. 10 minutes total spent in review of patient's record and preparation of their chart.

## 2021-09-25 ENCOUNTER — Other Ambulatory Visit: Payer: Self-pay | Admitting: Nurse Practitioner

## 2021-09-25 DIAGNOSIS — I10 Essential (primary) hypertension: Secondary | ICD-10-CM

## 2021-09-25 DIAGNOSIS — E782 Mixed hyperlipidemia: Secondary | ICD-10-CM

## 2021-09-25 NOTE — Telephone Encounter (Signed)
Requested Prescriptions  Pending Prescriptions Disp Refills  . atorvastatin (LIPITOR) 40 MG tablet [Pharmacy Med Name: ATORVASTATIN TABS 40MG ] 90 tablet 3    Sig: TAKE 1 TABLET DAILY AT 6 P.M.     Cardiovascular:  Antilipid - Statins Passed - 09/25/2021  6:32 AM      Passed - Total Cholesterol in normal range and within 360 days    Cholesterol, Total  Date Value Ref Range Status  09/02/2021 162 100 - 199 mg/dL Final   Cholesterol Piccolo, Waived  Date Value Ref Range Status  11/29/2019 151 <200 mg/dL Final    Comment:                            Desirable                <200                         Borderline High      200- 239                         High                     >239          Passed - LDL in normal range and within 360 days    LDL Chol Calc (NIH)  Date Value Ref Range Status  09/02/2021 87 0 - 99 mg/dL Final         Passed - HDL in normal range and within 360 days    HDL  Date Value Ref Range Status  09/02/2021 57 >39 mg/dL Final         Passed - Triglycerides in normal range and within 360 days    Triglycerides  Date Value Ref Range Status  09/02/2021 101 0 - 149 mg/dL Final   Triglycerides Piccolo,Waived  Date Value Ref Range Status  11/29/2019 128 <150 mg/dL Final    Comment:                            Normal                   <150                         Borderline High     150 - 199                         High                200 - 499                         Very High                >499          Passed - Patient is not pregnant      Passed - Valid encounter within last 12 months    Recent Outpatient Visits          1 week ago Need for influenza vaccination   [place] Vigg, Avanti, MD   3 weeks ago Annual physical exam   [facility] Vigg, Avanti, MD   6  months ago Screening for colon cancer   [place] Mc[place]e, Lauren A, NP   1 year ago PE (physical exam), annual   [place], Barbaraann Faster, NP   1 year ago Lab test positive for detection of COVID-19 virus   [place], Barbaraann Faster, NP      Future Appointments            In 5 months Vigg, Avanti, MD [facility]           . amLODipine ([place]) 5 MG tablet [Pharmacy Med Name: AMLODIPINE BESYLATE TABS 5MG ] 90 tablet 1    Sig: TAKE 1 TABLET DAILY     Cardiovascular:  Calcium Channel Blockers Passed - 09/25/2021  6:32 AM      Passed - Last BP in normal range    BP Readings from Last 1 Encounters:  09/18/21 130/70         Passed - Valid encounter within last 6 months    Recent Outpatient Visits          1 week ago Need for influenza vaccination   [facility] Vigg, Avanti, MD   3 weeks ago Annual physical exam   [facility] Vigg, Avanti, MD   6 months ago Screening for colon cancer   [place], Lauren A, NP   1 year ago PE (physical exam), annual   [place], Barbaraann Faster, NP   1 year ago Lab test positive for detection of COVID-19 virus   [place], Barbaraann Faster, NP      Future Appointments            In 5 months Vigg, Avanti, MD [facility]           . metoprolol succinate (TOPROL-XL) 50 MG 24 hr tablet [Pharmacy Med Name: METOPROLOL SUCCINATE ER TABS 50MG ] 90 tablet 1    Sig: TAKE 1 TABLET DAILY, TAKE WITH OR IMMEDIATELY FOLLOWING A MEAL     Cardiovascular:  Beta Blockers Passed - 09/25/2021  6:32 AM      Passed - Last BP in normal range    BP Readings from Last 1 Encounters:  09/18/21 130/70         Passed - Last Heart Rate in normal range    Pulse Readings from Last 1 Encounters:  09/18/21 71         Passed - Valid encounter within last 6 months    Recent Outpatient Visits          1 week ago Need for influenza vaccination   [place] Vigg, Avanti, MD   3 weeks ago Annual physical exam   [place]  Vigg, Avanti, MD   6 months ago Screening for colon cancer   [place] McElwee, Lauren A, NP   1 year ago PE (physical exam), annual   [place] Cann[place], Barbaraann Faster, NP   1 year ago Lab test positive for detection of COVID-19 virus   [place], Barbaraann Faster, NP      Future Appointments            In 5 months Vigg, Avanti, MD [facility]           . lisinopril (ZESTRIL) 40 MG tablet [Pharmacy Med Name: LISINOPRIL TABS 40MG ] 90 tablet 1    Sig: TAKE 1 TABLET DAILY  Cardiovascular:  ACE Inhibitors Passed - 09/25/2021  6:32 AM      Passed - Cr in normal range and within 180 days    Creatinine, Ser  Date Value Ref Range Status  09/02/2021 1.19 0.76 - 1.27 mg/dL Final         Passed - K in normal range and within 180 days    Potassium  Date Value Ref Range Status  09/02/2021 4.3 3.5 - 5.2 mmol/L Final         Passed - Patient is not pregnant      Passed - Last BP in normal range    BP Readings from Last 1 Encounters:  09/18/21 130/70         Passed - Valid encounter within last 6 months    Recent Outpatient Visits          1 week ago Need for influenza vaccination   [facility] Vigg, Avanti, MD   3 weeks ago Annual physical exam   [facility] Vigg, Avanti, MD   6 months ago Screening for colon cancer   [place], Lauren A, NP   1 year ago PE (physical exam), annual   [place] Cannady, Barbaraann Faster, NP   1 year ago Lab test positive for detection of COVID-19 virus   [place], Barbaraann Faster, NP      Future Appointments            In 5 months Vigg, Avanti, MD [facility]

## 2021-10-13 DIAGNOSIS — M5416 Radiculopathy, lumbar region: Secondary | ICD-10-CM | POA: Diagnosis not present

## 2021-10-13 DIAGNOSIS — M9905 Segmental and somatic dysfunction of pelvic region: Secondary | ICD-10-CM | POA: Diagnosis not present

## 2021-10-13 DIAGNOSIS — M5431 Sciatica, right side: Secondary | ICD-10-CM | POA: Diagnosis not present

## 2021-10-13 DIAGNOSIS — M9903 Segmental and somatic dysfunction of lumbar region: Secondary | ICD-10-CM | POA: Diagnosis not present

## 2021-11-17 DIAGNOSIS — M5416 Radiculopathy, lumbar region: Secondary | ICD-10-CM | POA: Diagnosis not present

## 2021-11-17 DIAGNOSIS — M5431 Sciatica, right side: Secondary | ICD-10-CM | POA: Diagnosis not present

## 2021-11-17 DIAGNOSIS — M9905 Segmental and somatic dysfunction of pelvic region: Secondary | ICD-10-CM | POA: Diagnosis not present

## 2021-11-17 DIAGNOSIS — M9903 Segmental and somatic dysfunction of lumbar region: Secondary | ICD-10-CM | POA: Diagnosis not present

## 2021-12-11 ENCOUNTER — Encounter: Payer: Self-pay | Admitting: Nurse Practitioner

## 2021-12-11 ENCOUNTER — Ambulatory Visit: Payer: BC Managed Care – PPO | Admitting: Nurse Practitioner

## 2021-12-11 ENCOUNTER — Other Ambulatory Visit: Payer: Self-pay

## 2021-12-11 VITALS — BP 142/84 | HR 86 | Temp 98.5°F | Resp 18 | Ht 67.32 in | Wt 212.4 lb

## 2021-12-11 DIAGNOSIS — J0100 Acute maxillary sinusitis, unspecified: Secondary | ICD-10-CM | POA: Diagnosis not present

## 2021-12-11 DIAGNOSIS — J3489 Other specified disorders of nose and nasal sinuses: Secondary | ICD-10-CM | POA: Diagnosis not present

## 2021-12-11 LAB — VERITOR FLU A/B WAIVED
Influenza A: NEGATIVE
Influenza B: NEGATIVE

## 2021-12-11 MED ORDER — PREDNISONE 20 MG PO TABS: 40.0000 mg | ORAL_TABLET | Freq: Every day | ORAL | 0 refills | Status: DC

## 2021-12-11 MED ORDER — HYDROCOD POLI-CHLORPHE POLI ER 10-8 MG/5ML PO SUER: 5.0000 mL | Freq: Two times a day (BID) | ORAL | 0 refills | Status: DC | PRN

## 2021-12-11 MED ORDER — PREDNISONE 20 MG PO TABS: 40.0000 mg | ORAL_TABLET | Freq: Every day | ORAL | 0 refills | Status: AC

## 2021-12-11 NOTE — Assessment & Plan Note (Signed)
Acute x 4 days with negative flu testing today.  Covid testing obtained in office today -- self quarantine until results return.  Suspect more viral at this time, discussed with patient.  No abx at this time, if gets to day 7 and not feeling better will send in abx.  At this time will treat with Prednisone 40 MG x 5 days and Tussionex + recommend he continue Zyrtec and Flonase daily + use Coricidin as needed, avoid other OTC cold medications due to BP elevation today.  Recommend: - Increased rest - Increasing Fluids - Acetaminophen as needed for fever/pain.  - Salt water gargling, chloraseptic spray and throat lozenges - Mucinex or Coridicin - Humidifying the air Return to office as needed for worsening or ongoing.

## 2021-12-11 NOTE — Patient Instructions (Signed)

## 2021-12-11 NOTE — Addendum Note (Signed)
Addended by: Marnee Guarneri T on: 12/11/2021 09:56 AM   Modules accepted: Orders

## 2021-12-11 NOTE — Progress Notes (Signed)
BP (!) 142/84 (BP Location: Left Arm, Patient Position: Sitting, Cuff Size: Normal)    Pulse 86    Temp 98.5 F (36.9 C) (Oral)    Resp 18    Ht 5' 7.32" (1.71 m)    Wt 212 lb 6.4 oz (96.3 kg)    SpO2 97%    BMI 32.95 kg/m    Subjective:    Patient ID: Craig Ryan, male    DOB: 03/07/1961, 61 y.o.   MRN: [id number]  HPI: Craig Ryan is a 61 y.o. male  Chief Complaint  Patient presents with   Sinus Problem    Patient states he became symptomatic about 3 days ago. Patient states he had a co-worker that has not been feeling well and he states his wife was here just 2 days and seen by [facility] for similar symptoms. Patient states he alternating between Tylenol and Advil. Patient states he has taken a little bit of NyQuil to help as he didn't have Coricidin. Patient states he mainly feels the pressure on the right side of his head and he thinks it may have something to do with his ears.    Head Congestion   Cough   Nasal Congestion   UPPER RESPIRATORY TRACT INFECTION Started with pressure in head 4 days ago.  Not Covid vaccinated, but is flu vaccinate.  Wife had Covid testing and was negative -- has sinus infection.  Has been around sick people at work.   Worst symptom: Pressure and rhinorrhea Fever: no Cough:  a little bit Shortness of breath: no Wheezing: no Chest pain: no Chest tightness: no Chest congestion: no Nasal congestion: yes Runny nose: yes Post nasal drip: yes Sneezing: no Sore throat:  very mild from drainage Swollen glands: no Sinus pressure: yes Headache: no Face pain: no Toothache: no Ear pain: none Ear pressure: yes "right Eyes red/itching:no Eye drainage/crusting: yes  Vomiting: no Rash: no Fatigue: yes Sick contacts: yes Strep contacts: no  Context: stable Recurrent sinusitis: no Relief with OTC cold/cough medications: yes  Treatments attempted: Nyquil and alternating Tylenol and Ibuprofen -- he is going to pick up Coricidin    Relevant  past medical, surgical, family and social history reviewed and updated as indicated. Interim medical history since our last visit reviewed. Allergies and medications reviewed and updated.  Review of Systems  Constitutional:  Positive for fatigue. Negative for activity change, appetite change, chills and fever.  HENT:  Positive for congestion, postnasal drip, rhinorrhea, sinus pressure and sore throat. Negative for ear discharge, ear pain, sinus pain and sneezing.   Respiratory:  Positive for cough. Negative for chest tightness, shortness of breath and wheezing.   Cardiovascular: Negative.   Gastrointestinal: Negative.   Neurological: Negative.   Psychiatric/Behavioral: Negative.     Per HPI unless specifically indicated above     Objective:    BP (!) 142/84 (BP Location: Left Arm, Patient Position: Sitting, Cuff Size: Normal)    Pulse 86    Temp 98.5 F (36.9 C) (Oral)    Resp 18    Ht 5' 7.32" (1.71 m)    Wt 212 lb 6.4 oz (96.3 kg)    SpO2 97%    BMI 32.95 kg/m   Wt Readings from Last 3 Encounters:  12/11/21 212 lb 6.4 oz (96.3 kg)  09/18/21 214 lb 9.6 oz (97.3 kg)  09/02/21 213 lb 9.6 oz (96.9 kg)    Physical Exam Vitals and nursing note reviewed.  Constitutional:  General: He is awake. He is not in acute distress.    Appearance: He is well-developed and well-groomed. He is obese. He is not ill-appearing or toxic-appearing.  HENT:     Head: Normocephalic and atraumatic.     Right Ear: Hearing, ear canal and external ear normal. No drainage. A middle ear effusion is present. Tympanic membrane is not injected.     Left Ear: Hearing, ear canal and external ear normal. No drainage. A middle ear effusion is present. Tympanic membrane is not injected.     Nose: Rhinorrhea present. Rhinorrhea is clear.     Right Turbinates: Enlarged.     Left Turbinates: Enlarged.     Right Sinus: No maxillary sinus tenderness or frontal sinus tenderness.     Left Sinus: No maxillary sinus  tenderness or frontal sinus tenderness.     Mouth/Throat:     Mouth: Mucous membranes are moist.     Pharynx: Uvula midline. Posterior oropharyngeal erythema (mild with cobblestoning) present. No pharyngeal swelling or oropharyngeal exudate.  Eyes:     General: Lids are normal.        Right eye: No discharge.        Left eye: No discharge.     Conjunctiva/sclera:     Right eye: Right conjunctiva is injected.     Left eye: Left conjunctiva is injected.     Pupils: Pupils are equal, round, and reactive to light.  Neck:     Thyroid: No thyromegaly.     Vascular: No carotid bruit or JVD.     Trachea: Trachea normal.  Cardiovascular:     Rate and Rhythm: Normal rate and regular rhythm.     Heart sounds: Normal heart sounds, S1 normal and S2 normal. No murmur heard.   No gallop.  Pulmonary:     Effort: Pulmonary effort is normal. No accessory muscle usage or respiratory distress.     Breath sounds: Normal breath sounds.  Abdominal:     General: Bowel sounds are normal.     Palpations: Abdomen is soft.  Musculoskeletal:        General: Normal range of motion.     Cervical back: Normal range of motion and neck supple.     Right lower leg: No edema.     Left lower leg: No edema.  Lymphadenopathy:     Head:     Right side of head: No submental, submandibular, tonsillar, preauricular or posterior auricular adenopathy.     Left side of head: No submental, submandibular, tonsillar, preauricular or posterior auricular adenopathy.  Skin:    General: Skin is warm and dry.     Capillary Refill: Capillary refill takes less than 2 seconds.     Findings: No rash.  Neurological:     Mental Status: He is alert and oriented to person, place, and time.     Deep Tendon Reflexes: Reflexes are normal and symmetric.  Psychiatric:        Attention and Perception: Attention normal.        Mood and Affect: Mood normal.        Speech: Speech normal.        Behavior: Behavior normal. Behavior is  cooperative.        Thought Content: Thought content normal.    Results for orders placed or performed in visit on 12/11/21  Veritor Flu A/B Waived  Result Value Ref Range   Influenza A Negative Negative   Influenza B Negative Negative  Assessment & Plan:   Problem List Items Addressed This Visit       Other   Sinus pressure - Primary    Acute x 4 days with negative flu testing today.  Covid testing obtained in office today -- self quarantine until results return.  Suspect more viral at this time, discussed with patient.  No abx at this time, if gets to day 7 and not feeling better will send in abx.  At this time will treat with Prednisone 40 MG x 5 days and Tussionex + recommend he continue Zyrtec and Flonase daily + use Coricidin as needed, avoid other OTC cold medications due to BP elevation today.  Recommend: - Increased rest - Increasing Fluids - Acetaminophen as needed for fever/pain.  - Salt water gargling, chloraseptic spray and throat lozenges - Mucinex or Coridicin - Humidifying the air Return to office as needed for worsening or ongoing.      Relevant Orders   Veritor Flu A/B Waived (Completed)   Novel Coronavirus, NAA ([facility])     Follow up plan: Return if symptoms worsen or fail to improve.

## 2021-12-13 LAB — NOVEL CORONAVIRUS, NAA: SARS-CoV-2, NAA: NOT DETECTED

## 2021-12-13 LAB — SARS-COV-2, NAA 2 DAY TAT

## 2021-12-13 NOTE — Progress Notes (Signed)
Contacted via MyChart   Good morning Craig Ryan, your Covid testing is negative:)

## 2021-12-14 DIAGNOSIS — M9903 Segmental and somatic dysfunction of lumbar region: Secondary | ICD-10-CM | POA: Diagnosis not present

## 2021-12-14 DIAGNOSIS — M5431 Sciatica, right side: Secondary | ICD-10-CM | POA: Diagnosis not present

## 2021-12-14 DIAGNOSIS — M9905 Segmental and somatic dysfunction of pelvic region: Secondary | ICD-10-CM | POA: Diagnosis not present

## 2021-12-14 DIAGNOSIS — M5416 Radiculopathy, lumbar region: Secondary | ICD-10-CM | POA: Diagnosis not present

## 2021-12-15 ENCOUNTER — Encounter: Payer: Self-pay | Admitting: Nurse Practitioner

## 2021-12-15 MED ORDER — AMOXICILLIN-POT CLAVULANATE 875-125 MG PO TABS: 1.0000 | ORAL_TABLET | Freq: Two times a day (BID) | ORAL | 0 refills | Status: AC

## 2021-12-15 MED ORDER — AMOXICILLIN-POT CLAVULANATE 875-125 MG PO TABS: 1.0000 | ORAL_TABLET | Freq: Two times a day (BID) | ORAL | 0 refills | Status: DC

## 2022-01-11 DIAGNOSIS — M9903 Segmental and somatic dysfunction of lumbar region: Secondary | ICD-10-CM | POA: Diagnosis not present

## 2022-01-11 DIAGNOSIS — M9905 Segmental and somatic dysfunction of pelvic region: Secondary | ICD-10-CM | POA: Diagnosis not present

## 2022-01-11 DIAGNOSIS — M5431 Sciatica, right side: Secondary | ICD-10-CM | POA: Diagnosis not present

## 2022-01-11 DIAGNOSIS — M5416 Radiculopathy, lumbar region: Secondary | ICD-10-CM | POA: Diagnosis not present

## 2022-02-08 DIAGNOSIS — M9905 Segmental and somatic dysfunction of pelvic region: Secondary | ICD-10-CM | POA: Diagnosis not present

## 2022-02-08 DIAGNOSIS — M9903 Segmental and somatic dysfunction of lumbar region: Secondary | ICD-10-CM | POA: Diagnosis not present

## 2022-02-08 DIAGNOSIS — M5431 Sciatica, right side: Secondary | ICD-10-CM | POA: Diagnosis not present

## 2022-02-08 DIAGNOSIS — M5416 Radiculopathy, lumbar region: Secondary | ICD-10-CM | POA: Diagnosis not present

## 2022-03-11 DIAGNOSIS — Z83518 Family history of other specified eye disorder: Secondary | ICD-10-CM | POA: Diagnosis not present

## 2022-03-11 DIAGNOSIS — H2513 Age-related nuclear cataract, bilateral: Secondary | ICD-10-CM | POA: Diagnosis not present

## 2022-03-11 DIAGNOSIS — H35073 Retinal telangiectasis, bilateral: Secondary | ICD-10-CM | POA: Diagnosis not present

## 2022-03-15 DIAGNOSIS — M9905 Segmental and somatic dysfunction of pelvic region: Secondary | ICD-10-CM | POA: Diagnosis not present

## 2022-03-15 DIAGNOSIS — M5431 Sciatica, right side: Secondary | ICD-10-CM | POA: Diagnosis not present

## 2022-03-15 DIAGNOSIS — M5416 Radiculopathy, lumbar region: Secondary | ICD-10-CM | POA: Diagnosis not present

## 2022-03-15 DIAGNOSIS — M9903 Segmental and somatic dysfunction of lumbar region: Secondary | ICD-10-CM | POA: Diagnosis not present

## 2022-03-18 ENCOUNTER — Encounter: Payer: Self-pay | Admitting: Internal Medicine

## 2022-03-18 ENCOUNTER — Ambulatory Visit: Payer: BC Managed Care – PPO | Admitting: Internal Medicine

## 2022-03-18 VITALS — BP 137/86 | HR 67 | Temp 98.4°F | Ht 67.32 in | Wt 214.0 lb

## 2022-03-18 DIAGNOSIS — I10 Essential (primary) hypertension: Secondary | ICD-10-CM | POA: Diagnosis not present

## 2022-03-18 DIAGNOSIS — E785 Hyperlipidemia, unspecified: Secondary | ICD-10-CM | POA: Diagnosis not present

## 2022-03-18 LAB — URINALYSIS, ROUTINE W REFLEX MICROSCOPIC
Bilirubin, UA: NEGATIVE
Glucose, UA: NEGATIVE
Ketones, UA: NEGATIVE
Leukocytes,UA: NEGATIVE
Nitrite, UA: NEGATIVE
RBC, UA: NEGATIVE
Specific Gravity, UA: 1.025 (ref 1.005–1.030)
Urobilinogen, Ur: 0.2 mg/dL (ref 0.2–1.0)
pH, UA: 6 (ref 5.0–7.5)

## 2022-03-18 NOTE — Progress Notes (Signed)
? ?BP 137/86   Pulse 67   Temp 98.4 ?F (36.9 ?C) (Oral)   Ht 5' 7.32" (1.71 m)   Wt 214 lb (97.1 kg)   SpO2 98%   BMI 33.20 kg/m?   ? ?Subjective:  ? ? Patient ID: Craig Ryan, male    DOB: Aug 03, 1961, 61 y.o.   MRN: [id number] ? ?Chief Complaint  ?Patient presents with  ?? Hypertension  ?? Benign Prostatic Hypertrophy  ?? Hyperlipidemia  ?? Prediabetes  ? ? ?HPI: ?Craig Ryan is a 61 y.o. male ? ?Pt is here for a physical feels well per his verbal record  Vitals noted stable.  ? ? ?Hypertension ?This is a chronic problem. Pertinent negatives include no anxiety, blurred vision, chest pain, headaches, malaise/fatigue, neck pain, orthopnea, palpitations, peripheral edema, PND, shortness of breath or sweats.  ?Benign Prostatic Hypertrophy ?Irritative symptoms do not include frequency or urgency. Pertinent negatives include no chills, dysuria, hematuria, nausea or vomiting.  ?Hyperlipidemia ?Pertinent negatives include no chest pain, myalgias or shortness of breath.  ? ?Chief Complaint  ?Patient presents with  ?? Hypertension  ?? Benign Prostatic Hypertrophy  ?? Hyperlipidemia  ?? Prediabetes  ? ? ?Relevant past medical, surgical, family and social history reviewed and updated as indicated. Interim medical history since our last visit reviewed. ?Allergies and medications reviewed and updated. ? ?Review of Systems  ?Constitutional:  Negative for activity change, appetite change, chills, fatigue, fever and malaise/fatigue.  ?HENT:  Negative for congestion, ear discharge, ear pain and facial swelling.   ?Eyes:  Negative for blurred vision, pain, discharge and itching.  ?Respiratory:  Negative for cough, chest tightness, shortness of breath and wheezing.   ?Cardiovascular:  Negative for chest pain, palpitations, orthopnea, leg swelling and PND.  ?Gastrointestinal:  Negative for abdominal distention, abdominal pain, blood in stool, constipation, diarrhea, nausea and vomiting.  ?Endocrine: Negative for cold  intolerance, heat intolerance, polydipsia, polyphagia and polyuria.  ?Genitourinary:  Negative for difficulty urinating, dysuria, flank pain, frequency, hematuria and urgency.  ?Musculoskeletal:  Negative for arthralgias, gait problem, joint swelling, myalgias and neck pain.  ?Skin:  Negative for color change, rash and wound.  ?Neurological:  Negative for dizziness, tremors, speech difficulty, weakness, light-headedness, numbness and headaches.  ?Hematological:  Does not bruise/bleed easily.  ?Psychiatric/Behavioral:  Negative for agitation, confusion, decreased concentration, sleep disturbance and suicidal ideas.   ? ?Per HPI unless specifically indicated above ? ?   ?Objective:  ?  ?BP 137/86   Pulse 67   Temp 98.4 ?F (36.9 ?C) (Oral)   Ht 5' 7.32" (1.71 m)   Wt 214 lb (97.1 kg)   SpO2 98%   BMI 33.20 kg/m?   ?Wt Readings from Last 3 Encounters:  ?03/18/22 214 lb (97.1 kg)  ?12/11/21 212 lb 6.4 oz (96.3 kg)  ?09/18/21 214 lb 9.6 oz (97.3 kg)  ?  ?Physical Exam ?Vitals and nursing note reviewed.  ?Constitutional:   ?   General: He is not in acute distress. ?   Appearance: Normal appearance. He is not ill-appearing or diaphoretic.  ?HENT:  ?   Head: Normocephalic and atraumatic.  ?   Right Ear: Tympanic membrane and external ear normal. There is no impacted cerumen.  ?   Left Ear: External ear normal.  ?   Nose: No congestion or rhinorrhea.  ?   Mouth/Throat:  ?   Pharynx: No oropharyngeal exudate or posterior oropharyngeal erythema.  ?Eyes:  ?   Conjunctiva/sclera: Conjunctivae normal.  ?   Pupils:  Pupils are equal, round, and reactive to light.  ?Cardiovascular:  ?   Rate and Rhythm: Normal rate and regular rhythm.  ?   Heart sounds: No murmur heard. ?  No friction rub. No gallop.  ?Pulmonary:  ?   Effort: No respiratory distress.  ?   Breath sounds: No stridor. No wheezing or rhonchi.  ?Chest:  ?   Chest wall: No tenderness.  ?Abdominal:  ?   General: Abdomen is flat. Bowel sounds are normal.  ?    Palpations: Abdomen is soft. There is no mass.  ?   Tenderness: There is no abdominal tenderness.  ?Musculoskeletal:  ?   Cervical back: Normal range of motion and neck supple. No rigidity or tenderness.  ?   Left lower leg: No edema.  ?Skin: ?   General: Skin is warm and dry.  ?Neurological:  ?   Mental Status: He is alert.  ? ? ?Results for orders placed or performed in visit on 03/18/22  ?Urinalysis, Routine w reflex microscopic  ?Result Value Ref Range  ? Specific Gravity, UA 1.025 1.005 - 1.030  ? pH, UA 6.0 5.0 - 7.5  ? Color, UA Yellow Yellow  ? Appearance Ur Clear Clear  ? Leukocytes,UA Negative Negative  ? Protein,UA Trace (A) Negative/Trace  ? Glucose, UA Negative Negative  ? Ketones, UA Negative Negative  ? RBC, UA Negative Negative  ? Bilirubin, UA Negative Negative  ? Urobilinogen, Ur 0.2 0.2 - 1.0 mg/dL  ? Nitrite, UA Negative Negative  ? ?   ? ? ?Current Outpatient Medications:  ??  amLODipine (NORVASC) 5 MG tablet, TAKE 1 TABLET DAILY, Disp: 90 tablet, Rfl: 1 ??  aspirin EC 81 MG tablet, Take 81 mg by mouth daily., Disp: , Rfl:  ??  atorvastatin (LIPITOR) 40 MG tablet, TAKE 1 TABLET DAILY AT 6 P.M., Disp: 90 tablet, Rfl: 3 ??  Cetirizine HCl (ZYRTEC PO), Take by mouth daily., Disp: , Rfl:  ??  fexofenadine (ALLEGRA ALLERGY) 180 MG tablet, Take 1 tablet (180 mg total) by mouth daily., Disp: 10 tablet, Rfl: 1 ??  Fish Oil OIL, 1 capsule by Does not apply route daily., Disp: , Rfl:  ??  fluticasone (FLONASE) 50 MCG/ACT nasal spray, Place 2 sprays into both nostrils daily., Disp: , Rfl:  ??  lisinopril (ZESTRIL) 40 MG tablet, TAKE 1 TABLET DAILY, Disp: 90 tablet, Rfl: 1 ??  metoprolol succinate (TOPROL-XL) 50 MG 24 hr tablet, TAKE 1 TABLET DAILY, TAKE WITH OR IMMEDIATELY FOLLOWING A MEAL, Disp: 90 tablet, Rfl: 1 ??  Multiple Vitamin (MULTIVITAMIN) capsule, Take 1 capsule by mouth daily., Disp: , Rfl:  ??  pyridoxine (B-6) 100 MG tablet, Take 100 mg by mouth daily., Disp: , Rfl:  ??  tamsulosin (FLOMAX)  0.4 MG CAPS capsule, Take 1 capsule (0.4 mg total) by mouth daily., Disp: 90 capsule, Rfl: 3 ??  Tetrahydrozoline HCl (RA EYE DROPS OP), Apply to eye., Disp: , Rfl:  ??  VITAMIN D PO, Take by mouth 2 (two) times daily., Disp: , Rfl:  ??  SHINGRIX injection, INJECT 0.5 ML INTO THE MUSCLE ONCE FOR 1 DOSE (DOSE NUMBER 1) (Patient not taking: Reported on 03/18/2022), Disp: 1 each, Rfl: 0  ? ? ?Assessment & Plan:  ?Htn ?Is on norvasc 5 mg , lisinopril 40 mg and toprol 50 mg  ?Pt fu on bp check  to see if we can reduce amloidpine to 2.5 / eliminate this pt doesn't keep a check on his bp  at home at this point. ?Continue current meds.  Medication compliance emphasised. pt advised to keep Bp logs. Pt verbalised understanding of the same. Pt to have a low salt diet . Exercise to reach a goal of at least 150 mins a week.  lifestyle modifications explained and pt understands importance of the above. ?Under good control on current regimen. Continue current regimen. Continue to monitor. Call with any concerns. Refills given. Labs drawn today. ? ?2. HLD is on lipitor 40 mg. ?recheck FLP, check LFT's work on diet, SE of meds explained to pt. low fat and high fiber diet explained to pt. ? ? ?Problem List Items Addressed This Visit   ? ?  ? Cardiovascular and Mediastinum  ? Essential hypertension  ? Relevant Orders  ? CBC with Differential/Platelet  ? Comprehensive metabolic panel  ? Lipid panel  ? Urinalysis, Routine w reflex microscopic (Completed)  ? TSH  ?  ? Other  ? Hyperlipidemia - Primary  ? Relevant Orders  ? CBC with Differential/Platelet  ? Comprehensive metabolic panel  ? Lipid panel  ? Urinalysis, Routine w reflex microscopic (Completed)  ? TSH  ?  ? ?Orders Placed This Encounter  ?Procedures  ?? CBC with Differential/Platelet  ?? Comprehensive metabolic panel  ?? Lipid panel  ?? Urinalysis, Routine w reflex microscopic  ?? TSH  ?  ? ?No orders of the defined types were placed in this encounter. ?  ? ?Follow up  plan: ?Return in about 1 week (around 03/25/2022). ? ? ?

## 2022-03-19 LAB — CBC WITH DIFFERENTIAL/PLATELET
Basophils Absolute: 0.1 10*3/uL (ref 0.0–0.2)
Basos: 1 %
EOS (ABSOLUTE): 0.2 10*3/uL (ref 0.0–0.4)
Eos: 2 %
Hematocrit: 48.2 % (ref 37.5–51.0)
Hemoglobin: 15.9 g/dL (ref 13.0–17.7)
Immature Grans (Abs): 0 10*3/uL (ref 0.0–0.1)
Immature Granulocytes: 0 %
Lymphocytes Absolute: 1.7 10*3/uL (ref 0.7–3.1)
Lymphs: 25 %
MCH: 30.3 pg (ref 26.6–33.0)
MCHC: 33 g/dL (ref 31.5–35.7)
MCV: 92 fL (ref 79–97)
Monocytes Absolute: 0.6 10*3/uL (ref 0.1–0.9)
Monocytes: 9 %
Neutrophils Absolute: 4.4 10*3/uL (ref 1.4–7.0)
Neutrophils: 63 %
Platelets: 236 10*3/uL (ref 150–450)
RBC: 5.25 x10E6/uL (ref 4.14–5.80)
RDW: 13.3 % (ref 11.6–15.4)
WBC: 7 10*3/uL (ref 3.4–10.8)

## 2022-03-19 LAB — LIPID PANEL
Chol/HDL Ratio: 3 ratio (ref 0.0–5.0)
Cholesterol, Total: 142 mg/dL (ref 100–199)
HDL: 47 mg/dL (ref >39)
LDL Chol Calc (NIH): 74 mg/dL (ref 0–99)
Triglycerides: 119 mg/dL (ref 0–149)
VLDL Cholesterol Cal: 21 mg/dL (ref 5–40)

## 2022-03-19 LAB — COMPREHENSIVE METABOLIC PANEL
ALT: 31 IU/L (ref 0–44)
AST: 21 IU/L (ref 0–40)
Albumin/Globulin Ratio: 1.5 (ref 1.2–2.2)
Albumin: 4.2 g/dL (ref 3.8–4.9)
Alkaline Phosphatase: 105 IU/L (ref 44–121)
BUN/Creatinine Ratio: 17 (ref 10–24)
BUN: 19 mg/dL (ref 8–27)
Bilirubin Total: 0.4 mg/dL (ref 0.0–1.2)
CO2: 22 mmol/L (ref 20–29)
Calcium: 8.9 mg/dL (ref 8.6–10.2)
Chloride: 106 mmol/L (ref 96–106)
Creatinine, Ser: 1.15 mg/dL (ref 0.76–1.27)
Globulin, Total: 2.8 g/dL (ref 1.5–4.5)
Glucose: 102 mg/dL — ABNORMAL HIGH (ref 70–99)
Potassium: 4.2 mmol/L (ref 3.5–5.2)
Sodium: 142 mmol/L (ref 134–144)
Total Protein: 7 g/dL (ref 6.0–8.5)
eGFR: 73 mL/min/{1.73_m2} (ref >59)

## 2022-03-19 LAB — TSH: TSH: 0.96 u[IU]/mL (ref 0.450–4.500)

## 2022-03-23 ENCOUNTER — Other Ambulatory Visit: Payer: Self-pay | Admitting: Internal Medicine

## 2022-03-23 ENCOUNTER — Other Ambulatory Visit: Payer: Self-pay | Admitting: Nurse Practitioner

## 2022-03-23 DIAGNOSIS — I10 Essential (primary) hypertension: Secondary | ICD-10-CM

## 2022-03-24 NOTE — Telephone Encounter (Signed)
Requested medication (s) are due for refill today: Yes ? ?Requested medication (s) are on the active medication list: Yes ? ?Last refill:  09/25/21 ? ?Future visit scheduled: Yes ? ?Notes to clinic:  Dr. Gaspar Cola name on med list. ? ? ? ?Requested Prescriptions  ?Pending Prescriptions Disp Refills  ? lisinopril (ZESTRIL) 40 MG tablet [Pharmacy Med Name: LISINOPRIL TABS '40MG'$ ] 90 tablet 1  ?  Sig: TAKE 1 TABLET DAILY  ?  ? Cardiovascular:  ACE Inhibitors Passed - 03/23/2022 11:38 AM  ?  ?  Passed - Cr in normal range and within 180 days  ?  Creatinine, Ser  ?Date Value Ref Range Status  ?03/18/2022 1.15 0.76 - 1.27 mg/dL Final  ?   ?  ?  Passed - K in normal range and within 180 days  ?  Potassium  ?Date Value Ref Range Status  ?03/18/2022 4.2 3.5 - 5.2 mmol/L Final  ?   ?  ?  Passed - Patient is not pregnant  ?  ?  Passed - Last BP in normal range  ?  BP Readings from Last 1 Encounters:  ?03/18/22 137/86  ?   ?  ?  Passed - Valid encounter within last 6 months  ?  Recent Outpatient Visits   ? ?      ? 6 days ago Hyperlipidemia, unspecified hyperlipidemia type  ? [facility] Vigg, Avanti, MD  ? 3 months ago Sinus pressure  ? [place], Hickory T, NP  ? 6 months ago Need for influenza vaccination  ? [facility] Vigg, Avanti, MD  ? 6 months ago Annual physical exam  ? [facility] Vigg, Avanti, MD  ? 1 year ago Screening for colon cancer  ? [facility], Lauren A, NP  ? ?  ?  ?Future Appointments   ? ?        ? In 6 days Vigg, Avanti, MD [facility]  ? ?  ? ? ?  ?  ?  ? amLODipine (NORVASC) 5 MG tablet [Pharmacy Med Name: AMLODIPINE BESYLATE TABS '5MG'$ ] 90 tablet 3  ?  Sig: TAKE 1 TABLET DAILY  ?  ? Cardiovascular: Calcium Channel Blockers 2 Passed - 03/23/2022 11:38 AM  ?  ?  Passed - Last BP in normal range  ?  BP Readings from Last 1 Encounters:  ?03/18/22 137/86  ?   ?  ?  Passed - Last Heart Rate in normal range  ?   Pulse Readings from Last 1 Encounters:  ?03/18/22 67  ?   ?  ?  Passed - Valid encounter within last 6 months  ?  Recent Outpatient Visits   ? ?      ? 6 days ago Hyperlipidemia, unspecified hyperlipidemia type  ? [facility] Vigg, Avanti, MD  ? 3 months ago Sinus pressure  ? [place], [place] T, NP  ? 6 months ago Need for influenza vaccination  ? [facility] Vigg, Avanti, MD  ? 6 months ago Annual physical exam  ? [facility] Vigg, Avanti, MD  ? 1 year ago Screening for colon cancer  ? [facility], Lauren A, NP  ? ?  ?  ?Future Appointments   ? ?        ? In 6 days Vigg, Avanti, MD [facility]  ? ?  ? ? ?  ?  ?  ?  metoprolol succinate (TOPROL-XL) 50 MG 24 hr tablet [Pharmacy Med Name: METOPROLOL SUCCINATE ER TABS '50MG'$ ] 90 tablet 1  ?  Sig: TAKE 1 TABLET DAILY, TAKE WITH OR IMMEDIATELY FOLLOWING A MEAL  ?  ? Cardiovascular:  Beta Blockers Passed - 03/23/2022 11:38 AM  ?  ?  Passed - Last BP in normal range  ?  BP Readings from Last 1 Encounters:  ?03/18/22 137/86  ?   ?  ?  Passed - Last Heart Rate in normal range  ?  Pulse Readings from Last 1 Encounters:  ?03/18/22 67  ?   ?  ?  Passed - Valid encounter within last 6 months  ?  Recent Outpatient Visits   ? ?      ? 6 days ago Hyperlipidemia, unspecified hyperlipidemia type  ? [facility] Vigg, Avanti, MD  ? 3 months ago Sinus pressure  ? [place], [place] T, NP  ? 6 months ago Need for influenza vaccination  ? [facility] Vigg, Avanti, MD  ? 6 months ago Annual physical exam  ? [facility] Vigg, Avanti, MD  ? 1 year ago Screening for colon cancer  ? [facility], Lauren A, NP  ? ?  ?  ?Future Appointments   ? ?        ? In 6 days Vigg, Avanti, MD [facility]  ? ?  ? ? ?  ?  ?  ? ?

## 2022-03-30 ENCOUNTER — Ambulatory Visit: Payer: BC Managed Care – PPO | Admitting: Internal Medicine

## 2022-03-30 ENCOUNTER — Encounter: Payer: Self-pay | Admitting: Internal Medicine

## 2022-03-30 VITALS — BP 138/82 | HR 70 | Temp 98.3°F | Ht 67.32 in | Wt 214.2 lb

## 2022-03-30 DIAGNOSIS — I10 Essential (primary) hypertension: Secondary | ICD-10-CM

## 2022-03-30 DIAGNOSIS — R7303 Prediabetes: Secondary | ICD-10-CM

## 2022-03-30 DIAGNOSIS — E785 Hyperlipidemia, unspecified: Secondary | ICD-10-CM | POA: Diagnosis not present

## 2022-03-30 NOTE — Progress Notes (Signed)
BP 138/82   Pulse 70   Temp 98.3 F (36.8 C) (Oral)   Ht 5' 7.32" (1.71 m)   Wt 214 lb 3.2 oz (97.2 kg)   SpO2 99%   BMI 33.23 kg/m    Subjective:    Patient ID: Craig Ryan, male    DOB: 1961/04/23, 61 y.o.   MRN: [id number]  Chief Complaint  Patient presents with  . Hypertension    HPI: Craig Ryan is a 61 y.o. male  Hypertension This is a chronic problem. The current episode started more than 1 year ago. The problem has been waxing and waning since onset. The problem is controlled.  Hyperlipidemia This is a chronic problem. The problem is controlled.    Chief Complaint  Patient presents with  . Hypertension    Relevant past medical, surgical, family and social history reviewed and updated as indicated. Interim medical history since our last visit reviewed. Allergies and medications reviewed and updated.  Review of Systems  Per HPI unless specifically indicated above     Objective:    BP 138/82   Pulse 70   Temp 98.3 F (36.8 C) (Oral)   Ht 5' 7.32" (1.71 m)   Wt 214 lb 3.2 oz (97.2 kg)   SpO2 99%   BMI 33.23 kg/m   Wt Readings from Last 3 Encounters:  03/30/22 214 lb 3.2 oz (97.2 kg)  03/18/22 214 lb (97.1 kg)  12/11/21 212 lb 6.4 oz (96.3 kg)    Physical Exam Vitals and nursing note reviewed.  Constitutional:      General: He is not in acute distress.    Appearance: Normal appearance. He is not ill-appearing or diaphoretic.  HENT:     Head: Normocephalic and atraumatic.     Right Ear: Tympanic membrane and external ear normal. There is no impacted cerumen.     Left Ear: External ear normal.     Nose: No congestion or rhinorrhea.     Mouth/Throat:     Pharynx: No oropharyngeal exudate or posterior oropharyngeal erythema.  Eyes:     Conjunctiva/sclera: Conjunctivae normal.     Pupils: Pupils are equal, round, and reactive to light.  Cardiovascular:     Rate and Rhythm: Normal rate and regular rhythm.     Heart sounds: No murmur  heard.   No friction rub. No gallop.  Pulmonary:     Effort: No respiratory distress.     Breath sounds: No stridor. No wheezing or rhonchi.  Chest:     Chest wall: No tenderness.  Abdominal:     General: Abdomen is flat. Bowel sounds are normal. There is no distension.     Palpations: Abdomen is soft. There is no mass.     Tenderness: There is no abdominal tenderness. There is no right CVA tenderness, left CVA tenderness, guarding or rebound.     Hernia: No hernia is present.  Musculoskeletal:        General: No swelling, tenderness, deformity or signs of injury.     Cervical back: Normal range of motion and neck supple. No rigidity or tenderness.     Right lower leg: No edema.     Left lower leg: No edema.  Skin:    General: Skin is warm and dry.     Coloration: Skin is not jaundiced or pale.     Findings: No erythema or lesion.  Neurological:     General: No focal deficit present.     Mental  Status: He is alert and oriented to person, place, and time.  Psychiatric:        Mood and Affect: Mood normal.        Behavior: Behavior normal.        Thought Content: Thought content normal.        Judgment: Judgment normal.    Results for orders placed or performed in visit on 03/18/22  CBC with Differential/Platelet  Result Value Ref Range   WBC 7.0 3.4 - 10.8 x10E3/uL   RBC 5.25 4.14 - 5.80 x10E6/uL   Hemoglobin 15.9 13.0 - 17.7 g/dL   Hematocrit 48.2 37.5 - 51.0 %   MCV 92 79 - 97 fL   MCH 30.3 26.6 - 33.0 pg   MCHC 33.0 31.5 - 35.7 g/dL   RDW 13.3 11.6 - 15.4 %   Platelets 236 150 - 450 x10E3/uL   Neutrophils 63 Not Estab. %   Lymphs 25 Not Estab. %   Monocytes 9 Not Estab. %   Eos 2 Not Estab. %   Basos 1 Not Estab. %   Neutrophils Absolute 4.4 1.4 - 7.0 x10E3/uL   Lymphocytes Absolute 1.7 0.7 - 3.1 x10E3/uL   Monocytes Absolute 0.6 0.1 - 0.9 x10E3/uL   EOS (ABSOLUTE) 0.2 0.0 - 0.4 x10E3/uL   Basophils Absolute 0.1 0.0 - 0.2 x10E3/uL   Immature Granulocytes 0 Not  Estab. %   Immature Grans (Abs) 0.0 0.0 - 0.1 x10E3/uL  Comprehensive metabolic panel  Result Value Ref Range   Glucose 102 (H) 70 - 99 mg/dL   BUN 19 8 - 27 mg/dL   Creatinine, Ser 1.15 0.76 - 1.27 mg/dL   eGFR 73 >59 mL/min/1.73   BUN/Creatinine Ratio 17 10 - 24   Sodium 142 134 - 144 mmol/L   Potassium 4.2 3.5 - 5.2 mmol/L   Chloride 106 96 - 106 mmol/L   CO2 22 20 - 29 mmol/L   Calcium 8.9 8.6 - 10.2 mg/dL   Total Protein 7.0 6.0 - 8.5 g/dL   Albumin 4.2 3.8 - 4.9 g/dL   Globulin, Total 2.8 1.5 - 4.5 g/dL   Albumin/Globulin Ratio 1.5 1.2 - 2.2   Bilirubin Total 0.4 0.0 - 1.2 mg/dL   Alkaline Phosphatase 105 44 - 121 IU/L   AST 21 0 - 40 IU/L   ALT 31 0 - 44 IU/L  Urinalysis, Routine w reflex microscopic  Result Value Ref Range   Specific Gravity, UA 1.025 1.005 - 1.030   pH, UA 6.0 5.0 - 7.5   Color, UA Yellow Yellow   Appearance Ur Clear Clear   Leukocytes,UA Negative Negative   Protein,UA Trace (A) Negative/Trace   Glucose, UA Negative Negative   Ketones, UA Negative Negative   RBC, UA Negative Negative   Bilirubin, UA Negative Negative   Urobilinogen, Ur 0.2 0.2 - 1.0 mg/dL   Nitrite, UA Negative Negative  TSH  Result Value Ref Range   TSH 0.960 0.450 - 4.500 uIU/mL  Lipid panel  Result Value Ref Range   Cholesterol, Total 142 100 - 199 mg/dL   Triglycerides 119 0 - 149 mg/dL   HDL 47 >39 mg/dL   VLDL Cholesterol Cal 21 5 - 40 mg/dL   LDL Chol Calc (NIH) 74 0 - 99 mg/dL   Chol/HDL Ratio 3.0 0.0 - 5.0 ratio        Current Outpatient Medications:  .  amLODipine (NORVASC) 5 MG tablet, TAKE 1 TABLET DAILY, Disp: 90 tablet, Rfl: 3 .  aspirin EC 81 MG tablet, Take 81 mg by mouth daily., Disp: , Rfl:  .  atorvastatin (LIPITOR) 40 MG tablet, TAKE 1 TABLET DAILY AT 6 P.M., Disp: 90 tablet, Rfl: 3 .  Cetirizine HCl (ZYRTEC PO), Take by mouth daily., Disp: , Rfl:  .  fexofenadine (ALLEGRA ALLERGY) 180 MG tablet, Take 1 tablet (180 mg total) by mouth daily., Disp:  10 tablet, Rfl: 1 .  Fish Oil OIL, 1 capsule by Does not apply route daily., Disp: , Rfl:  .  fluticasone (FLONASE) 50 MCG/ACT nasal spray, Place 2 sprays into both nostrils daily., Disp: , Rfl:  .  lisinopril (ZESTRIL) 40 MG tablet, TAKE 1 TABLET DAILY, Disp: 90 tablet, Rfl: 1 .  metoprolol succinate (TOPROL-XL) 50 MG 24 hr tablet, TAKE 1 TABLET DAILY, TAKE WITH OR IMMEDIATELY FOLLOWING A MEAL, Disp: 90 tablet, Rfl: 1 .  Multiple Vitamin (MULTIVITAMIN) capsule, Take 1 capsule by mouth daily., Disp: , Rfl:  .  pyridoxine (B-6) 100 MG tablet, Take 100 mg by mouth daily., Disp: , Rfl:  .  SHINGRIX injection, INJECT 0.5 ML INTO THE MUSCLE ONCE FOR 1 DOSE (DOSE NUMBER 1), Disp: 1 each, Rfl: 0 .  tamsulosin (FLOMAX) 0.4 MG CAPS capsule, TAKE 1 CAPSULE DAILY, Disp: 90 capsule, Rfl: 3 .  Tetrahydrozoline HCl (RA EYE DROPS OP), Apply to eye., Disp: , Rfl:  .  VITAMIN D PO, Take by mouth 2 (two) times daily., Disp: , Rfl:     Assessment & Plan:   HTN : is on norvasc , toprol xl  Continue current meds.  Medication compliance emphasised. pt advised to keep Bp logs. Pt verbalised understanding of the same. Pt to have a low salt diet . Exercise to reach a goal of at least 150 mins a week.  lifestyle modifications explained and pt understands importance of the above. Under good control on current regimen. Continue current regimen. Continue to monitor. Call with any concerns. Refills given. Labs drawn today.  Obesity :  bmi is 33.23  Lifestyle modifications advised to pt.  Portion control and avoiding high carb low fat diet advised.  Diet plan given to pt   exercise plan given and encouraged.  To increase exercise to 150 mins a week ie 21/2 hours a week. Pt verbalises understanding of the above.   HLD :  Is on lipitor 40 mg.  recheck FLP, check LFT's work on diet, SE of meds explained to pt. low fat and high fiber diet explained to pt.    Problem List Items Addressed This Visit       Cardiovascular  and Mediastinum   Essential hypertension     Other   Hyperlipidemia - Primary   Prediabetes     No orders of the defined types were placed in this encounter.    No orders of the defined types were placed in this encounter.    Follow up plan: Return in about 6 months (around 09/30/2022).

## 2022-04-02 DIAGNOSIS — Z85828 Personal history of other malignant neoplasm of skin: Secondary | ICD-10-CM | POA: Diagnosis not present

## 2022-04-02 DIAGNOSIS — L821 Other seborrheic keratosis: Secondary | ICD-10-CM | POA: Diagnosis not present

## 2022-04-02 DIAGNOSIS — D2261 Melanocytic nevi of right upper limb, including shoulder: Secondary | ICD-10-CM | POA: Diagnosis not present

## 2022-04-02 DIAGNOSIS — D225 Melanocytic nevi of trunk: Secondary | ICD-10-CM | POA: Diagnosis not present

## 2022-04-02 DIAGNOSIS — L570 Actinic keratosis: Secondary | ICD-10-CM | POA: Diagnosis not present

## 2022-04-12 DIAGNOSIS — M5431 Sciatica, right side: Secondary | ICD-10-CM | POA: Diagnosis not present

## 2022-04-12 DIAGNOSIS — M9905 Segmental and somatic dysfunction of pelvic region: Secondary | ICD-10-CM | POA: Diagnosis not present

## 2022-04-12 DIAGNOSIS — M9903 Segmental and somatic dysfunction of lumbar region: Secondary | ICD-10-CM | POA: Diagnosis not present

## 2022-04-12 DIAGNOSIS — M5416 Radiculopathy, lumbar region: Secondary | ICD-10-CM | POA: Diagnosis not present

## 2022-05-10 ENCOUNTER — Other Ambulatory Visit: Payer: Self-pay

## 2022-05-10 ENCOUNTER — Emergency Department: Payer: BC Managed Care – PPO

## 2022-05-10 ENCOUNTER — Emergency Department
Admission: EM | Admit: 2022-05-10 | Discharge: 2022-05-10 | Disposition: A | Discharge status: Home / Self Care | Payer: BC Managed Care – PPO | Attending: Emergency Medicine | Admitting: Emergency Medicine

## 2022-05-10 DIAGNOSIS — K449 Diaphragmatic hernia without obstruction or gangrene: Secondary | ICD-10-CM | POA: Diagnosis not present

## 2022-05-10 DIAGNOSIS — K760 Fatty (change of) liver, not elsewhere classified: Secondary | ICD-10-CM | POA: Diagnosis not present

## 2022-05-10 DIAGNOSIS — K649 Unspecified hemorrhoids: Secondary | ICD-10-CM | POA: Diagnosis not present

## 2022-05-10 DIAGNOSIS — K5730 Diverticulosis of large intestine without perforation or abscess without bleeding: Secondary | ICD-10-CM | POA: Diagnosis not present

## 2022-05-10 DIAGNOSIS — R109 Unspecified abdominal pain: Secondary | ICD-10-CM | POA: Diagnosis not present

## 2022-05-10 DIAGNOSIS — N200 Calculus of kidney: Secondary | ICD-10-CM | POA: Diagnosis not present

## 2022-05-10 DIAGNOSIS — I10 Essential (primary) hypertension: Secondary | ICD-10-CM | POA: Diagnosis not present

## 2022-05-10 LAB — BASIC METABOLIC PANEL WITH GFR
Anion gap: 6 (ref 5–15)
BUN: 18 mg/dL (ref 6–20)
CO2: 21 mmol/L — ABNORMAL LOW (ref 22–32)
Calcium: 9.2 mg/dL (ref 8.9–10.3)
Chloride: 112 mmol/L — ABNORMAL HIGH (ref 98–111)
Creatinine, Ser: 1.24 mg/dL (ref 0.61–1.24)
GFR, Estimated: >60 mL/min (ref >60)
Glucose, Bld: 175 mg/dL — ABNORMAL HIGH (ref 70–99)
Potassium: 3.5 mmol/L (ref 3.5–5.1)
Sodium: 139 mmol/L (ref 135–145)

## 2022-05-10 LAB — URINALYSIS, ROUTINE W REFLEX MICROSCOPIC
Bilirubin Urine: NEGATIVE
Glucose, UA: 50 mg/dL — AB
Hgb urine dipstick: NEGATIVE
Ketones, ur: NEGATIVE mg/dL
Leukocytes,Ua: NEGATIVE
Nitrite: NEGATIVE
Protein, ur: NEGATIVE mg/dL
Specific Gravity, Urine: 1.016 (ref 1.005–1.030)
pH: 6 (ref 5.0–8.0)

## 2022-05-10 LAB — CBC
HCT: 49.7 % (ref 39.0–52.0)
Hemoglobin: 16.4 g/dL (ref 13.0–17.0)
MCH: 30.1 pg (ref 26.0–34.0)
MCHC: 33 g/dL (ref 30.0–36.0)
MCV: 91.4 fL (ref 80.0–100.0)
Platelets: 165 10*3/uL (ref 150–400)
RBC: 5.44 MIL/uL (ref 4.22–5.81)
RDW: 13 % (ref 11.5–15.5)
WBC: 6.7 10*3/uL (ref 4.0–10.5)
nRBC: 0 % (ref 0.0–0.2)

## 2022-05-10 MED ORDER — OXYCODONE-ACETAMINOPHEN 10-325 MG PO TABS: 1.0000 | ORAL_TABLET | Freq: Four times a day (QID) | ORAL | 0 refills | Status: DC | PRN

## 2022-05-10 MED ORDER — KETOROLAC TROMETHAMINE 60 MG/2ML IM SOLN
30.0000 mg | Freq: Once | INTRAMUSCULAR | Status: AC
  Administered 2022-05-10: 30 mg via INTRAMUSCULAR
  Filled 2022-05-10: qty 2

## 2022-05-10 NOTE — ED Triage Notes (Signed)
Pt come with c/o left flank pain that radiates to back that started last week. Pt has hx of kidney stones. Pt states no N/V.

## 2022-05-10 NOTE — ED Provider Notes (Signed)
[facility] Provider Note    Event Date/Time   First MD Initiated Contact with Patient 05/10/22 [phone]     (approximate)   History   Flank Pain   HPI  Craig Ryan is a 61 y.o. male presenting to the emergency department for treatment and evaluation of left flank pain that radiates around to the left lower abdomen.  Patient has a longstanding history of kidney stones requiring lithotripsy.  The symptoms are similar except for the radiation around to the pelvic area.  He is also had some bleeding noted in the toilet after bowel movement.  He has history of hemorrhoids and feels that this is likely related but wanted to make sure since the kidney stone symptoms were somewhat different this time.  He noticed blood once last night in the toilet bowl that was bright red and then this morning noticed bright red blood on the tissue.  Last colonoscopy was approximately 1 year ago without any concerning findings.  Past Medical History:  Diagnosis Date   Allergy    History of shingles    Hyperlipidemia    Hyperlipidemia    Hypertension    Nephrolithiasis    OSA on CPAP      Physical Exam   Triage Vital Signs: ED Triage Vitals  Enc Vitals Group     BP 05/10/22 0830 (!) 167/96     Pulse Rate 05/10/22 0830 69     Resp 05/10/22 0830 18     Temp 05/10/22 0830 97.9 F (36.6 C)     Temp Source 05/10/22 0830 Oral     SpO2 05/10/22 0830 97 %     Weight --      Height --      Head Circumference --      Peak Flow --      Pain Score 05/10/22 0822 5     Pain Loc --      Pain Edu? --      Excl. in [place]? --     Most recent vital signs: Vitals:   05/10/22 0830  BP: (!) 167/96  Pulse: 69  Resp: 18  Temp: 97.9 F (36.6 C)  SpO2: 97%    General: Awake, no distress.  CV:  Good peripheral perfusion.  Resp:  Normal effort.  Abd:  No distention.  Other:  CVA tenderness on the left.  No rebound tenderness of the abdomen.   ED Results / Procedures /  Treatments   Labs (all labs ordered are listed, but only abnormal results are displayed) Labs Reviewed  URINALYSIS, ROUTINE W REFLEX MICROSCOPIC - Abnormal; Notable for the following components:      Result Value   Color, Urine YELLOW (*)    APPearance CLEAR (*)    Glucose, UA 50 (*)    All other components within normal limits  BASIC METABOLIC PANEL - Abnormal; Notable for the following components:   Chloride 112 (*)    CO2 21 (*)    Glucose, Bld 175 (*)    All other components within normal limits  CBC     EKG     RADIOLOGY  CT shows nephrolithiasis greater on the left than the right.  No hydronephrosis.  I have independently reviewed and interpreted imaging as well as reviewed report from radiology.  PROCEDURES:  Critical Care performed: No  Procedures   MEDICATIONS ORDERED IN ED:  Medications  ketorolac (TORADOL) injection 30 mg (30 mg Intramuscular Given 05/10/22 1104)  IMPRESSION / MDM / ASSESSMENT AND PLAN / ED COURSE   I reviewed the triage vital signs and the nursing notes.  Differential diagnosis includes, but is not limited to: Pyelonephritis, kidney stone, diverticulitis, inflammatory bowel disease, bright red blood per rectum secondary to hemorrhoids.  Patient's presentation is most consistent with acute presentation with potential threat to life or bodily function.  61 year old male presenting to the emergency department for treatment and evaluation of left flank pain with radiation into the left lower abdomen and rectal bleeding.  Exam, urinalysis and labs are reassuring.  CT shows nephrolithiasis without hydronephrosis.  Patient will be encouraged to continue taking his Flomax use and will be given a prescription for pain medication.  He is to follow-up with the [facility] stone clinic as he is already an established patient there.  If symptoms change or worsen or if he is unable to schedule an appointment, he is to return to the emergency  department.      FINAL CLINICAL IMPRESSION(S) / ED DIAGNOSES   Final diagnoses:  Nephrolithiasis  Hemorrhoids, unspecified hemorrhoid type     Rx / DC Orders   ED Discharge Orders          Ordered    oxyCODONE-acetaminophen (PERCOCET) 10-325 MG tablet  Every 6 hours PRN        05/10/22 1051             Note:  This document was prepared using Dragon voice recognition software and may include unintentional dictation errors.   Victorino Dike, FNP 05/12/22 1543    Carrie Mew, MD 05/16/22 0002

## 2022-05-26 DIAGNOSIS — M9903 Segmental and somatic dysfunction of lumbar region: Secondary | ICD-10-CM | POA: Diagnosis not present

## 2022-05-26 DIAGNOSIS — M5416 Radiculopathy, lumbar region: Secondary | ICD-10-CM | POA: Diagnosis not present

## 2022-05-26 DIAGNOSIS — M5431 Sciatica, right side: Secondary | ICD-10-CM | POA: Diagnosis not present

## 2022-05-26 DIAGNOSIS — M9905 Segmental and somatic dysfunction of pelvic region: Secondary | ICD-10-CM | POA: Diagnosis not present

## 2022-06-28 DIAGNOSIS — M9903 Segmental and somatic dysfunction of lumbar region: Secondary | ICD-10-CM | POA: Diagnosis not present

## 2022-06-28 DIAGNOSIS — M5416 Radiculopathy, lumbar region: Secondary | ICD-10-CM | POA: Diagnosis not present

## 2022-06-28 DIAGNOSIS — M5431 Sciatica, right side: Secondary | ICD-10-CM | POA: Diagnosis not present

## 2022-06-28 DIAGNOSIS — M9905 Segmental and somatic dysfunction of pelvic region: Secondary | ICD-10-CM | POA: Diagnosis not present

## 2022-07-27 DIAGNOSIS — M9905 Segmental and somatic dysfunction of pelvic region: Secondary | ICD-10-CM | POA: Diagnosis not present

## 2022-07-27 DIAGNOSIS — M5431 Sciatica, right side: Secondary | ICD-10-CM | POA: Diagnosis not present

## 2022-07-27 DIAGNOSIS — M9903 Segmental and somatic dysfunction of lumbar region: Secondary | ICD-10-CM | POA: Diagnosis not present

## 2022-07-27 DIAGNOSIS — M5416 Radiculopathy, lumbar region: Secondary | ICD-10-CM | POA: Diagnosis not present

## 2022-08-19 DIAGNOSIS — L723 Sebaceous cyst: Secondary | ICD-10-CM | POA: Diagnosis not present

## 2022-08-24 DIAGNOSIS — M9905 Segmental and somatic dysfunction of pelvic region: Secondary | ICD-10-CM | POA: Diagnosis not present

## 2022-08-24 DIAGNOSIS — M5416 Radiculopathy, lumbar region: Secondary | ICD-10-CM | POA: Diagnosis not present

## 2022-08-24 DIAGNOSIS — M5431 Sciatica, right side: Secondary | ICD-10-CM | POA: Diagnosis not present

## 2022-08-24 DIAGNOSIS — M9903 Segmental and somatic dysfunction of lumbar region: Secondary | ICD-10-CM | POA: Diagnosis not present

## 2022-09-16 DIAGNOSIS — L723 Sebaceous cyst: Secondary | ICD-10-CM | POA: Diagnosis not present

## 2022-09-16 DIAGNOSIS — D485 Neoplasm of uncertain behavior of skin: Secondary | ICD-10-CM | POA: Diagnosis not present

## 2022-09-16 HISTORY — PX: IRRIGATION AND DEBRIDEMENT SEBACEOUS CYST: SHX5255

## 2022-09-20 ENCOUNTER — Other Ambulatory Visit: Payer: Self-pay | Admitting: Internal Medicine

## 2022-09-20 DIAGNOSIS — E782 Mixed hyperlipidemia: Secondary | ICD-10-CM

## 2022-09-20 LAB — SURGICAL PATHOLOGY

## 2022-09-21 NOTE — Telephone Encounter (Signed)
Requested Prescriptions  Pending Prescriptions Disp Refills   atorvastatin (LIPITOR) 40 MG tablet [Pharmacy Med Name: ATORVASTATIN TABS '40MG'$ ] 90 tablet 3    Sig: TAKE 1 TABLET DAILY AT 6 P.M.     Cardiovascular:  Antilipid - Statins Failed - 09/20/2022  8:41 PM      Failed - Lipid Panel in normal range within the last 12 months    Cholesterol, Total  Date Value Ref Range Status  03/18/2022 142 100 - 199 mg/dL Final   Cholesterol Piccolo, Waived  Date Value Ref Range Status  11/29/2019 151 <200 mg/dL Final    Comment:                            Desirable                <200                         Borderline High      200- 239                         High                     >239    LDL Chol Calc (NIH)  Date Value Ref Range Status  03/18/2022 74 0 - 99 mg/dL Final   HDL  Date Value Ref Range Status  03/18/2022 47 >39 mg/dL Final   Triglycerides  Date Value Ref Range Status  03/18/2022 119 0 - 149 mg/dL Final   Triglycerides Piccolo,Waived  Date Value Ref Range Status  11/29/2019 128 <150 mg/dL Final    Comment:                            Normal                   <150                         Borderline High     150 - 199                         High                200 - 499                         Very High                >499          Passed - Patient is not pregnant      Passed - Valid encounter within last 12 months    Recent Outpatient Visits           5 months ago Hyperlipidemia, unspecified hyperlipidemia type   [facility] Vigg, Avanti, MD   6 months ago Hyperlipidemia, unspecified hyperlipidemia type   [facility] Vigg, Avanti, MD   9 months ago Sinus pressure   [place], [place] T, NP   1 year ago Need for influenza vaccination   [place] Vigg, Avanti, MD   1 year ago Annual physical exam   [facility] Vigg, Avanti, MD       Future Appointments  In 2 weeks  [name], NP [facility], [place]

## 2022-09-27 ENCOUNTER — Other Ambulatory Visit: Payer: Self-pay

## 2022-09-27 ENCOUNTER — Other Ambulatory Visit: Payer: Self-pay | Admitting: Internal Medicine

## 2022-09-27 DIAGNOSIS — I10 Essential (primary) hypertension: Secondary | ICD-10-CM

## 2022-09-27 MED ORDER — LISINOPRIL 40 MG PO TABS: 40.0000 mg | ORAL_TABLET | Freq: Every day | ORAL | 0 refills | Status: DC

## 2022-09-27 MED ORDER — METOPROLOL SUCCINATE ER 50 MG PO TB24: ORAL_TABLET | ORAL | 0 refills | Status: DC

## 2022-09-27 NOTE — Telephone Encounter (Signed)
Medication Refill - Medication: lisinopril (ZESTRIL) 40 MG tablet [[id number]]   metoprolol succinate (TOPROL-XL) 50 MG 24 hr tablet [[id number]]  Pt will run out before [facility] can [facility] to him so he would like his normal refill sent to [place] and a short 30 day supply sent to [facility] in [place] / please advise     Has the patient contacted their pharmacy? Yes.   (Agent: If no, request that the patient contact the pharmacy for the refill. If patient does not wish to contact the pharmacy document the reason why and proceed with request.) (Agent: If yes, when and what did the pharmacy advise?)pharmacy advised they reach out to office / no response   Preferred Pharmacy (with phone number or street name):  [facility] [place], [place], [place]      Has the patient been seen for an appointment in the last year OR does the patient have an upcoming appointment? Yes.    Agent: Please be advised that RX refills may take up to 3 business days. We ask that you follow-up with your pharmacy.

## 2022-09-27 NOTE — Telephone Encounter (Signed)
Patient last seen by Dr. Neomia Dear in May, has upcoming appointment with Malachy Mood. Patient needs medication sent to mail order so he does not run out.

## 2022-09-27 NOTE — Telephone Encounter (Signed)
Requested Prescriptions  Pending Prescriptions Disp Refills   lisinopril (ZESTRIL) 40 MG tablet 90 tablet 0    Sig: Take 1 tablet (40 mg total) by mouth daily.     Cardiovascular:  ACE Inhibitors Failed - 09/27/2022 11:31 AM      Failed - Last BP in normal range    BP Readings from Last 1 Encounters:  05/10/22 (!) 167/96         Failed - Valid encounter within last 6 months    Recent Outpatient Visits           6 months ago Hyperlipidemia, unspecified hyperlipidemia type   [facility] Vigg, Avanti, MD   6 months ago Hyperlipidemia, unspecified hyperlipidemia type   [place] Vigg, Avanti, MD   9 months ago Sinus pressure   [place], [place] T, NP   1 year ago Need for influenza vaccination   [facility] Vigg, Avanti, MD   1 year ago Annual physical exam   [place] Vigg, Avanti, MD       Future Appointments             In 1 week [name], NP [place], [facility]            Passed - Cr in normal range and within 180 days    Creatinine, Ser  Date Value Ref Range Status  05/10/2022 1.24 0.61 - 1.24 mg/dL Final         Passed - K in normal range and within 180 days    Potassium  Date Value Ref Range Status  05/10/2022 3.5 3.5 - 5.1 mmol/L Final         Passed - Patient is not pregnant       metoprolol succinate (TOPROL-XL) 50 MG 24 hr tablet 90 tablet 0    Sig: TAKE 1 TABLET DAILY, TAKE WITH OR IMMEDIATELY FOLLOWING A MEAL     Cardiovascular:  Beta Blockers Failed - 09/27/2022 11:31 AM      Failed - Last BP in normal range    BP Readings from Last 1 Encounters:  05/10/22 (!) 167/96         Failed - Valid encounter within last 6 months    Recent Outpatient Visits           6 months ago Hyperlipidemia, unspecified hyperlipidemia type   [facility] Vigg, Avanti, MD   6 months ago Hyperlipidemia, unspecified hyperlipidemia type   [place] Vigg, Avanti, MD   9 months ago Sinus pressure   [place], [place] T, NP   1 year ago Need for influenza vaccination   [facility] Vigg, Avanti, MD   1 year ago Annual physical exam   [place], MD       Future Appointments             In 1 week [name], NP [facility], [facility]            Passed - Last Heart Rate in normal range    Pulse Readings from Last 1 Encounters:  05/10/22 69

## 2022-10-04 MED ORDER — LISINOPRIL 40 MG PO TABS
40.0000 mg | ORAL_TABLET | Freq: Every day | ORAL | 1 refills | Status: DC
Start: 2022-10-04 — End: 2022-11-19

## 2022-10-04 MED ORDER — METOPROLOL SUCCINATE ER 50 MG PO TB24: 50.0000 mg | ORAL_TABLET | Freq: Every day | ORAL | 1 refills | Status: DC

## 2022-10-05 ENCOUNTER — Encounter: Payer: Self-pay | Admitting: Unknown Physician Specialty

## 2022-10-05 ENCOUNTER — Ambulatory Visit: Payer: BC Managed Care – PPO | Admitting: Unknown Physician Specialty

## 2022-10-05 VITALS — BP 157/89 | HR 82 | Temp 98.6°F | Ht 67.0 in | Wt 213.7 lb

## 2022-10-05 DIAGNOSIS — R3912 Poor urinary stream: Secondary | ICD-10-CM | POA: Diagnosis not present

## 2022-10-05 DIAGNOSIS — Z0000 Encounter for general adult medical examination without abnormal findings: Secondary | ICD-10-CM

## 2022-10-05 DIAGNOSIS — I10 Essential (primary) hypertension: Secondary | ICD-10-CM

## 2022-10-05 DIAGNOSIS — E669 Obesity, unspecified: Secondary | ICD-10-CM

## 2022-10-05 DIAGNOSIS — N401 Enlarged prostate with lower urinary tract symptoms: Secondary | ICD-10-CM

## 2022-10-05 DIAGNOSIS — Z23 Encounter for immunization: Secondary | ICD-10-CM

## 2022-10-05 NOTE — Progress Notes (Signed)
BP (!) 157/89   Pulse 82   Temp 98.6 F (37 C) (Oral)   Ht '5\' 7"'$  (1.702 m)   Wt 213 lb 11.2 oz (96.9 kg)   SpO2 97%   BMI 33.47 kg/m    Subjective:    Patient ID: Craig Ryan, male    DOB: [birth year], 61 y.o.   MRN: [id number]  HPI: Craig Ryan is a 61 y.o. male  Chief Complaint  Patient presents with   Annual Exam   Hypertension Using medications without difficulty Average home BPs: Not checking.    No problems or lightheadedness No chest pain with exertion or shortness of breath No Edema  Hyperlipidemia Using medications without problems No Muscle aches  Diet compliance: Exercise: Little regular exercise with stretching and strength exercises.    Allergies States this is year round and symptoms "off and on."  Sleep apnea Using CPAP nightly  BPH Taking Flomax     10/05/2022    8:19 AM 03/18/2022    8:34 AM 09/02/2021    8:31 AM 08/28/2020    8:10 AM 05/28/2019    3:38 PM  Depression screen PHQ 2/9  Decreased Interest 0 0 0 0 0  Down, Depressed, Hopeless 0 0 0 0 0  PHQ - 2 Score 0 0 0 0 0  Altered sleeping 0 0 0  1  Tired, decreased energy 0 0 0  0  Change in appetite 0 0 0  1  Feeling bad or failure about yourself  0 0 0  0  Trouble concentrating 0 0 0  1  Moving slowly or fidgety/restless 0 0 0  0  Suicidal thoughts 0 0 0  0  PHQ-9 Score 0 0 0  3  Difficult doing work/chores Not difficult at all Not difficult at all Not difficult at all  Not difficult at all     Family History  Problem Relation Age of Onset   Heart disease Mother    Hyperlipidemia Mother    Lung disease Mother    Heart disease Father    Hyperlipidemia Father    Heart disease Brother    Diabetes Brother    Social History   Socioeconomic History   Marital status: Married    Spouse name: Not on file   Number of children: Not on file   Years of education: Not on file   Highest education level: Not on file  Occupational History   Not on file  Tobacco Use    Smoking status: Never   Smokeless tobacco: Never  Vaping Use   Vaping Use: Never used  Substance and Sexual Activity   Alcohol use: No   Drug use: No   Sexual activity: Not Currently  Other Topics Concern   Not on file  Social History Narrative   Not on file   Social Determinants of Health   Financial Resource Strain: Not on file  Food Insecurity: Not on file  Transportation Needs: Not on file  Physical Activity: Not on file  Stress: Not on file  Social Connections: Not on file  Intimate Partner Violence: Not on file      Relevant past medical, surgical, family and social history reviewed and updated as indicated. Interim medical history since our last visit reviewed. Allergies and medications reviewed and updated.  Review of Systems  Constitutional: Negative.   HENT: Negative.    Respiratory: Negative.    Cardiovascular: Negative.   Genitourinary: Negative.   Neurological: Negative.  Psychiatric/Behavioral: Negative.     Per HPI unless specifically indicated above    Objective:    BP (!) 157/89   Pulse 82   Temp 98.6 F (37 C) (Oral)   Ht '5\' 7"'$  (1.702 m)   Wt 213 lb 11.2 oz (96.9 kg)   SpO2 97%   BMI 33.47 kg/m   Wt Readings from Last 3 Encounters:  10/05/22 213 lb 11.2 oz (96.9 kg)  05/10/22 214 lb 4.6 oz (97.2 kg)  03/30/22 214 lb 3.2 oz (97.2 kg)    Physical Exam Constitutional:      Appearance: He is well-developed.  HENT:     Head: Normocephalic.     Right Ear: Tympanic membrane, ear canal and external ear normal.     Left Ear: Tympanic membrane, ear canal and external ear normal.     Mouth/Throat:     Pharynx: Uvula midline.  Eyes:     Pupils: Pupils are equal, round, and reactive to light.  Cardiovascular:     Rate and Rhythm: Normal rate and regular rhythm.     Heart sounds: Normal heart sounds. No murmur heard.    No friction rub. No gallop.  Pulmonary:     Effort: Pulmonary effort is normal. No respiratory distress.     Breath  sounds: Normal breath sounds.  Abdominal:     General: Bowel sounds are normal. There is no distension.     Palpations: Abdomen is soft.     Tenderness: There is no abdominal tenderness.  Genitourinary:    Penis: Normal.      Prostate: Normal.     Rectum: Normal.     Comments: After shared decision making, pt decided to forego rectal exam and get the PSA Musculoskeletal:        General: Normal range of motion.  Skin:    General: Skin is warm and dry.  Neurological:     Mental Status: He is alert and oriented to person, place, and time.     Deep Tendon Reflexes: Reflexes are normal and symmetric.  Psychiatric:        Behavior: Behavior normal.        Thought Content: Thought content normal.        Judgment: Judgment normal.     Results for orders placed or performed during the hospital encounter of 05/10/22  Urinalysis, Routine w reflex microscopic  Result Value Ref Range   Color, Urine YELLOW (A) YELLOW   APPearance CLEAR (A) CLEAR   Specific Gravity, Urine 1.016 1.005 - 1.030   pH 6.0 5.0 - 8.0   Glucose, UA 50 (A) NEGATIVE mg/dL   Hgb urine dipstick NEGATIVE NEGATIVE   Bilirubin Urine NEGATIVE NEGATIVE   Ketones, ur NEGATIVE NEGATIVE mg/dL   Protein, ur NEGATIVE NEGATIVE mg/dL   Nitrite NEGATIVE NEGATIVE   Leukocytes,Ua NEGATIVE NEGATIVE  CBC  Result Value Ref Range   WBC 6.7 4.0 - 10.5 K/uL   RBC 5.44 4.22 - 5.81 MIL/uL   Hemoglobin 16.4 13.0 - 17.0 g/dL   HCT 49.7 39.0 - 52.0 %   MCV 91.4 80.0 - 100.0 fL   MCH 30.1 26.0 - 34.0 pg   MCHC 33.0 30.0 - 36.0 g/dL   RDW 13.0 11.5 - 15.5 %   Platelets 165 150 - 400 K/uL   nRBC 0.0 0.0 - 0.2 %  Basic metabolic panel  Result Value Ref Range   Sodium 139 135 - 145 mmol/L   Potassium 3.5 3.5 - 5.1 mmol/L  Chloride 112 (H) 98 - 111 mmol/L   CO2 21 (L) 22 - 32 mmol/L   Glucose, Bld 175 (H) 70 - 99 mg/dL   BUN 18 6 - 20 mg/dL   Creatinine, Ser 1.24 0.61 - 1.24 mg/dL   Calcium 9.2 8.9 - 10.3 mg/dL   GFR, Estimated  >60 >60 mL/min   Anion gap 6 5 - 15      Assessment & Plan:   Problem List Items Addressed This Visit       Unprioritized   Benign prostatic hyperplasia with weak urinary stream    Stable on flomax      Relevant Orders   PSA   Essential hypertension    Elevated.  Encouraged to check at home.  Will bring readings back perhaps with diet recall      Obesity (BMI 30-39.9)    Discussed Exercise and diet      Relevant Orders   HgB A1c   Other Visit Diagnoses     Need for influenza vaccination    -  Primary   Relevant Orders   Flu Vaccine QUAD 54moIM (Fluarix, Fluzone & Alfiuria Quad PF) (Completed)   Routine general medical examination at a health care facility       Relevant Orders   CBC with Differential/Platelet   Comprehensive metabolic panel   Lipid Panel w/o Chol/HDL Ratio   TSH   PSA       HM items are up to date  Follow up plan: Return in about 6 weeks (around 11/16/2022).

## 2022-10-05 NOTE — Assessment & Plan Note (Signed)
Stable on flomax.   

## 2022-10-05 NOTE — Assessment & Plan Note (Signed)
Discussed Exercise and diet

## 2022-10-05 NOTE — Assessment & Plan Note (Signed)
Elevated.  Encouraged to check at home.  Will bring readings back perhaps with diet recall

## 2022-10-06 LAB — CBC WITH DIFFERENTIAL/PLATELET
Basophils Absolute: 0.1 10*3/uL (ref 0.0–0.2)
Basos: 1 %
EOS (ABSOLUTE): 0.2 10*3/uL (ref 0.0–0.4)
Eos: 3 %
Hematocrit: 49.9 % (ref 37.5–51.0)
Hemoglobin: 16.5 g/dL (ref 13.0–17.7)
Immature Grans (Abs): 0 10*3/uL (ref 0.0–0.1)
Immature Granulocytes: 0 %
Lymphocytes Absolute: 1.8 10*3/uL (ref 0.7–3.1)
Lymphs: 26 %
MCH: 30.1 pg (ref 26.6–33.0)
MCHC: 33.1 g/dL (ref 31.5–35.7)
MCV: 91 fL (ref 79–97)
Monocytes Absolute: 0.6 10*3/uL (ref 0.1–0.9)
Monocytes: 9 %
Neutrophils Absolute: 4.1 10*3/uL (ref 1.4–7.0)
Neutrophils: 61 %
Platelets: 246 10*3/uL (ref 150–450)
RBC: 5.49 x10E6/uL (ref 4.14–5.80)
RDW: 13.2 % (ref 11.6–15.4)
WBC: 6.7 10*3/uL (ref 3.4–10.8)

## 2022-10-06 LAB — COMPREHENSIVE METABOLIC PANEL
ALT: 34 IU/L (ref 0–44)
AST: 19 IU/L (ref 0–40)
Albumin/Globulin Ratio: 1.4 (ref 1.2–2.2)
Albumin: 4.5 g/dL (ref 3.9–4.9)
Alkaline Phosphatase: 114 IU/L (ref 44–121)
BUN/Creatinine Ratio: 13 (ref 10–24)
BUN: 17 mg/dL (ref 8–27)
Bilirubin Total: 0.4 mg/dL (ref 0.0–1.2)
CO2: 26 mmol/L (ref 20–29)
Calcium: 9.3 mg/dL (ref 8.6–10.2)
Chloride: 104 mmol/L (ref 96–106)
Creatinine, Ser: 1.29 mg/dL — ABNORMAL HIGH (ref 0.76–1.27)
Globulin, Total: 3.2 g/dL (ref 1.5–4.5)
Glucose: 103 mg/dL — ABNORMAL HIGH (ref 70–99)
Potassium: 4 mmol/L (ref 3.5–5.2)
Sodium: 143 mmol/L (ref 134–144)
Total Protein: 7.7 g/dL (ref 6.0–8.5)
eGFR: 63 mL/min/{1.73_m2} (ref >59)

## 2022-10-06 LAB — HEMOGLOBIN A1C
Est. average glucose Bld gHb Est-mCnc: 137 mg/dL
Hgb A1c MFr Bld: 6.4 % — ABNORMAL HIGH (ref 4.8–5.6)

## 2022-10-06 LAB — TSH: TSH: 0.915 u[IU]/mL (ref 0.450–4.500)

## 2022-10-06 LAB — LIPID PANEL W/O CHOL/HDL RATIO
Cholesterol, Total: 167 mg/dL (ref 100–199)
HDL: 52 mg/dL (ref >39)
LDL Chol Calc (NIH): 90 mg/dL (ref 0–99)
Triglycerides: 144 mg/dL (ref 0–149)
VLDL Cholesterol Cal: 25 mg/dL (ref 5–40)

## 2022-10-06 LAB — PSA: Prostate Specific Ag, Serum: 1.1 ng/mL (ref 0.0–4.0)

## 2022-10-20 DIAGNOSIS — M5416 Radiculopathy, lumbar region: Secondary | ICD-10-CM | POA: Diagnosis not present

## 2022-10-20 DIAGNOSIS — M9905 Segmental and somatic dysfunction of pelvic region: Secondary | ICD-10-CM | POA: Diagnosis not present

## 2022-10-20 DIAGNOSIS — M9903 Segmental and somatic dysfunction of lumbar region: Secondary | ICD-10-CM | POA: Diagnosis not present

## 2022-10-20 DIAGNOSIS — M5431 Sciatica, right side: Secondary | ICD-10-CM | POA: Diagnosis not present

## 2022-10-21 ENCOUNTER — Ambulatory Visit: Payer: Self-pay

## 2022-10-21 ENCOUNTER — Encounter: Payer: Self-pay | Admitting: Unknown Physician Specialty

## 2022-10-21 ENCOUNTER — Ambulatory Visit: Payer: BC Managed Care – PPO | Admitting: Physician Assistant

## 2022-10-21 ENCOUNTER — Encounter: Payer: Self-pay | Admitting: Physician Assistant

## 2022-10-21 VITALS — BP 144/82 | HR 75 | Temp 98.5°F | Wt 212.7 lb

## 2022-10-21 DIAGNOSIS — H938X1 Other specified disorders of right ear: Secondary | ICD-10-CM

## 2022-10-21 DIAGNOSIS — I10 Essential (primary) hypertension: Secondary | ICD-10-CM

## 2022-10-21 MED ORDER — AMLODIPINE BESYLATE 10 MG PO TABS: 10.0000 mg | ORAL_TABLET | Freq: Every day | ORAL | 2 refills | Status: DC

## 2022-10-21 NOTE — Telephone Encounter (Signed)
  Chief Complaint: High BP readings - Head congestion Symptoms: Sinus congestion.  Frequency: Ongoing Pertinent Negatives: Patient denies Chest pain neurological issues Disposition: '[]'$ ED /'[]'$ Urgent Care (no appt availability in office) / '[x]'$ Appointment(In office/virtual)/ '[]'$  [facility]/ '[]'$ Home Care/ '[]'$ Refused Recommended Disposition /'[]'$ [facility]/ '[]'$  Follow-up with PCP Additional Notes: PT has been tracking bp readings. Pt states that they have been running in the 140's/80-90. Pt has a right sided sinus congestion that he thinks is from allergies.    Reason for Disposition  Systolic BP  >= 825 OR Diastolic >= 189  Answer Assessment - Initial Assessment Questions 1. BLOOD PRESSURE: "What is the blood pressure?" "Did you take at least two measurements 5 minutes apart?"     140's over 80-90. 160/ 90 2. ONSET: "When did you take your blood pressure?"     Keeps track 3. HOW: "How did you take your blood pressure?" (e.g., automatic home BP monitor, visiting nurse)     Automatic 4. HISTORY: "Do you have a history of high blood pressure?"     yes 5. MEDICINES: "Are you taking any medicines for blood pressure?" "Have you missed any doses recently?"     yes 6. OTHER SYMPTOMS: "Do you have any symptoms?" (e.g., blurred vision, chest pain, difficulty breathing, headache, weakness)     no 7. PREGNANCY: "Is there any chance you are pregnant?" "When was your last menstrual period?"  Protocols used: Blood Pressure - High-A-AH

## 2022-10-21 NOTE — Patient Instructions (Addendum)
Your blood pressure was elevated today.  I am increasing your Amlodipine to 10 mg by mouth once per day  If possible please take your BP at home using an electronic blood pressure cuff for the upper arm Record your blood pressure once per day and bring them back with you to your apt so we can make sure you are not developing high blood pressure.   Incorporating a minimum of 150 minutes (20-30 minutes per day) of moderate intensity physical activity can help improve your heart health and reduce the chances of high blood pressure and other cardiovascular risks. Incorporating a heart healthy diet can also help reduce the chances of heart attack and high cholesterol.

## 2022-10-21 NOTE — Progress Notes (Signed)
Established Patient Office Visit  Name: Craig Ryan   MRN: [id number]    DOB: 1961/02/09   Date:10/25/2022  Today's Provider: Talitha Givens, MHS, PA-C Introduced myself to the patient as a PA-C and provided education on APPs in clinical practice.         Subjective  Chief Complaint  Chief Complaint  Patient presents with   Hypertension    Pt states he has been having elevated BP readings the last few days, 150's over 100's. Patient states that he used the bathroom before coming here and passed a small kidney stone at that time      Hypertension Pertinent negatives include no blurred vision, chest pain, headaches, palpitations or shortness of breath.     Hypertension:  - Medications: Amlodipine 5 mg PO QD, lisinopril 40 mg PO QD, metoprolol 50 mg po QD - Compliance: Excellent  - Checking BP at home: Yes and has noted increased BP readings for the past few days  - Denies any SOB, CP, vision changes, LE edema, medication SEs, or symptoms of hypotension  Patient reports elevated BP readings for the past few days - states today he passed a small kidney stone which may have been causing symptoms Reports he has been drinking a lot of water and using the restroom more frequently        Patient Active Problem List   Diagnosis Date Noted   Sinus pressure 12/11/2021   Benign prostatic hyperplasia with weak urinary stream 03/02/2021   Obesity (BMI 30-39.9) 03/02/2021   Choroidal neovascularization of both eyes due to macular telangiectasis 04/26/2018   Prediabetes 10/04/2016   Hyperlipidemia    Essential hypertension    OSA on CPAP    Nephrolithiasis     Past Surgical History:  Procedure Laterality Date   COLONOSCOPY     COLONOSCOPY WITH PROPOFOL N/A 04/22/2021   Procedure: COLONOSCOPY WITH PROPOFOL;  Surgeon: Robert Bellow, MD;  Location: [facility];  Service: Endoscopy;  Laterality: N/A;   EXTRACORPOREAL SHOCK WAVE LITHOTRIPSY Left 10/16/2015    Procedure: EXTRACORPOREAL SHOCK WAVE LITHOTRIPSY (ESWL);  Surgeon: Royston Cowper, MD;  Location: [facility];  Service: Urology;  Laterality: Left;   EXTRACORPOREAL SHOCK WAVE LITHOTRIPSY Left 10/30/2015   Procedure: EXTRACORPOREAL SHOCK WAVE LITHOTRIPSY (ESWL);  Surgeon: Royston Cowper, MD;  Location: [facility];  Service: Urology;  Laterality: Left;   EXTRACORPOREAL SHOCK WAVE LITHOTRIPSY Left 09/20/2019   Procedure: EXTRACORPOREAL SHOCK WAVE LITHOTRIPSY (ESWL);  Surgeon: Royston Cowper, MD;  Location: [facility];  Service: Urology;  Laterality: Left;   IRRIGATION AND DEBRIDEMENT SEBACEOUS CYST  09/16/2022   LITHOTRIPSY  Feb 2016, 2009    Family History  Problem Relation Age of Onset   Heart disease Mother    Hyperlipidemia Mother    Lung disease Mother    Heart disease Father    Hyperlipidemia Father    Heart disease Brother    Diabetes Brother     Social History   Tobacco Use   Smoking status: Never   Smokeless tobacco: Never  Substance Use Topics   Alcohol use: No     Current Outpatient Medications:    aspirin EC 81 MG tablet, Take 81 mg by mouth daily., Disp: , Rfl:    atorvastatin (LIPITOR) 40 MG tablet, TAKE 1 TABLET DAILY AT 6 P.M., Disp: 90 tablet, Rfl: 3   Cetirizine HCl (ZYRTEC PO), Take by mouth daily., Disp: , Rfl:  fluticasone (FLONASE) 50 MCG/ACT nasal spray, Place 2 sprays into both nostrils daily., Disp: , Rfl:    lisinopril (ZESTRIL) 40 MG tablet, Take 1 tablet (40 mg total) by mouth daily., Disp: 90 tablet, Rfl: 1   metoprolol succinate (TOPROL-XL) 50 MG 24 hr tablet, Take 1 tablet (50 mg total) by mouth daily. Take with or immediately following a meal., Disp: 90 tablet, Rfl: 1   Multiple Vitamin (MULTIVITAMIN) capsule, Take 1 capsule by mouth daily., Disp: , Rfl:    pyridoxine (B-6) 100 MG tablet, Take 100 mg by mouth daily., Disp: , Rfl:    SHINGRIX injection, INJECT 0.5 ML INTO THE MUSCLE ONCE FOR 1 DOSE (DOSE NUMBER 1), Disp: 1 each, Rfl: 0   tamsulosin  (FLOMAX) 0.4 MG CAPS capsule, TAKE 1 CAPSULE DAILY, Disp: 90 capsule, Rfl: 3   VITAMIN D PO, Take by mouth 2 (two) times daily., Disp: , Rfl:    amLODipine (NORVASC) 10 MG tablet, Take 1 tablet (10 mg total) by mouth daily., Disp: 30 tablet, Rfl: 2  No Known Allergies  I personally reviewed active problem list, medication list, allergies, notes from last encounter, lab results with the patient/caregiver today.   Review of Systems  HENT:         Right ear fullness    Eyes:  Negative for blurred vision and double vision.  Respiratory:  Negative for shortness of breath and wheezing.   Cardiovascular:  Negative for chest pain, palpitations and leg swelling.       Elevated BP readings    Neurological:  Negative for dizziness and headaches.      Objective  Vitals:   10/21/22 1511 10/21/22 1520  BP: (!) 148/85 (!) 144/82  Pulse: 74 75  Temp: 98.5 F (36.9 C)   TempSrc: Oral   SpO2: 97%   Weight: 212 lb 11.2 oz (96.5 kg)     Body mass index is 33.31 kg/m.  Physical Exam Vitals reviewed.  Constitutional:      Appearance: Normal appearance.  HENT:     Head: Normocephalic and atraumatic.     Right Ear: Tympanic membrane, ear canal and external ear normal.     Left Ear: Tympanic membrane, ear canal and external ear normal.     Mouth/Throat:     Lips: Pink.     Pharynx: Oropharynx is clear. Uvula midline.  Cardiovascular:     Rate and Rhythm: Normal rate and regular rhythm.     Pulses: Normal pulses.     Heart sounds: Normal heart sounds. No murmur heard.    No friction rub. No gallop.  Pulmonary:     Effort: Pulmonary effort is normal.     Breath sounds: Normal breath sounds. No decreased air movement. No decreased breath sounds, wheezing, rhonchi or rales.  Musculoskeletal:     Cervical back: Normal range of motion and neck supple.     Right lower leg: No edema.     Left lower leg: No edema.  Neurological:     General: No focal deficit present.     Mental Status:  He is alert and oriented to person, place, and time.     GCS: GCS eye subscore is 4. GCS verbal subscore is 5. GCS motor subscore is 6.     Motor: Motor function is intact.  Psychiatric:        Attention and Perception: Attention and perception normal.        Mood and Affect: Mood and affect normal.  Speech: Speech normal.        Behavior: Behavior normal. Behavior is cooperative.        Thought Content: Thought content normal.        Cognition and Memory: Cognition and memory normal.        Judgment: Judgment normal.      Recent Results (from the past 2160 hour(s))  Surgical pathology     Status: None   Collection Time: 09/16/22  9:39 AM  Result Value Ref Range   SURGICAL PATHOLOGY      Surgical Pathology CASE: [id number] PATIENT: Ruthvik Postlewaite Surgical Pathology Report     Specimen Submitted: A. Back Cyst x2  Clinical History: Sebaceous cyst (L72.3)    DIAGNOSIS: A. SKIN AND SUBCUTANEOUS TISSUE, BACK; EXCISION: - EPIDERMAL INCLUSION CYSTS X 2. - NEGATIVE FOR ATYPIA AND MALIGNANCY.  GROSS DESCRIPTION: A. Labeled: Excision back cyst x 2 Received: Formalin Collection time: 9:20 AM on 09/16/2022 Placed into formalin time: 9:20 AM on 09/16/2022 Tissue fragment(s): 2 Size: 1.4 x 1.3 x 0.7 cm and 3.3 x 2.5 x 1.7 cm Description: Received are 2 cysts with overlying skin ellipses.  The first fragment (fragment A) has a 1.4 x 0.6 cm ellipse of tan, smooth, focally hairbearing skin.  There is an underlying focally disrupted cyst, 1 x 0.9 x 0.7 cm, and adjacent yellow lobulated adipose tissue. The other fragment (fragment B) has a 2 x 0.8 cm ellipse of tan finely wrinkled hairbearing skin with an underlying 2.7 x 2.5 x 1.7 cm focally disrupted c yst.  Fragment A is inked blue and fragment B is inked green. Sectioning reveals that the cysts have smooth walls and contain white friable contents. Representative sections (1/cm) are submitted in cassettes 1-3  with fragment A in cassette 1 and fragment B in cassettes 2-3.  RB 09/17/2022   Final Diagnosis performed by Allena Napoleon, MD.   Electronically signed 09/20/2022 9:08:04AM The electronic signature indicates that the named Attending Pathologist has evaluated the specimen Technical component performed at [facility], [address], [place], [place] [address] Lab: [phone] Dir: Rush Farmer, MD, MMM  Professional component performed at [facility], [facility], [place], [place], [place] [address] Lab: [phone] Dir: Kathi Simpers, MD   CBC with Differential/Platelet     Status: None   Collection Time: 10/05/22  9:28 AM  Result Value Ref Range   WBC 6.7 3.4 - 10.8 x10E3/uL   RBC 5.49 4.14 - 5.80 x10E6/uL   Hemoglobin 16.5 13.0 - 17.7 g/dL   Hematocrit 49.9 37.5 - 51.0 %   MCV 91 79 - 97 fL   MCH 30.1 26.6 - 33.0 pg   MCHC 33.1 31.5 - 35.7 g/dL   RDW 13.2 11.6 - 15.4 %   Platelets 246 150 - 450 x10E3/uL   Neutrophils 61 Not Estab. %   Lymphs 26 Not Estab. %   Monocytes 9 Not Estab. %   Eos 3 Not Estab. %   Basos 1 Not Estab. %   Neutrophils Absolute 4.1 1.4 - 7.0 x10E3/uL   Lymphocytes Absolute 1.8 0.7 - 3.1 x10E3/uL   Monocytes Absolute 0.6 0.1 - 0.9 x10E3/uL   EOS (ABSOLUTE) 0.2 0.0 - 0.4 x10E3/uL   Basophils Absolute 0.1 0.0 - 0.2 x10E3/uL   Immature Granulocytes 0 Not Estab. %   Immature Grans (Abs) 0.0 0.0 - 0.1 x10E3/uL  Comprehensive metabolic panel     Status: Abnormal   Collection Time: 10/05/22  9:28 AM  Result Value  Ref Range   Glucose 103 (H) 70 - 99 mg/dL   BUN 17 8 - 27 mg/dL   Creatinine, Ser 1.29 (H) 0.76 - 1.27 mg/dL   eGFR 63 >59 mL/min/1.73   BUN/Creatinine Ratio 13 10 - 24   Sodium 143 134 - 144 mmol/L   Potassium 4.0 3.5 - 5.2 mmol/L   Chloride 104 96 - 106 mmol/L   CO2 26 20 - 29 mmol/L   Calcium 9.3 8.6 - 10.2 mg/dL   Total Protein 7.7 6.0 - 8.5 g/dL   Albumin 4.5 3.9 - 4.9 g/dL   Globulin, Total 3.2 1.5 - 4.5 g/dL    Albumin/Globulin Ratio 1.4 1.2 - 2.2   Bilirubin Total 0.4 0.0 - 1.2 mg/dL   Alkaline Phosphatase 114 44 - 121 IU/L   AST 19 0 - 40 IU/L   ALT 34 0 - 44 IU/L  Lipid Panel w/o Chol/HDL Ratio     Status: None   Collection Time: 10/05/22  9:28 AM  Result Value Ref Range   Cholesterol, Total 167 100 - 199 mg/dL   Triglycerides 144 0 - 149 mg/dL   HDL 52 >39 mg/dL   VLDL Cholesterol Cal 25 5 - 40 mg/dL   LDL Chol Calc (NIH) 90 0 - 99 mg/dL  TSH     Status: None   Collection Time: 10/05/22  9:28 AM  Result Value Ref Range   TSH 0.915 0.450 - 4.500 uIU/mL  PSA     Status: None   Collection Time: 10/05/22  9:28 AM  Result Value Ref Range   Prostate Specific Ag, Serum 1.1 0.0 - 4.0 ng/mL    Comment: Roche ECLIA methodology. According to the American Urological Association, Serum PSA should decrease and remain at undetectable levels after radical prostatectomy. The AUA defines biochemical recurrence as an initial PSA value 0.2 ng/mL or greater followed by a subsequent confirmatory PSA value 0.2 ng/mL or greater. Values obtained with different assay methods or kits cannot be used interchangeably. Results cannot be interpreted as absolute evidence of the presence or absence of malignant disease.   HgB A1c     Status: Abnormal   Collection Time: 10/05/22  9:28 AM  Result Value Ref Range   Hgb A1c MFr Bld 6.4 (H) 4.8 - 5.6 %    Comment:          Prediabetes: 5.7 - 6.4          Diabetes: >6.4          Glycemic control for adults with diabetes: <7.0    Est. average glucose Bld gHb Est-mCnc 137 mg/dL     PHQ2/9:    10/05/2022    8:19 AM 03/18/2022    8:34 AM 09/02/2021    8:31 AM 08/28/2020    8:10 AM 05/28/2019    3:38 PM  Depression screen PHQ 2/9  Decreased Interest 0 0 0 0 0  Down, Depressed, Hopeless 0 0 0 0 0  PHQ - 2 Score 0 0 0 0 0  Altered sleeping 0 0 0  1  Tired, decreased energy 0 0 0  0  Change in appetite 0 0 0  1  Feeling bad or failure about yourself  0 0 0  0   Trouble concentrating 0 0 0  1  Moving slowly or fidgety/restless 0 0 0  0  Suicidal thoughts 0 0 0  0  PHQ-9 Score 0 0 0  3  Difficult doing work/chores Not difficult at  all Not difficult at all Not difficult at all  Not difficult at all      Fall Risk:    10/05/2022    8:19 AM 03/18/2022    8:34 AM 09/02/2021    8:30 AM 08/28/2020    8:09 AM 05/28/2019    3:41 PM  Fall Risk   Falls in the past year? 0 0 0 0 0  Number falls in past yr: 0 0 0 0 0  Injury with Fall? 0 0 0 0 0  Risk for fall due to : No Fall Risks No Fall Risks No Fall Risks No Fall Risks   Follow up _0       Functional Status Survey:      Assessment & Plan  Problem List Items Addressed This Visit       Cardiovascular and Mediastinum   Essential hypertension - Primary    Chronic, ongoing condition Reports excellent compliance with medication regimen comprised of Amlodipine 5 mg PO QD, lisinopril 40 mg PO QD, and Metoprolol 50 mg PO QD Reports elevated BP readings for the past few days - reviewed these with him as well as last few office visit vitals which demonstrate BP measures above goal Recommend we increase Amlodipine to 10 mg PO QD for management - he is amenable to taking two 5 mg tablets until he has finished current supply then picking up new 10 mg tablets Recommend follow up in 4-6 weeks to assess response to increased Amlodipine strength or sooner if concerns arise.       Relevant Medications   amLODipine (NORVASC) 10 MG tablet   Other Visit Diagnoses     Sensation of fullness in right ear     Discussed using antihistamines and Flonase to assist with fullness sensation TM appears intact and normal in appearance on PE  Follow up as needed- may need referral to ENT for persistent symptoms         Return in about 6 weeks (around 12/02/2022) for elevated BP  recheck - amlodipine increased .   I, Elier Zellars E Bernisha Verma, PA-C, have reviewed all documentation for this visit. The documentation on 10/25/22 for the exam, diagnosis, procedures, and orders are all accurate and complete.   Talitha Givens, MHS, PA-C [facility]

## 2022-10-25 NOTE — Assessment & Plan Note (Signed)
Chronic, ongoing condition Reports excellent compliance with medication regimen comprised of Amlodipine 5 mg PO QD, lisinopril 40 mg PO QD, and Metoprolol 50 mg PO QD Reports elevated BP readings for the past few days - reviewed these with him as well as last few office visit vitals which demonstrate BP measures above goal Recommend we increase Amlodipine to 10 mg PO QD for management - he is amenable to taking two 5 mg tablets until he has finished current supply then picking up new 10 mg tablets Recommend follow up in 4-6 weeks to assess response to increased Amlodipine strength or sooner if concerns arise.

## 2022-11-11 ENCOUNTER — Encounter: Payer: Self-pay | Admitting: Physician Assistant

## 2022-11-11 ENCOUNTER — Telehealth: Payer: BC Managed Care – PPO | Admitting: Physician Assistant

## 2022-11-11 DIAGNOSIS — J329 Chronic sinusitis, unspecified: Secondary | ICD-10-CM | POA: Diagnosis not present

## 2022-11-11 DIAGNOSIS — J40 Bronchitis, not specified as acute or chronic: Secondary | ICD-10-CM | POA: Diagnosis not present

## 2022-11-11 MED ORDER — ALBUTEROL SULFATE HFA 108 (90 BASE) MCG/ACT IN AERS: 1.0000 | INHALATION_SPRAY | Freq: Four times a day (QID) | RESPIRATORY_TRACT | 0 refills | Status: AC | PRN

## 2022-11-11 MED ORDER — PREDNISONE 10 MG (21) PO TBPK: ORAL_TABLET | ORAL | 0 refills | Status: DC

## 2022-11-11 MED ORDER — BENZONATATE 100 MG PO CAPS: 100.0000 mg | ORAL_CAPSULE | Freq: Three times a day (TID) | ORAL | 0 refills | Status: DC | PRN

## 2022-11-11 MED ORDER — PROMETHAZINE-DM 6.25-15 MG/5ML PO SYRP: 5.0000 mL | ORAL_SOLUTION | Freq: Four times a day (QID) | ORAL | 0 refills | Status: DC | PRN

## 2022-11-11 MED ORDER — AMOXICILLIN-POT CLAVULANATE 875-125 MG PO TABS: 1.0000 | ORAL_TABLET | Freq: Two times a day (BID) | ORAL | 0 refills | Status: DC

## 2022-11-11 NOTE — Patient Instructions (Signed)
Conley Simmonds, thank you for joining Mar Daring, PA-C for today's virtual visit.  While this provider is not your primary care provider (PCP), if your PCP is located in our provider database this encounter information will be shared with them immediately following your visit.   [place] account gives you access to today's visit and all your visits, tests, and labs performed at [facility] " click here if you don't have a [facility] account or go to [email or web address]  Consent: (Patient) KHYRI HINZMAN provided verbal consent for this virtual visit at the beginning of the encounter.  Current Medications:  Current Outpatient Medications:    albuterol (VENTOLIN HFA) 108 (90 Base) MCG/ACT inhaler, Inhale 1-2 puffs into the lungs every 6 (six) hours as needed., Disp: 8 g, Rfl: 0   amoxicillin-clavulanate (AUGMENTIN) 875-125 MG tablet, Take 1 tablet by mouth 2 (two) times daily., Disp: 20 tablet, Rfl: 0   benzonatate (TESSALON) 100 MG capsule, Take 1 capsule (100 mg total) by mouth 3 (three) times daily as needed., Disp: 30 capsule, Rfl: 0   predniSONE (STERAPRED UNI-PAK 21 TAB) 10 MG (21) TBPK tablet, 6 day taper; take as directed on package instructions, Disp: 21 tablet, Rfl: 0   promethazine-dextromethorphan (PROMETHAZINE-DM) 6.25-15 MG/5ML syrup, Take 5 mLs by mouth 4 (four) times daily as needed., Disp: 118 mL, Rfl: 0   amLODipine (NORVASC) 10 MG tablet, Take 1 tablet (10 mg total) by mouth daily., Disp: 30 tablet, Rfl: 2   aspirin EC 81 MG tablet, Take 81 mg by mouth daily., Disp: , Rfl:    atorvastatin (LIPITOR) 40 MG tablet, TAKE 1 TABLET DAILY AT 6 P.M., Disp: 90 tablet, Rfl: 3   Cetirizine HCl (ZYRTEC PO), Take by mouth daily., Disp: , Rfl:    fluticasone (FLONASE) 50 MCG/ACT nasal spray, Place 2 sprays into both nostrils daily., Disp: , Rfl:    lisinopril (ZESTRIL) 40 MG tablet, Take 1 tablet (40 mg total) by mouth daily., Disp: 90  tablet, Rfl: 1   metoprolol succinate (TOPROL-XL) 50 MG 24 hr tablet, Take 1 tablet (50 mg total) by mouth daily. Take with or immediately following a meal., Disp: 90 tablet, Rfl: 1   Multiple Vitamin (MULTIVITAMIN) capsule, Take 1 capsule by mouth daily., Disp: , Rfl:    pyridoxine (B-6) 100 MG tablet, Take 100 mg by mouth daily., Disp: , Rfl:    SHINGRIX injection, INJECT 0.5 ML INTO THE MUSCLE ONCE FOR 1 DOSE (DOSE NUMBER 1), Disp: 1 each, Rfl: 0   tamsulosin (FLOMAX) 0.4 MG CAPS capsule, TAKE 1 CAPSULE DAILY, Disp: 90 capsule, Rfl: 3   VITAMIN D PO, Take by mouth 2 (two) times daily., Disp: , Rfl:    Medications ordered in this encounter:  Meds ordered this encounter  Medications   albuterol (VENTOLIN HFA) 108 (90 Base) MCG/ACT inhaler    Sig: Inhale 1-2 puffs into the lungs every 6 (six) hours as needed.    Dispense:  8 g    Refill:  0    Order Specific Question:   Supervising Provider    Answer:   Chase Picket [id number]   amoxicillin-clavulanate (AUGMENTIN) 875-125 MG tablet    Sig: Take 1 tablet by mouth 2 (two) times daily.    Dispense:  20 tablet    Refill:  0    Order Specific Question:   Supervising Provider    Answer:   Chase Picket [id number]   predniSONE (STERAPRED UNI-PAK 21  TAB) 10 MG (21) TBPK tablet    Sig: 6 day taper; take as directed on package instructions    Dispense:  21 tablet    Refill:  0    Order Specific Question:   Supervising Provider    Answer:   Chase Picket [[id number]]   promethazine-dextromethorphan (PROMETHAZINE-DM) 6.25-15 MG/5ML syrup    Sig: Take 5 mLs by mouth 4 (four) times daily as needed.    Dispense:  118 mL    Refill:  0    Order Specific Question:   Supervising Provider    Answer:   Chase Picket [[id number]]   benzonatate (TESSALON) 100 MG capsule    Sig: Take 1 capsule (100 mg total) by mouth 3 (three) times daily as needed.    Dispense:  30 capsule    Refill:  0    Order Specific Question:   Supervising Provider     Answer:   Chase Picket [id number]     *If you need refills on other medications prior to your next appointment, please contact your pharmacy*  Follow-Up: Call back or seek an in-person evaluation if the symptoms worsen or if the condition fails to improve as anticipated.  [place] [phone]  Other Instructions  Sinus Infection, Adult A sinus infection is soreness and swelling (inflammation) of your sinuses. Sinuses are hollow spaces in the bones around your face. They are located: Around your eyes. In the middle of your forehead. Behind your nose. In your cheekbones. Your sinuses and nasal passages are lined with a fluid called mucus. Mucus drains out of your sinuses. Swelling can trap mucus in your sinuses. This lets germs (bacteria, virus, or fungus) grow, which leads to infection. Most of the time, this condition is caused by a virus. What are the causes? Allergies. Asthma. Germs. Things that block your nose or sinuses. Growths in the nose (nasal polyps). Chemicals or irritants in the air. A fungus. This is rare. What increases the risk? Having a weak body defense system (immune system). Doing a lot of swimming or diving. Using nasal sprays too much. Smoking. What are the signs or symptoms? The main symptoms of this condition are pain and a feeling of pressure around the sinuses. Other symptoms include: Stuffy nose (congestion). This may make it hard to breathe through your nose. Runny nose (drainage). Soreness, swelling, and warmth in the sinuses. A cough that may get worse at night. Being unable to smell and taste. Mucus that collects in the throat or the back of the nose (postnasal drip). This may cause a sore throat or bad breath. Being very tired (fatigued). A fever. How is this diagnosed? Your symptoms. Your medical history. A physical exam. Tests to find out if your condition is short-term (acute) or long-term (chronic). Your doctor  may: Check your nose for growths (polyps). Check your sinuses using a tool that has a light on one end (endoscope). Check for allergies or germs. Do imaging tests, such as an MRI or CT scan. How is this treated? Treatment for this condition depends on the cause and whether it is short-term or long-term. If caused by a virus, your symptoms should go away on their own within 10 days. You may be given medicines to relieve symptoms. They include: Medicines that shrink swollen tissue in the nose. A spray that treats swelling of the nostrils. Rinses that help get rid of thick mucus in your nose (nasal saline washes). Medicines that treat allergies (antihistamines).  Over-the-counter pain relievers. If caused by bacteria, your doctor may wait to see if you will get better without treatment. You may be given antibiotic medicine if you have: A very bad infection. A weak body defense system. If caused by growths in the nose, surgery may be needed. Follow these instructions at home: Medicines Take, use, or apply over-the-counter and prescription medicines only as told by your doctor. These may include nasal sprays. If you were prescribed an antibiotic medicine, take it as told by your doctor. Do not stop taking it even if you start to feel better. Hydrate and humidify  Drink enough water to keep your pee (urine) pale yellow. Use a cool mist humidifier to keep the humidity level in your home above 50%. Breathe in steam for 10-15 minutes, 3-4 times a day, or as told by your doctor. You can do this in the bathroom while a hot shower is running. Try not to spend time in cool or dry air. Rest Rest as much as you can. Sleep with your head raised (elevated). Make sure you get enough sleep each night. General instructions  Put a warm, moist washcloth on your face 3-4 times a day, or as often as told by your doctor. Use nasal saline washes as often as told by your doctor. Wash your hands often with  soap and water. If you cannot use soap and water, use hand sanitizer. Do not smoke. Avoid being around people who are smoking (secondhand smoke). Keep all follow-up visits. Contact a doctor if: You have a fever. Your symptoms get worse. Your symptoms do not get better within 10 days. Get help right away if: You have a very bad headache. You cannot stop vomiting. You have very bad pain or swelling around your face or eyes. You have trouble seeing. You feel confused. Your neck is stiff. You have trouble breathing. These symptoms may be an emergency. Get help right away. Call 911. Do not wait to see if the symptoms will go away. Do not drive yourself to the hospital. Summary A sinus infection is swelling of your sinuses. Sinuses are hollow spaces in the bones around your face. This condition is caused by tissues in your nose that become inflamed or swollen. This traps germs. These can lead to infection. If you were prescribed an antibiotic medicine, take it as told by your doctor. Do not stop taking it even if you start to feel better. Keep all follow-up visits. This information is not intended to replace advice given to you by your health care provider. Make sure you discuss any questions you have with your health care provider. Document Revised: 09/29/2021 Document Reviewed: 09/29/2021 Elsevier Patient Education  [place].   Acute Bronchitis, Adult  Acute bronchitis is when air tubes in the lungs (bronchi) suddenly get swollen. The condition can make it hard for you to breathe. In adults, acute bronchitis usually goes away within 2 weeks. A cough caused by bronchitis may last up to 3 weeks. Smoking, allergies, and asthma can make the condition worse. What are the causes? Germs that cause cold and flu (viruses). The most common cause of this condition is the virus that causes the common cold. Bacteria. Substances that bother (irritate) the lungs, including: Smoke from  cigarettes and other types of tobacco. Dust and pollen. Fumes from chemicals, gases, or burned fuel. Indoor or outdoor air pollution. What increases the risk? A weak body's defense system. This is also called the immune system. Any condition that  affects your lungs and breathing, such as asthma. What are the signs or symptoms? A cough. Coughing up clear, yellow, or green mucus. Making high-pitched whistling sounds when you breathe, most often when you breathe out (wheezing). Runny or stuffy nose. Having too much mucus in your lungs (chest congestion). Shortness of breath. Body aches. A sore throat. How is this treated? Acute bronchitis may go away over time without treatment. Your doctor may tell you to: Drink more fluids. This will help thin your mucus so it is easier to cough up. Use a device that gets medicine into your lungs (inhaler). Use a vaporizer or a humidifier. These are machines that add water to the air. This helps with coughing and poor breathing. Take a medicine that thins mucus and helps clear it from your lungs. Take a medicine that prevents or stops coughing. It is not common to take an antibiotic medicine for this condition. Follow these instructions at home:  Take over-the-counter and prescription medicines only as told by your doctor. Use an inhaler, vaporizer, or humidifier as told by your doctor. Take two teaspoons (10 mL) of honey at bedtime. This helps lessen your coughing at night. Drink enough fluid to keep your pee (urine) pale yellow. Do not smoke or use any products that contain nicotine or tobacco. If you need help quitting, ask your doctor. Get a lot of rest. Return to your normal activities when your doctor says that it is safe. Keep all follow-up visits. How is this prevented?  Wash your hands often with soap and water for at least 20 seconds. If you cannot use soap and water, use hand sanitizer. Avoid contact with people who have cold  symptoms. Try not to touch your mouth, nose, or eyes with your hands. Avoid breathing in smoke or chemical fumes. Make sure to get the flu shot every year. Contact a doctor if: Your symptoms do not get better in 2 weeks. You have trouble coughing up the mucus. Your cough keeps you awake at night. You have a fever. Get help right away if: You cough up blood. You have chest pain. You have very bad shortness of breath. You faint or keep feeling like you are going to faint. You have a very bad headache. Your fever or chills get worse. These symptoms may be an emergency. Get help right away. Call your local emergency services (911 in the [place] Do not wait to see if the symptoms will go away. Do not drive yourself to the hospital. Summary Acute bronchitis is when air tubes in the lungs (bronchi) suddenly get swollen. In adults, acute bronchitis usually goes away within 2 weeks. Drink more fluids. This will help thin your mucus so it is easier to cough up. Take over-the-counter and prescription medicines only as told by your doctor. Contact a doctor if your symptoms do not improve after 2 weeks of treatment. This information is not intended to replace advice given to you by your health care provider. Make sure you discuss any questions you have with your health care provider. Document Revised: 02/25/2021 Document Reviewed: 02/25/2021 Elsevier Patient Education  [place].    If you have been instructed to have an in-person evaluation today at a local Urgent Care facility, please use the link below. It will take you to a list of all of our available [facility], including address, phone number and hours of operation. Please do not delay care.  [facility]  If you or  a family member do not have a primary care provider, use the link below to schedule a visit and establish care. When you choose a [facility] primary care physician or advanced practice  provider, you gain a long-term partner in health. Find a Primary Care Provider  Learn more about [facility] in-office and virtual care options: [facility]

## 2022-11-11 NOTE — Progress Notes (Signed)
Virtual Visit Consent   Craig Ryan, you are scheduled for a virtual visit with a [facility] provider today. Just as with appointments in the office, your consent must be obtained to participate. Your consent will be active for this visit and any virtual visit you may have with one of our providers in the next 365 days. If you have a MyChart account, a copy of this consent can be sent to you electronically.  As this is a virtual visit, video technology does not allow for your provider to perform a traditional examination. This may limit your provider's ability to fully assess your condition. If your provider identifies any concerns that need to be evaluated in person or the need to arrange testing (such as labs, EKG, etc.), we will make arrangements to do so. Although advances in technology are sophisticated, we cannot ensure that it will always work on either your end or our end. If the connection with a video visit is poor, the visit may have to be switched to a telephone visit. With either a video or telephone visit, we are not always able to ensure that we have a secure connection.  By engaging in this virtual visit, you consent to the provision of healthcare and authorize for your insurance to be billed (if applicable) for the services provided during this visit. Depending on your insurance coverage, you may receive a charge related to this service.  I need to obtain your verbal consent now. Are you willing to proceed with your visit today? Craig Ryan has provided verbal consent on 11/11/2022 for a virtual visit (video or telephone). Mar Daring, PA-C  Date: 11/11/2022 11:31 AM  Virtual Visit via Video Note   I, Mar Daring, connected with  Craig Ryan  ([id number], 19-Dec-1960) on 11/11/22 at 11:30 AM EST by a video-enabled telemedicine application and verified that I am speaking with the correct person using two identifiers.  Location: Patient: Virtual Visit  Location Patient: Home Provider: Virtual Visit Location Provider: Home Office   I discussed the limitations of evaluation and management by telemedicine and the availability of in person appointments. The patient expressed understanding and agreed to proceed.    History of Present Illness: Craig Ryan is a 62 y.o. who identifies as a male who was assigned male at birth, and is being seen today for URI symptoms.  HPI: URI  This is a new problem. The current episode started 1 to 4 weeks ago (2 weeks; had improvement but has worsened over the last few days). The problem has been gradually worsening. There has been no fever. Associated symptoms include congestion, coughing, headaches, a plugged ear sensation (right side), rhinorrhea (and post nasal drainage) and sinus pain (right side is worse). Pertinent negatives include no diarrhea, ear pain, nausea, sore throat or vomiting. Associated symptoms comments: Myalgias early on, now improved. Treatments tried: mucinex, nyquil hbp. The treatment provided no relief.     Problems:  Patient Active Problem List   Diagnosis Date Noted   Sinus pressure 12/11/2021   Benign prostatic hyperplasia with weak urinary stream 03/02/2021   Obesity (BMI 30-39.9) 03/02/2021   Choroidal neovascularization of both eyes due to macular telangiectasis 04/26/2018   Prediabetes 10/04/2016   Hyperlipidemia    Essential hypertension    OSA on CPAP    Nephrolithiasis     Allergies: No Known Allergies Medications:  Current Outpatient Medications:    albuterol (VENTOLIN HFA) 108 (90 Base) MCG/ACT inhaler, Inhale 1-2  puffs into the lungs every 6 (six) hours as needed., Disp: 8 g, Rfl: 0   amoxicillin-clavulanate (AUGMENTIN) 875-125 MG tablet, Take 1 tablet by mouth 2 (two) times daily., Disp: 20 tablet, Rfl: 0   benzonatate (TESSALON) 100 MG capsule, Take 1 capsule (100 mg total) by mouth 3 (three) times daily as needed., Disp: 30 capsule, Rfl: 0   predniSONE  (STERAPRED UNI-PAK 21 TAB) 10 MG (21) TBPK tablet, 6 day taper; take as directed on package instructions, Disp: 21 tablet, Rfl: 0   promethazine-dextromethorphan (PROMETHAZINE-DM) 6.25-15 MG/5ML syrup, Take 5 mLs by mouth 4 (four) times daily as needed., Disp: 118 mL, Rfl: 0   amLODipine (NORVASC) 10 MG tablet, Take 1 tablet (10 mg total) by mouth daily., Disp: 30 tablet, Rfl: 2   aspirin EC 81 MG tablet, Take 81 mg by mouth daily., Disp: , Rfl:    atorvastatin (LIPITOR) 40 MG tablet, TAKE 1 TABLET DAILY AT 6 P.M., Disp: 90 tablet, Rfl: 3   Cetirizine HCl (ZYRTEC PO), Take by mouth daily., Disp: , Rfl:    fluticasone (FLONASE) 50 MCG/ACT nasal spray, Place 2 sprays into both nostrils daily., Disp: , Rfl:    lisinopril (ZESTRIL) 40 MG tablet, Take 1 tablet (40 mg total) by mouth daily., Disp: 90 tablet, Rfl: 1   metoprolol succinate (TOPROL-XL) 50 MG 24 hr tablet, Take 1 tablet (50 mg total) by mouth daily. Take with or immediately following a meal., Disp: 90 tablet, Rfl: 1   Multiple Vitamin (MULTIVITAMIN) capsule, Take 1 capsule by mouth daily., Disp: , Rfl:    pyridoxine (B-6) 100 MG tablet, Take 100 mg by mouth daily., Disp: , Rfl:    SHINGRIX injection, INJECT 0.5 ML INTO THE MUSCLE ONCE FOR 1 DOSE (DOSE NUMBER 1), Disp: 1 each, Rfl: 0   tamsulosin (FLOMAX) 0.4 MG CAPS capsule, TAKE 1 CAPSULE DAILY, Disp: 90 capsule, Rfl: 3   VITAMIN D PO, Take by mouth 2 (two) times daily., Disp: , Rfl:   Observations/Objective: Patient is well-developed, well-nourished in no acute distress.  Resting comfortably  at home.  Head is normocephalic, atraumatic.  No labored breathing.  Speech is clear and coherent with logical content.  Patient is alert and oriented at baseline.    Assessment and Plan: 1. Sinobronchitis - albuterol (VENTOLIN HFA) 108 (90 Base) MCG/ACT inhaler; Inhale 1-2 puffs into the lungs every 6 (six) hours as needed.  Dispense: 8 g; Refill: 0 - amoxicillin-clavulanate (AUGMENTIN)  875-125 MG tablet; Take 1 tablet by mouth 2 (two) times daily.  Dispense: 20 tablet; Refill: 0 - predniSONE (STERAPRED UNI-PAK 21 TAB) 10 MG (21) TBPK tablet; 6 day taper; take as directed on package instructions  Dispense: 21 tablet; Refill: 0 - promethazine-dextromethorphan (PROMETHAZINE-DM) 6.25-15 MG/5ML syrup; Take 5 mLs by mouth 4 (four) times daily as needed.  Dispense: 118 mL; Refill: 0 - benzonatate (TESSALON) 100 MG capsule; Take 1 capsule (100 mg total) by mouth 3 (three) times daily as needed.  Dispense: 30 capsule; Refill: 0  - Worsening symptoms that have not responded to OTC medications.  - Will give Augmentin, Prednisone, Albuterol, Promethazine DM and Tessalon perles - Continue Mucinex (plain) - Steam and humidifier can help - Stay well hydrated and get plenty of rest.  - Seek in person evaluation if no symptom improvement or if symptoms worsen   Follow Up Instructions: I discussed the assessment and treatment plan with the patient. The patient was provided an opportunity to ask questions and all were answered.  The patient agreed with the plan and demonstrated an understanding of the instructions.  A copy of instructions were sent to the patient via MyChart unless otherwise noted below.    The patient was advised to call back or seek an in-person evaluation if the symptoms worsen or if the condition fails to improve as anticipated.  Time:  I spent 10 minutes with the patient via telehealth technology discussing the above problems/concerns.    Mar Daring, PA-C

## 2022-11-16 NOTE — Patient Instructions (Signed)

## 2022-11-19 ENCOUNTER — Other Ambulatory Visit: Payer: Self-pay | Admitting: Nurse Practitioner

## 2022-11-19 ENCOUNTER — Encounter: Payer: Self-pay | Admitting: Nurse Practitioner

## 2022-11-19 ENCOUNTER — Ambulatory Visit: Payer: BC Managed Care – PPO | Admitting: Nurse Practitioner

## 2022-11-19 VITALS — BP 145/93 | HR 86 | Temp 98.4°F | Ht 67.01 in | Wt 212.6 lb

## 2022-11-19 DIAGNOSIS — J329 Chronic sinusitis, unspecified: Secondary | ICD-10-CM | POA: Insufficient documentation

## 2022-11-19 DIAGNOSIS — J0110 Acute frontal sinusitis, unspecified: Secondary | ICD-10-CM | POA: Diagnosis not present

## 2022-11-19 DIAGNOSIS — I10 Essential (primary) hypertension: Secondary | ICD-10-CM

## 2022-11-19 MED ORDER — AMLODIPINE-VALSARTAN-HCTZ 10-160-12.5 MG PO TABS
1.0000 | ORAL_TABLET | Freq: Every day | ORAL | 4 refills | Status: DC
Start: 2022-11-19 — End: 2022-11-19

## 2022-11-19 MED ORDER — HYDROCHLOROTHIAZIDE 12.5 MG PO TABS: 12.5000 mg | ORAL_TABLET | Freq: Every day | ORAL | 4 refills | Status: DC

## 2022-11-19 MED ORDER — DOXYCYCLINE HYCLATE 100 MG PO TABS
100.0000 mg | ORAL_TABLET | Freq: Two times a day (BID) | ORAL | 0 refills | Status: AC
Start: 2022-11-19 — End: 2022-11-26

## 2022-11-19 MED ORDER — MONTELUKAST SODIUM 10 MG PO TABS: 10.0000 mg | ORAL_TABLET | Freq: Every day | ORAL | 3 refills | Status: DC

## 2022-11-19 MED ORDER — VALSARTAN 160 MG PO TABS: 160.0000 mg | ORAL_TABLET | Freq: Every day | ORAL | 3 refills | Status: DC

## 2022-11-19 NOTE — Assessment & Plan Note (Signed)
Chronic, ongoing.  BP remaining above goal.  Will trial a change to Amlodipine-Valsartan-HCTZ combo pill 10-160-12.5 MG dosing + continue Metoprolol.  Stop Lisinopril.  Combo pill to minimize pill burden.  Educated patient on these changes and medications.  Recommend he monitor BP at least a few mornings a week at home and document.  DASH diet at home.  Labs today: none repeat next visit.  Return in 2 weeks.

## 2022-11-19 NOTE — Progress Notes (Signed)
BP (!) 145/93   Pulse 86   Temp 98.4 F (36.9 C) (Oral)   Ht 5' 7.01" (1.702 m)   Wt 212 lb 9.6 oz (96.4 kg)   SpO2 98%   BMI 33.29 kg/m    Subjective:    Patient ID: Craig Ryan, male    DOB: 01-20-1961, 62 y.o.   MRN: [id number]  HPI: Craig Ryan is a 62 y.o. male  Chief Complaint  Patient presents with   Hypertension   Sinobronchitis    Was seen on 1/4 by Joette Catching, still having pressure in his right ear with ear pain   HYPERTENSION  At visit on 10/21/22 Amlodipine was increased to 10 MG + continues Lisinopril 40 MG and Metoprolol XL 50 MG daily. Hypertension status: stable  Satisfied with current treatment? yes Duration of hypertension: chronic BP monitoring frequency:  daily BP range: 130-150/80 range BP medication side effects:  no Medication compliance: good compliance Aspirin: no Recurrent headaches: no Visual changes: no Palpitations: no Dyspnea: no Chest pain: no Lower extremity edema: no Dizzy/lightheaded: no  The 10-year ASCVD risk score (Arnett DK, et al., 2019) is: 11.2%   Values used to calculate the score:     Age: 27 years     Sex: Male     Is Non-Hispanic African American: No     Diabetic: No     Tobacco smoker: No     Systolic Blood Pressure: 409 mmHg     Is BP treated: Yes     HDL Cholesterol: 52 mg/dL     Total Cholesterol: 167 mg/dL   UPPER RESPIRATORY TRACT INFECTION Having recurrent sinus issues recently.  Last week had video call and was placed on Prednisone, Tessalon, Augmentin.  Prior to recent episodes had not been sick for some time -- since Covid 2021.  Currently is taking Zytec -- has been on for awhile.  Uses Flonase.  Ongoing for 3 weeks. Fever: no - initially a little bit Cough: yes Shortness of breath: no Wheezing: no Chest pain: no Chest tightness: no Chest congestion: no Nasal congestion: yes Runny nose: yes Post nasal drip: yes Sneezing: yes initially Sore throat: no Swollen glands: no Sinus  pressure: yes right side Headache: yes right side Face pain: no Toothache: yes on right side Ear pain: yes "right Ear pressure: yes "right Eyes red/itching:no Eye drainage/crusting: no  Vomiting: no Rash: no Fatigue: yes Sick contacts: yes Strep contacts: no  Context: fluctuating Recurrent sinusitis:  currently recurrence with no improvement Relief with OTC cold/cough medications: no  Treatments attempted: cold/sinus, mucinex, cough syrup, and antibiotics    Relevant past medical, surgical, family and social history reviewed and updated as indicated. Interim medical history since our last visit reviewed. Allergies and medications reviewed and updated.  Review of Systems  Constitutional:  Positive for fatigue. Negative for activity change, appetite change, chills, diaphoresis and fever.  HENT:  Positive for congestion, postnasal drip, rhinorrhea, sinus pressure and sinus pain. Negative for ear discharge, ear pain, sneezing, sore throat and voice change.   Respiratory:  Positive for cough. Negative for chest tightness, shortness of breath and wheezing.   Cardiovascular:  Negative for chest pain, palpitations and leg swelling.  Gastrointestinal: Negative.   Neurological:  Positive for headaches.  Psychiatric/Behavioral: Negative.      Per HPI unless specifically indicated above     Objective:    BP (!) 145/93   Pulse 86   Temp 98.4 F (36.9 C) (Oral)  Ht 5' 7.01" (1.702 m)   Wt 212 lb 9.6 oz (96.4 kg)   SpO2 98%   BMI 33.29 kg/m   Wt Readings from Last 3 Encounters:  11/19/22 212 lb 9.6 oz (96.4 kg)  10/21/22 212 lb 11.2 oz (96.5 kg)  10/05/22 213 lb 11.2 oz (96.9 kg)    Physical Exam Vitals and nursing note reviewed.  Constitutional:      General: He is awake. He is not in acute distress.    Appearance: He is well-developed and well-groomed. He is obese. He is not ill-appearing or toxic-appearing.  HENT:     Head: Normocephalic.     Right Ear: Hearing, ear  canal and external ear normal. No tenderness. A middle ear effusion is present. Tympanic membrane is not injected or perforated.     Left Ear: Hearing, ear canal and external ear normal. No tenderness. A middle ear effusion is present. Tympanic membrane is not injected or perforated.     Nose: Rhinorrhea present. Rhinorrhea is clear.     Right Sinus: No maxillary sinus tenderness or frontal sinus tenderness.     Left Sinus: No maxillary sinus tenderness or frontal sinus tenderness.     Mouth/Throat:     Mouth: Mucous membranes are moist.     Pharynx: Posterior oropharyngeal erythema present. No pharyngeal swelling or oropharyngeal exudate.  Eyes:     General: Lids are normal.     Extraocular Movements: Extraocular movements intact.     Conjunctiva/sclera: Conjunctivae normal.  Neck:     Thyroid: No thyromegaly.     Vascular: No carotid bruit.  Cardiovascular:     Rate and Rhythm: Normal rate and regular rhythm.     Heart sounds: Normal heart sounds.  Pulmonary:     Effort: No accessory muscle usage or respiratory distress.     Breath sounds: Normal breath sounds.  Abdominal:     General: Bowel sounds are normal. There is no distension.     Palpations: Abdomen is soft.     Tenderness: There is no abdominal tenderness.  Musculoskeletal:     Cervical back: Full passive range of motion without pain.     Right lower leg: No edema.     Left lower leg: No edema.  Lymphadenopathy:     Cervical: No cervical adenopathy.  Skin:    General: Skin is warm.     Capillary Refill: Capillary refill takes less than 2 seconds.  Neurological:     Mental Status: He is alert and oriented to person, place, and time.     Deep Tendon Reflexes: Reflexes are normal and symmetric.     Reflex Scores:      Brachioradialis reflexes are 2+ on the right side and 2+ on the left side.      Patellar reflexes are 2+ on the right side and 2+ on the left side. Psychiatric:        Attention and Perception: Attention  normal.        Mood and Affect: Mood normal.        Speech: Speech normal.        Behavior: Behavior normal. Behavior is cooperative.        Thought Content: Thought content normal.     Results for orders placed or performed in visit on 10/05/22  CBC with Differential/Platelet  Result Value Ref Range   WBC 6.7 3.4 - 10.8 x10E3/uL   RBC 5.49 4.14 - 5.80 x10E6/uL   Hemoglobin 16.5 13.0 - 17.7  g/dL   Hematocrit 49.9 37.5 - 51.0 %   MCV 91 79 - 97 fL   MCH 30.1 26.6 - 33.0 pg   MCHC 33.1 31.5 - 35.7 g/dL   RDW 13.2 11.6 - 15.4 %   Platelets 246 150 - 450 x10E3/uL   Neutrophils 61 Not Estab. %   Lymphs 26 Not Estab. %   Monocytes 9 Not Estab. %   Eos 3 Not Estab. %   Basos 1 Not Estab. %   Neutrophils Absolute 4.1 1.4 - 7.0 x10E3/uL   Lymphocytes Absolute 1.8 0.7 - 3.1 x10E3/uL   Monocytes Absolute 0.6 0.1 - 0.9 x10E3/uL   EOS (ABSOLUTE) 0.2 0.0 - 0.4 x10E3/uL   Basophils Absolute 0.1 0.0 - 0.2 x10E3/uL   Immature Granulocytes 0 Not Estab. %   Immature Grans (Abs) 0.0 0.0 - 0.1 x10E3/uL  Comprehensive metabolic panel  Result Value Ref Range   Glucose 103 (H) 70 - 99 mg/dL   BUN 17 8 - 27 mg/dL   Creatinine, Ser 1.29 (H) 0.76 - 1.27 mg/dL   eGFR 63 >59 mL/min/1.73   BUN/Creatinine Ratio 13 10 - 24   Sodium 143 134 - 144 mmol/L   Potassium 4.0 3.5 - 5.2 mmol/L   Chloride 104 96 - 106 mmol/L   CO2 26 20 - 29 mmol/L   Calcium 9.3 8.6 - 10.2 mg/dL   Total Protein 7.7 6.0 - 8.5 g/dL   Albumin 4.5 3.9 - 4.9 g/dL   Globulin, Total 3.2 1.5 - 4.5 g/dL   Albumin/Globulin Ratio 1.4 1.2 - 2.2   Bilirubin Total 0.4 0.0 - 1.2 mg/dL   Alkaline Phosphatase 114 44 - 121 IU/L   AST 19 0 - 40 IU/L   ALT 34 0 - 44 IU/L  Lipid Panel w/o Chol/HDL Ratio  Result Value Ref Range   Cholesterol, Total 167 100 - 199 mg/dL   Triglycerides 144 0 - 149 mg/dL   HDL 52 >39 mg/dL   VLDL Cholesterol Cal 25 5 - 40 mg/dL   LDL Chol Calc (NIH) 90 0 - 99 mg/dL  TSH  Result Value Ref Range   TSH 0.915  0.450 - 4.500 uIU/mL  PSA  Result Value Ref Range   Prostate Specific Ag, Serum 1.1 0.0 - 4.0 ng/mL  HgB A1c  Result Value Ref Range   Hgb A1c MFr Bld 6.4 (H) 4.8 - 5.6 %   Est. average glucose Bld gHb Est-mCnc 137 mg/dL      Assessment & Plan:   Problem List Items Addressed This Visit       Cardiovascular and Mediastinum   Essential hypertension - Primary    Chronic, ongoing.  BP remaining above goal.  Will trial a change to Amlodipine-Valsartan-HCTZ combo pill 10-160-12.5 MG dosing + continue Metoprolol.  Stop Lisinopril.  Combo pill to minimize pill burden.  Educated patient on these changes and medications.  Recommend he monitor BP at least a few mornings a week at home and document.  DASH diet at home.  Labs today: none repeat next visit.  Return in 2 weeks.       Relevant Medications   amLODIPine-Valsartan-HCTZ 10-160-12.5 MG TABS     Respiratory   Sinusitis    Ongoing since URI -- present x 3 weeks.  Will stop Augmentin and start Doxycycline.  Trial Singulair for chronic allergies.  Recommend: - Increased rest - Increasing Fluids - Acetaminophen as needed for fever/pain.  - Salt water gargling, chloraseptic spray and throat  lozenges - Mucinex.  - Humidifying the air.  Return in 2 weeks, consider referral to ENT if ongoing issues.      Relevant Medications   doxycycline (VIBRA-TABS) 100 MG tablet     Follow up plan: Return in about 2 weeks (around 12/03/2022) for SINUS AND HTN.

## 2022-11-19 NOTE — Assessment & Plan Note (Addendum)
Ongoing since URI -- present x 3 weeks.  Will stop Augmentin and start Doxycycline.  Trial Singulair for chronic allergies.  Recommend: - Increased rest - Increasing Fluids - Acetaminophen as needed for fever/pain.  - Salt water gargling, chloraseptic spray and throat lozenges - Mucinex.  - Humidifying the air.  Return in 2 weeks, consider referral to ENT if ongoing issues.

## 2022-11-22 DIAGNOSIS — M5416 Radiculopathy, lumbar region: Secondary | ICD-10-CM | POA: Diagnosis not present

## 2022-11-22 DIAGNOSIS — M9903 Segmental and somatic dysfunction of lumbar region: Secondary | ICD-10-CM | POA: Diagnosis not present

## 2022-11-22 DIAGNOSIS — M9905 Segmental and somatic dysfunction of pelvic region: Secondary | ICD-10-CM | POA: Diagnosis not present

## 2022-11-22 DIAGNOSIS — M5431 Sciatica, right side: Secondary | ICD-10-CM | POA: Diagnosis not present

## 2022-11-28 NOTE — Patient Instructions (Signed)

## 2022-12-03 ENCOUNTER — Encounter: Payer: Self-pay | Admitting: Nurse Practitioner

## 2022-12-03 ENCOUNTER — Ambulatory Visit: Payer: BC Managed Care – PPO | Admitting: Nurse Practitioner

## 2022-12-03 VITALS — BP 128/72 | HR 79 | Temp 98.2°F | Ht 67.01 in | Wt 209.5 lb

## 2022-12-03 DIAGNOSIS — J0110 Acute frontal sinusitis, unspecified: Secondary | ICD-10-CM | POA: Diagnosis not present

## 2022-12-03 DIAGNOSIS — I10 Essential (primary) hypertension: Secondary | ICD-10-CM

## 2022-12-03 NOTE — Progress Notes (Signed)
BP 128/72 (BP Location: Left Arm, Patient Position: Sitting)   Pulse 79   Temp 98.2 F (36.8 C) (Oral)   Ht 5' 7.01" (1.702 m)   Wt 209 lb 8 oz (95 kg)   SpO2 96%   BMI 32.80 kg/m    Subjective:    Patient ID: Craig Ryan, male    DOB: Dec 27, 1960, 62 y.o.   MRN: [id number]  HPI: Craig Ryan is a 62 y.o. male  Chief Complaint  Patient presents with   Hypertension   sinus pressure    Here for follow up.    HYPERTENSION  Last visit we added on Valsartan -- we attempted to get combo pill (Amlodipine-Valsartan-HCTZ) but it was unavailable and continues Metoprolol.  Overall tolerating Valsartan. Hypertension status: stable  Satisfied with current treatment? yes Duration of hypertension: chronic BP monitoring frequency:  daily BP range: 130/70-80 BP medication side effects:  no Medication compliance: good compliance Aspirin: no Recurrent headaches: no Visual changes: no Palpitations: no Dyspnea: no Chest pain: no Lower extremity edema: no Dizzy/lightheaded: no  The 10-year ASCVD risk score (Arnett DK, et al., 2019) is: 9.1%   Values used to calculate the score:     Age: 22 years     Sex: Male     Is Non-Hispanic African American: No     Diabetic: No     Tobacco smoker: No     Systolic Blood Pressure: 867 mmHg     Is BP treated: Yes     HDL Cholesterol: 52 mg/dL     Total Cholesterol: 167 mg/dL\    UPPER RESPIRATORY TRACT INFECTION Having recurrent sinus issues recently, started on Singulair last visit which he is tolerating.  About one month ago was placed on Prednisone, Tessalon, Augmentin.  Prior to recent episodes had not been sick for some time -- since Covid 2021.  Currently is taking Zytec -- has been on for awhile.  Uses Flonase.  Reports ongoing sinus issues, feeling more to inner ear right side.  Is improving, but ongoing issue. Allergies used to be seasonal for him, but now they are all the time.  Has never been tested for allergies.  Saw Dr.  Tami Ribas years ago for similar issue. Fever: no  Cough: occasional Shortness of breath: no Wheezing: no Chest pain: no Chest tightness: no Chest congestion: no Nasal congestion: no Runny nose: yes Post nasal drip: yes Sneezing: yes Sore throat: no Swollen glands: no Sinus pressure: yes right side Headache: no Face pain: no Toothache: no Ear pain: no Ear pressure: yes "right Eyes red/itching:no Eye drainage/crusting: no  Vomiting: no Rash: no Fatigue: yes Sick contacts: yes Strep contacts: no  Context: fluctuating Recurrent sinusitis:  currently recurrence with no improvement Relief with OTC cold/cough medications: no  Treatments attempted: cold/sinus, mucinex, cough syrup, and antibiotics    Relevant past medical, surgical, family and social history reviewed and updated as indicated. Interim medical history since our last visit reviewed. Allergies and medications reviewed and updated.  Review of Systems  Constitutional:  Negative for activity change, appetite change, chills, diaphoresis, fatigue and fever.  HENT:  Positive for rhinorrhea and sinus pressure. Negative for congestion, ear discharge, ear pain, postnasal drip, sinus pain, sneezing, sore throat and voice change.   Respiratory:  Positive for cough. Negative for chest tightness, shortness of breath and wheezing.   Cardiovascular:  Negative for chest pain, palpitations and leg swelling.  Gastrointestinal: Negative.   Neurological:  Negative for headaches.  Psychiatric/Behavioral: Negative.  Per HPI unless specifically indicated above     Objective:    BP 128/72 (BP Location: Left Arm, Patient Position: Sitting)   Pulse 79   Temp 98.2 F (36.8 C) (Oral)   Ht 5' 7.01" (1.702 m)   Wt 209 lb 8 oz (95 kg)   SpO2 96%   BMI 32.80 kg/m   Wt Readings from Last 3 Encounters:  12/03/22 209 lb 8 oz (95 kg)  11/19/22 212 lb 9.6 oz (96.4 kg)  10/21/22 212 lb 11.2 oz (96.5 kg)    Physical Exam Vitals and  nursing note reviewed.  Constitutional:      General: He is awake. He is not in acute distress.    Appearance: He is well-developed and well-groomed. He is obese. He is not ill-appearing or toxic-appearing.  HENT:     Head: Normocephalic.     Right Ear: Hearing, ear canal and external ear normal. No tenderness. A middle ear effusion is present. Tympanic membrane is not injected or perforated.     Left Ear: Hearing, ear canal and external ear normal. No tenderness. A middle ear effusion is present. Tympanic membrane is not injected or perforated.     Nose: Rhinorrhea present. Rhinorrhea is clear.     Right Sinus: No maxillary sinus tenderness or frontal sinus tenderness.     Left Sinus: No maxillary sinus tenderness or frontal sinus tenderness.     Mouth/Throat:     Mouth: Mucous membranes are moist.     Pharynx: Posterior oropharyngeal erythema (mild cobblestone) present. No pharyngeal swelling or oropharyngeal exudate.  Eyes:     General: Lids are normal.     Extraocular Movements: Extraocular movements intact.     Conjunctiva/sclera: Conjunctivae normal.  Neck:     Thyroid: No thyromegaly.     Vascular: No carotid bruit.  Cardiovascular:     Rate and Rhythm: Normal rate and regular rhythm.     Heart sounds: Normal heart sounds.  Pulmonary:     Effort: No accessory muscle usage or respiratory distress.     Breath sounds: Normal breath sounds.  Abdominal:     General: Bowel sounds are normal. There is no distension.     Palpations: Abdomen is soft.     Tenderness: There is no abdominal tenderness.  Musculoskeletal:     Cervical back: Full passive range of motion without pain.     Right lower leg: No edema.     Left lower leg: No edema.  Lymphadenopathy:     Cervical: No cervical adenopathy.  Skin:    General: Skin is warm.     Capillary Refill: Capillary refill takes less than 2 seconds.  Neurological:     Mental Status: He is alert and oriented to person, place, and time.      Deep Tendon Reflexes: Reflexes are normal and symmetric.     Reflex Scores:      Brachioradialis reflexes are 2+ on the right side and 2+ on the left side.      Patellar reflexes are 2+ on the right side and 2+ on the left side. Psychiatric:        Attention and Perception: Attention normal.        Mood and Affect: Mood normal.        Speech: Speech normal.        Behavior: Behavior normal. Behavior is cooperative.        Thought Content: Thought content normal.     Results for orders  placed or performed in visit on 10/05/22  CBC with Differential/Platelet  Result Value Ref Range   WBC 6.7 3.4 - 10.8 x10E3/uL   RBC 5.49 4.14 - 5.80 x10E6/uL   Hemoglobin 16.5 13.0 - 17.7 g/dL   Hematocrit 49.9 37.5 - 51.0 %   MCV 91 79 - 97 fL   MCH 30.1 26.6 - 33.0 pg   MCHC 33.1 31.5 - 35.7 g/dL   RDW 13.2 11.6 - 15.4 %   Platelets 246 150 - 450 x10E3/uL   Neutrophils 61 Not Estab. %   Lymphs 26 Not Estab. %   Monocytes 9 Not Estab. %   Eos 3 Not Estab. %   Basos 1 Not Estab. %   Neutrophils Absolute 4.1 1.4 - 7.0 x10E3/uL   Lymphocytes Absolute 1.8 0.7 - 3.1 x10E3/uL   Monocytes Absolute 0.6 0.1 - 0.9 x10E3/uL   EOS (ABSOLUTE) 0.2 0.0 - 0.4 x10E3/uL   Basophils Absolute 0.1 0.0 - 0.2 x10E3/uL   Immature Granulocytes 0 Not Estab. %   Immature Grans (Abs) 0.0 0.0 - 0.1 x10E3/uL  Comprehensive metabolic panel  Result Value Ref Range   Glucose 103 (H) 70 - 99 mg/dL   BUN 17 8 - 27 mg/dL   Creatinine, Ser 1.29 (H) 0.76 - 1.27 mg/dL   eGFR 63 >59 mL/min/1.73   BUN/Creatinine Ratio 13 10 - 24   Sodium 143 134 - 144 mmol/L   Potassium 4.0 3.5 - 5.2 mmol/L   Chloride 104 96 - 106 mmol/L   CO2 26 20 - 29 mmol/L   Calcium 9.3 8.6 - 10.2 mg/dL   Total Protein 7.7 6.0 - 8.5 g/dL   Albumin 4.5 3.9 - 4.9 g/dL   Globulin, Total 3.2 1.5 - 4.5 g/dL   Albumin/Globulin Ratio 1.4 1.2 - 2.2   Bilirubin Total 0.4 0.0 - 1.2 mg/dL   Alkaline Phosphatase 114 44 - 121 IU/L   AST 19 0 - 40 IU/L   ALT  34 0 - 44 IU/L  Lipid Panel w/o Chol/HDL Ratio  Result Value Ref Range   Cholesterol, Total 167 100 - 199 mg/dL   Triglycerides 144 0 - 149 mg/dL   HDL 52 >39 mg/dL   VLDL Cholesterol Cal 25 5 - 40 mg/dL   LDL Chol Calc (NIH) 90 0 - 99 mg/dL  TSH  Result Value Ref Range   TSH 0.915 0.450 - 4.500 uIU/mL  PSA  Result Value Ref Range   Prostate Specific Ag, Serum 1.1 0.0 - 4.0 ng/mL  HgB A1c  Result Value Ref Range   Hgb A1c MFr Bld 6.4 (H) 4.8 - 5.6 %   Est. average glucose Bld gHb Est-mCnc 137 mg/dL      Assessment & Plan:   Problem List Items Addressed This Visit       Cardiovascular and Mediastinum   Essential hypertension    Chronic, improving with addition of Valsartan and stopping Lisinopril. Continue Amlodipine, Valsartan, HCTZ, and Metoprolol.  Attempt to get combo pill in future to minimize pill burden, not available at this time.  Educated patient on these changes and medications.  Recommend he monitor BP at least a few mornings a week at home and document.  DASH diet at home.  Labs today: none repeat next visit.  Return in May for visit.       Relevant Medications   amLODipine (NORVASC) 10 MG tablet     Respiratory   Sinusitis - Primary    Ongoing issue, mainly  to right side.  Tolerating Singulair.  Referral to ENT for further evaluation.      Relevant Orders   Ambulatory referral to ENT     Follow up plan: Return in about 4 months (around 04/05/2023) for HTN/HLD, PREDIABETES, BPH.

## 2022-12-03 NOTE — Assessment & Plan Note (Signed)
Ongoing issue, mainly to right side.  Tolerating Singulair.  Referral to ENT for further evaluation.

## 2022-12-03 NOTE — Assessment & Plan Note (Signed)
Chronic, improving with addition of Valsartan and stopping Lisinopril. Continue Amlodipine, Valsartan, HCTZ, and Metoprolol.  Attempt to get combo pill in future to minimize pill burden, not available at this time.  Educated patient on these changes and medications.  Recommend he monitor BP at least a few mornings a week at home and document.  DASH diet at home.  Labs today: none repeat next visit.  Return in May for visit.

## 2022-12-17 ENCOUNTER — Encounter: Payer: Self-pay | Admitting: Nurse Practitioner

## 2022-12-20 DIAGNOSIS — M5431 Sciatica, right side: Secondary | ICD-10-CM | POA: Diagnosis not present

## 2022-12-20 DIAGNOSIS — M5416 Radiculopathy, lumbar region: Secondary | ICD-10-CM | POA: Diagnosis not present

## 2022-12-20 DIAGNOSIS — M9905 Segmental and somatic dysfunction of pelvic region: Secondary | ICD-10-CM | POA: Diagnosis not present

## 2022-12-20 DIAGNOSIS — M9903 Segmental and somatic dysfunction of lumbar region: Secondary | ICD-10-CM | POA: Diagnosis not present

## 2023-01-10 DIAGNOSIS — J301 Allergic rhinitis due to pollen: Secondary | ICD-10-CM | POA: Diagnosis not present

## 2023-01-10 DIAGNOSIS — H6981 Other specified disorders of Eustachian tube, right ear: Secondary | ICD-10-CM | POA: Diagnosis not present

## 2023-02-01 DIAGNOSIS — M9905 Segmental and somatic dysfunction of pelvic region: Secondary | ICD-10-CM | POA: Diagnosis not present

## 2023-02-01 DIAGNOSIS — M5416 Radiculopathy, lumbar region: Secondary | ICD-10-CM | POA: Diagnosis not present

## 2023-02-01 DIAGNOSIS — M9903 Segmental and somatic dysfunction of lumbar region: Secondary | ICD-10-CM | POA: Diagnosis not present

## 2023-02-01 DIAGNOSIS — M5431 Sciatica, right side: Secondary | ICD-10-CM | POA: Diagnosis not present

## 2023-03-09 DIAGNOSIS — M9903 Segmental and somatic dysfunction of lumbar region: Secondary | ICD-10-CM | POA: Diagnosis not present

## 2023-03-09 DIAGNOSIS — M9905 Segmental and somatic dysfunction of pelvic region: Secondary | ICD-10-CM | POA: Diagnosis not present

## 2023-03-09 DIAGNOSIS — M5416 Radiculopathy, lumbar region: Secondary | ICD-10-CM | POA: Diagnosis not present

## 2023-03-09 DIAGNOSIS — M5431 Sciatica, right side: Secondary | ICD-10-CM | POA: Diagnosis not present

## 2023-03-17 DIAGNOSIS — H2513 Age-related nuclear cataract, bilateral: Secondary | ICD-10-CM | POA: Diagnosis not present

## 2023-03-17 DIAGNOSIS — Z83518 Family history of other specified eye disorder: Secondary | ICD-10-CM | POA: Diagnosis not present

## 2023-03-17 DIAGNOSIS — H35073 Retinal telangiectasis, bilateral: Secondary | ICD-10-CM | POA: Diagnosis not present

## 2023-03-17 DIAGNOSIS — H33321 Round hole, right eye: Secondary | ICD-10-CM | POA: Diagnosis not present

## 2023-03-20 ENCOUNTER — Other Ambulatory Visit: Payer: Self-pay | Admitting: Nurse Practitioner

## 2023-03-21 ENCOUNTER — Other Ambulatory Visit: Payer: Self-pay | Admitting: Physician Assistant

## 2023-03-21 NOTE — Telephone Encounter (Signed)
Requested Prescriptions  Pending Prescriptions Disp Refills   montelukast (SINGULAIR) 10 MG tablet [Pharmacy Med Name: MONTELUKAST 10MG  TABLETS] 90 tablet 1    Sig: TAKE 1 TABLET(10 MG) BY MOUTH AT BEDTIME     Pulmonology:  Leukotriene Inhibitors Passed - 03/20/2023  9:40 PM      Passed - Valid encounter within last 12 months    Recent Outpatient Visits           3 months ago Acute non-recurrent frontal sinusitis   [place] [facility] [place], Corrie Dandy T, NP   4 months ago Essential hypertension   [place] [facility] [place], Bud T, NP   5 months ago Essential hypertension   [place] [facility] [place], Oswaldo Conroy, PA-C   5 months ago Need for influenza vaccination   [place] [facility] Gabriel Cirri, NP   11 months ago Hyperlipidemia, unspecified hyperlipidemia type   [place] [facility] V[place], Avanti, MD       Future Appointments             In 2 weeks Cannady, Dorie Rank, NP [place] [facility]

## 2023-03-21 NOTE — Telephone Encounter (Signed)
Requested medication (s) are due for refill today: yes  Requested medication (s) are on the active medication list: yes  Last refill:  11/22/22  Future visit scheduled: yes  Notes to clinic:  Unable to refill per protocol, last refill by another provider. Historical medication, routing for review.     Requested Prescriptions  Pending Prescriptions Disp Refills   amLODipine (NORVASC) 10 MG tablet [Pharmacy Med Name: AMLODIPINE BESYLATE 10MG  TABLETS] 30 tablet     Sig: TAKE 1 TABLET(10 MG) BY MOUTH DAILY     Cardiovascular: Calcium Channel Blockers 2 Passed - 03/21/2023  7:48 AM      Passed - Last BP in normal range    BP Readings from Last 1 Encounters:  12/03/22 128/72         Passed - Last Heart Rate in normal range    Pulse Readings from Last 1 Encounters:  12/03/22 79         Passed - Valid encounter within last 6 months    Recent Outpatient Visits           3 months ago Acute non-recurrent frontal sinusitis   [place] [facility] [place], Corrie Dandy T, NP   4 months ago Essential hypertension   [place] [facility] [place], Douglas T, NP   5 months ago Essential hypertension   [place] [facility] Mecum, Oswaldo Conroy, PA-C   5 months ago Need for influenza vaccination   [place] [facility] Gabriel Cirri, NP   11 months ago Hyperlipidemia, unspecified hyperlipidemia type   [place] [facility] Vigg, Avanti, MD       Future Appointments             In 2 weeks Cannady, Dorie Rank, NP [place] [facility]

## 2023-04-04 NOTE — Patient Instructions (Signed)
Prediabetes Eating Plan Prediabetes is a condition that causes blood sugar (glucose) levels to be higher than normal. This increases the risk for developing type 2 diabetes (type 2 diabetes mellitus). Working with a health care provider or nutrition specialist (dietitian) to make diet and lifestyle changes can help prevent the onset of diabetes. These changes may help you: Control your blood glucose levels. Improve your cholesterol levels. Manage your blood pressure. What are tips for following this plan? Reading food labels Read food labels to check the amount of fat, salt (sodium), and sugar in prepackaged foods. Avoid foods that have: Saturated fats. Trans fats. Added sugars. Avoid foods that have more than 300 milligrams (mg) of sodium per serving. Limit your sodium intake to less than 2,300 mg each day. Shopping Avoid buying pre-made and processed foods. Avoid buying drinks with added sugar. Cooking Cook with olive oil. Do not use butter, lard, or ghee. Bake, broil, grill, steam, or boil foods. Avoid frying. Meal planning  Work with your dietitian to create an eating plan that is right for you. This may include tracking how many calories you take in each day. Use a food diary, notebook, or mobile application to track what you eat at each meal. Consider following a Mediterranean diet. This includes: Eating several servings of fresh fruits and vegetables each day. Eating fish at least twice a week. Eating one serving each day of whole grains, beans, nuts, and seeds. Using olive oil instead of other fats. Limiting alcohol. Limiting red meat. Using nonfat or low-fat dairy products. Consider following a plant-based diet. This includes dietary choices that focus on eating mostly vegetables and fruit, grains, beans, nuts, and seeds. If you have high blood pressure, you may need to limit your sodium intake or follow a diet such as the DASH (Dietary Approaches to Stop Hypertension) eating  plan. The DASH diet aims to lower high blood pressure. Lifestyle Set weight loss goals with help from your health care team. It is recommended that most people with prediabetes lose 7% of their body weight. Exercise for at least 30 minutes 5 or more days a week. Attend a support group or seek support from a mental health counselor. Take over-the-counter and prescription medicines only as told by your health care provider. What foods are recommended? Fruits Berries. Bananas. Apples. Oranges. Grapes. Papaya. Mango. Pomegranate. Kiwi. Grapefruit. Cherries. Vegetables Lettuce. Spinach. Peas. Beets. Cauliflower. Cabbage. Broccoli. Carrots. Tomatoes. Squash. Eggplant. Herbs. Peppers. Onions. Cucumbers. Brussels sprouts. Grains Whole grains, such as whole-wheat or whole-grain breads, crackers, cereals, and pasta. Unsweetened oatmeal. Bulgur. Barley. Quinoa. Brown rice. Corn or whole-wheat flour tortillas or taco shells. Meats and other proteins Seafood. Poultry without skin. Lean cuts of pork and beef. Tofu. Eggs. Nuts. Beans. Dairy Low-fat or fat-free dairy products, such as yogurt, cottage cheese, and cheese. Beverages Water. Tea. Coffee. Sugar-free or diet soda. Seltzer water. Low-fat or nonfat milk. Milk alternatives, such as soy or almond milk. Fats and oils Olive oil. Canola oil. Sunflower oil. Grapeseed oil. Avocado. Walnuts. Sweets and desserts Sugar-free or low-fat pudding. Sugar-free or low-fat ice cream and other frozen treats. Seasonings and condiments Herbs. Sodium-free spices. Mustard. Relish. Low-salt, low-sugar ketchup. Low-salt, low-sugar barbecue sauce. Low-fat or fat-free mayonnaise. The items listed above may not be a complete list of recommended foods and beverages. Contact a dietitian for more information. What foods are not recommended? Fruits Fruits canned with syrup. Vegetables Canned vegetables. Frozen vegetables with butter or cream sauce. Grains Refined white  flour and flour   products, such as bread, pasta, snack foods, and cereals. Meats and other proteins Fatty cuts of meat. Poultry with skin. Breaded or fried meat. Processed meats. Dairy Full-fat yogurt, cheese, or milk. Beverages Sweetened drinks, such as iced tea and soda. Fats and oils Butter. Lard. Ghee. Sweets and desserts Baked goods, such as cake, cupcakes, pastries, cookies, and cheesecake. Seasonings and condiments Spice mixes with added salt. Ketchup. Barbecue sauce. Mayonnaise. The items listed above may not be a complete list of foods and beverages that are not recommended. Contact a dietitian for more information. Where to find more information American Diabetes Association: www.diabetes.org Summary You may need to make diet and lifestyle changes to help prevent the onset of diabetes. These changes can help you control blood sugar, improve cholesterol levels, and manage blood pressure. Set weight loss goals with help from your health care team. It is recommended that most people with prediabetes lose 7% of their body weight. Consider following a Mediterranean diet. This includes eating plenty of fresh fruits and vegetables, whole grains, beans, nuts, seeds, fish, and low-fat dairy, and using olive oil instead of other fats. This information is not intended to replace advice given to you by your health care provider. Make sure you discuss any questions you have with your health care provider. Document Revised: 01/24/2020 Document Reviewed: 01/24/2020 Elsevier Patient Education  2024 Elsevier Inc.  

## 2023-04-07 ENCOUNTER — Ambulatory Visit: Payer: BC Managed Care – PPO | Admitting: Nurse Practitioner

## 2023-04-07 ENCOUNTER — Encounter: Payer: Self-pay | Admitting: Nurse Practitioner

## 2023-04-07 VITALS — BP 110/75 | HR 58 | Temp 98.0°F | Ht 67.01 in | Wt 208.8 lb

## 2023-04-07 DIAGNOSIS — I10 Essential (primary) hypertension: Secondary | ICD-10-CM | POA: Diagnosis not present

## 2023-04-07 DIAGNOSIS — R3912 Poor urinary stream: Secondary | ICD-10-CM

## 2023-04-07 DIAGNOSIS — R7303 Prediabetes: Secondary | ICD-10-CM | POA: Diagnosis not present

## 2023-04-07 DIAGNOSIS — E782 Mixed hyperlipidemia: Secondary | ICD-10-CM

## 2023-04-07 DIAGNOSIS — E669 Obesity, unspecified: Secondary | ICD-10-CM

## 2023-04-07 DIAGNOSIS — G4733 Obstructive sleep apnea (adult) (pediatric): Secondary | ICD-10-CM

## 2023-04-07 DIAGNOSIS — N401 Enlarged prostate with lower urinary tract symptoms: Secondary | ICD-10-CM

## 2023-04-07 LAB — MICROALBUMIN, URINE WAIVED
Creatinine, Urine Waived: 100 mg/dL (ref 10–300)
Microalb, Ur Waived: 30 mg/L — ABNORMAL HIGH (ref 0–19)

## 2023-04-07 LAB — BAYER DCA HB A1C WAIVED: HB A1C (BAYER DCA - WAIVED): 6.5 % — ABNORMAL HIGH (ref 4.8–5.6)

## 2023-04-07 MED ORDER — TAMSULOSIN HCL 0.4 MG PO CAPS
0.4000 mg | ORAL_CAPSULE | Freq: Every day | ORAL | 4 refills | Status: DC
Start: 2023-04-07 — End: 2024-05-09

## 2023-04-07 MED ORDER — AMLODIPINE-VALSARTAN-HCTZ 10-160-12.5 MG PO TABS
1.0000 | ORAL_TABLET | Freq: Every day | ORAL | 4 refills | Status: DC
Start: 2023-04-07 — End: 2023-06-21

## 2023-04-07 MED ORDER — METOPROLOL SUCCINATE ER 50 MG PO TB24: 50.0000 mg | ORAL_TABLET | Freq: Every day | ORAL | 4 refills | Status: AC

## 2023-04-07 MED ORDER — AMLODIPINE BESYLATE 10 MG PO TABS: ORAL_TABLET | ORAL | 4 refills | Status: DC

## 2023-04-07 MED ORDER — ATORVASTATIN CALCIUM 40 MG PO TABS: ORAL_TABLET | ORAL | 4 refills | Status: DC

## 2023-04-07 MED ORDER — VALSARTAN 160 MG PO TABS: 160.0000 mg | ORAL_TABLET | Freq: Every day | ORAL | 4 refills | Status: DC

## 2023-04-07 MED ORDER — HYDROCHLOROTHIAZIDE 12.5 MG PO TABS: 12.5000 mg | ORAL_TABLET | Freq: Every day | ORAL | 4 refills | Status: DC

## 2023-04-07 MED ORDER — MONTELUKAST SODIUM 10 MG PO TABS: ORAL_TABLET | ORAL | 4 refills | Status: DC

## 2023-04-07 NOTE — Assessment & Plan Note (Signed)
Chronic, stable.  Continue current medication regimen and adjust as needed.  Lipid panel today. 

## 2023-04-07 NOTE — Assessment & Plan Note (Signed)
A1c 6.5% today, trend up from previous 6.4%.  We discussed at length that 6.5% or greater is considered diabetes level, however at this time will focus heavily on diet changes and daily walks (he is walking one mile a day) + working with wife on meal planning.  Our plan is to recheck A1c in 3 months, if this remains 6.5% or greater then we will change to diabetes and start medication.  Educated him on options such as Metformin, GLP1, and SGLT2.

## 2023-04-07 NOTE — Assessment & Plan Note (Addendum)
Chronic,stable with BP at goal in office and on home readings.  Attempt to get combo pill to minimize pill burden (HCTZ/Amlodipine/Valsartan), have ordered today -- however wrote on script if unable to obtain then can fill individual HCTZ, Amlodipine, Valsartan.  Continue all current medications and adjust as needed.  Educated patient on medications.  Recommend he monitor BP at least a few mornings a week at home and document.  DASH diet at home.  Labs today: CMP and urine ALB.  Urine ALB 07 Apr 2023, maintain ARB for kidney protection.

## 2023-04-07 NOTE — Assessment & Plan Note (Signed)
Chronic, ongoing with mild symptoms.  AUA = 4.  Recommend continue Flomax as ordered. PSA annually.

## 2023-04-07 NOTE — Assessment & Plan Note (Signed)
BMI 32.69.  Recommended eating smaller high protein, low fat meals more frequently and exercising 30 mins a day 5 times a week with a goal of 10-15lb weight loss in the next 3 months. Patient voiced their understanding and motivation to adhere to these recommendations.

## 2023-04-07 NOTE — Progress Notes (Signed)
BP 110/75   Pulse (!) 58   Temp 98 F (36.7 C) (Oral)   Ht 5' 7.01" (1.702 m)   Wt 208 lb 12.8 oz (94.7 kg)   SpO2 97%   BMI 32.69 kg/m    Subjective:    Patient ID: Craig Ryan, male    DOB: 01-10-1961, 62 y.o.   MRN: [id number]  HPI: Craig Ryan is a 62 y.o. male  Chief Complaint  Patient presents with   Hypertension   Prediabetes   Benign Prostatic Hypertrophy   HYPERTENSION / HYPERLIPIDEMIA Continues on Amlodipine, Valsartan, Metoprolol, HCTZ, Atorvastatin, + ASA.  Uses CPAP 100% of the time. Satisfied with current treatment? yes Duration of hypertension: chronic BP monitoring frequency: daily BP range: average on app 127/81 BP medication side effects: no Duration of hyperlipidemia: chronic Cholesterol medication side effects: no Cholesterol supplements: none Medication compliance: good compliance Aspirin: yes on occasion Recent stressors: no Recurrent headaches: no Visual changes: no Palpitations: no Dyspnea: no Chest pain: no Lower extremity edema: no Dizzy/lightheaded: no   PREDIABETES Last A1c was 6.4% in November 2023. Has been working on diet changes, less carbs and sugar. Polydipsia/polyuria: no Visual disturbance: no Chest pain: no Paresthesias: occasional to both legs  BPH Taking Flomax. BPH status: stable Satisfied with current treatment?: yes Medication side effects: no Medication compliance: good compliance Duration: chronic Nocturia:  occasional Urinary frequency:no Incomplete voiding: occasional Urgency: no Weak urinary stream:  occasional Straining to start stream: no Dysuria: no Onset: gradual Severity: mild Alleviating factors: medication. Aggravating factors: unknown Treatments attempted: Flomax IPSS Questionnaire (AUA-7): 4 mild Over the past month.   1)  How often have you had a sensation of not emptying your bladder completely after you finish urinating?  1 - Less than 1 time in 5  2)  How often have you had  to urinate again less than two hours after you finished urinating? 1 - Less than 1 time in 5  3)  How often have you found you stopped and started again several times when you urinated?  0 - Not at all  4) How difficult have you found it to postpone urination?  0 - Not at all  5) How often have you had a weak urinary stream?  1 - Less than 1 time in 5  6) How often have you had to push or strain to begin urination?  0 - Not at all  7) How many times did you most typically get up to urinate from the time you went to bed until the time you got up in the morning?  1 - 1 time  Total score:  0-7 mildly symptomatic   8-19 moderately symptomatic   20-35 severely symptomatic     Relevant past medical, surgical, family and social history reviewed and updated as indicated. Interim medical history since our last visit reviewed. Allergies and medications reviewed and updated.  Review of Systems  Constitutional:  Negative for activity change, diaphoresis, fatigue and fever.  Respiratory:  Negative for cough, chest tightness, shortness of breath and wheezing.   Cardiovascular:  Negative for chest pain, palpitations and leg swelling.  Gastrointestinal: Negative.   Endocrine: Negative for cold intolerance, heat intolerance, polydipsia, polyphagia and polyuria.  Neurological: Negative.   Psychiatric/Behavioral: Negative.      Per HPI unless specifically indicated above     Objective:    BP 110/75   Pulse (!) 58   Temp 98 F (36.7 C) (Oral)   Ht  5' 7.01" (1.702 m)   Wt 208 lb 12.8 oz (94.7 kg)   SpO2 97%   BMI 32.69 kg/m   Wt Readings from Last 3 Encounters:  04/07/23 208 lb 12.8 oz (94.7 kg)  12/03/22 209 lb 8 oz (95 kg)  11/19/22 212 lb 9.6 oz (96.4 kg)    Physical Exam Vitals and nursing note reviewed.  Constitutional:      General: He is awake. He is not in acute distress.    Appearance: He is well-developed and well-groomed. He is obese. He is not ill-appearing or toxic-appearing.   HENT:     Head: Normocephalic.     Right Ear: Hearing and external ear normal.     Left Ear: Hearing and external ear normal.  Eyes:     General: Lids are normal.     Extraocular Movements: Extraocular movements intact.     Conjunctiva/sclera: Conjunctivae normal.  Neck:     Thyroid: No thyromegaly.     Vascular: No carotid bruit.  Cardiovascular:     Rate and Rhythm: Normal rate and regular rhythm.     Heart sounds: Normal heart sounds. No murmur heard.    No gallop.  Pulmonary:     Effort: Pulmonary effort is normal. No accessory muscle usage or respiratory distress.     Breath sounds: Normal breath sounds. No decreased breath sounds, wheezing or rhonchi.  Abdominal:     General: Bowel sounds are normal. There is no distension.     Palpations: Abdomen is soft.     Tenderness: There is no abdominal tenderness.  Musculoskeletal:     Cervical back: Full passive range of motion without pain.     Right lower leg: No edema.     Left lower leg: No edema.  Lymphadenopathy:     Cervical: No cervical adenopathy.  Skin:    General: Skin is warm.     Capillary Refill: Capillary refill takes less than 2 seconds.  Neurological:     Mental Status: He is alert and oriented to person, place, and time.     Deep Tendon Reflexes: Reflexes are normal and symmetric.     Reflex Scores:      Brachioradialis reflexes are 2+ on the right side and 2+ on the left side.      Patellar reflexes are 2+ on the right side and 2+ on the left side. Psychiatric:        Attention and Perception: Attention normal.        Mood and Affect: Mood normal.        Speech: Speech normal.        Behavior: Behavior normal. Behavior is cooperative.        Thought Content: Thought content normal.    Results for orders placed or performed in visit on 10/05/22  CBC with Differential/Platelet  Result Value Ref Range   WBC 6.7 3.4 - 10.8 x10E3/uL   RBC 5.49 4.14 - 5.80 x10E6/uL   Hemoglobin 16.5 13.0 - 17.7 g/dL    Hematocrit 96.0 45.4 - 51.0 %   MCV 91 79 - 97 fL   MCH 30.1 26.6 - 33.0 pg   MCHC 33.1 31.5 - 35.7 g/dL   RDW 09.8 11.9 - 14.7 %   Platelets 246 150 - 450 x10E3/uL   Neutrophils 61 Not Estab. %   Lymphs 26 Not Estab. %   Monocytes 9 Not Estab. %   Eos 3 Not Estab. %   Basos 1 Not Estab. %  Neutrophils Absolute 4.1 1.4 - 7.0 x10E3/uL   Lymphocytes Absolute 1.8 0.7 - 3.1 x10E3/uL   Monocytes Absolute 0.6 0.1 - 0.9 x10E3/uL   EOS (ABSOLUTE) 0.2 0.0 - 0.4 x10E3/uL   Basophils Absolute 0.1 0.0 - 0.2 x10E3/uL   Immature Granulocytes 0 Not Estab. %   Immature Grans (Abs) 0.0 0.0 - 0.1 x10E3/uL  Comprehensive metabolic panel  Result Value Ref Range   Glucose 103 (H) 70 - 99 mg/dL   BUN 17 8 - 27 mg/dL   Creatinine, Ser 8.11 (H) 0.76 - 1.27 mg/dL   eGFR 63 >91 YN/WGN/5.62   BUN/Creatinine Ratio 13 10 - 24   Sodium 143 134 - 144 mmol/L   Potassium 4.0 3.5 - 5.2 mmol/L   Chloride 104 96 - 106 mmol/L   CO2 26 20 - 29 mmol/L   Calcium 9.3 8.6 - 10.2 mg/dL   Total Protein 7.7 6.0 - 8.5 g/dL   Albumin 4.5 3.9 - 4.9 g/dL   Globulin, Total 3.2 1.5 - 4.5 g/dL   Albumin/Globulin Ratio 1.4 1.2 - 2.2   Bilirubin Total 0.4 0.0 - 1.2 mg/dL   Alkaline Phosphatase 114 44 - 121 IU/L   AST 19 0 - 40 IU/L   ALT 34 0 - 44 IU/L  Lipid Panel w/o Chol/HDL Ratio  Result Value Ref Range   Cholesterol, Total 167 100 - 199 mg/dL   Triglycerides 130 0 - 149 mg/dL   HDL 52 >86 mg/dL   VLDL Cholesterol Cal 25 5 - 40 mg/dL   LDL Chol Calc (NIH) 90 0 - 99 mg/dL  TSH  Result Value Ref Range   TSH 0.915 0.450 - 4.500 uIU/mL  PSA  Result Value Ref Range   Prostate Specific Ag, Serum 1.1 0.0 - 4.0 ng/mL  HgB A1c  Result Value Ref Range   Hgb A1c MFr Bld 6.4 (H) 4.8 - 5.6 %   Est. average glucose Bld gHb Est-mCnc 137 mg/dL      Assessment & Plan:   Problem List Items Addressed This Visit       Cardiovascular and Mediastinum   Essential hypertension - Primary    Chronic,stable with BP at goal in  office and on home readings.  Attempt to get combo pill to minimize pill burden (HCTZ/Amlodipine/Valsartan), have ordered today -- however wrote on script if unable to obtain then can fill individual HCTZ, Amlodipine, Valsartan.  Continue all current medications and adjust as needed.  Educated patient on medications.  Recommend he monitor BP at least a few mornings a week at home and document.  DASH diet at home.  Labs today: CMP and urine ALB.  Urine ALB 07 Apr 2023, maintain ARB for kidney protection.         Relevant Medications   atorvastatin (LIPITOR) 40 MG tablet   amLODipine (NORVASC) 10 MG tablet   hydrochlorothiazide (HYDRODIURIL) 12.5 MG tablet   metoprolol succinate (TOPROL-XL) 50 MG 24 hr tablet   valsartan (DIOVAN) 160 MG tablet   amLODIPine-Valsartan-HCTZ 10-160-12.5 MG TABS   Other Relevant Orders   Microalbumin, Urine Waived   Comprehensive metabolic panel     Respiratory   OSA on CPAP    Chronic, compliant with CPAP regimen.  Praised for 100% use.        Genitourinary   Benign prostatic hyperplasia with weak urinary stream    Chronic, ongoing with mild symptoms.  AUA = 4.  Recommend continue Flomax as ordered. PSA annually.  Relevant Medications   tamsulosin (FLOMAX) 0.4 MG CAPS capsule     Other   Hyperlipidemia    Chronic, stable.  Continue current medication regimen and adjust as needed.  Lipid panel today.      Relevant Medications   atorvastatin (LIPITOR) 40 MG tablet   amLODipine (NORVASC) 10 MG tablet   hydrochlorothiazide (HYDRODIURIL) 12.5 MG tablet   metoprolol succinate (TOPROL-XL) 50 MG 24 hr tablet   valsartan (DIOVAN) 160 MG tablet   amLODIPine-Valsartan-HCTZ 10-160-12.5 MG TABS   Other Relevant Orders   Comprehensive metabolic panel   Lipid Panel w/o Chol/HDL Ratio   Obesity (BMI 30-39.9)    BMI 32.69.  Recommended eating smaller high protein, low fat meals more frequently and exercising 30 mins a day 5 times a week with a goal of  10-15lb weight loss in the next 3 months. Patient voiced their understanding and motivation to adhere to these recommendations.       Prediabetes    A1c 6.5% today, trend up from previous 6.4%.  We discussed at length that 6.5% or greater is considered diabetes level, however at this time will focus heavily on diet changes and daily walks (he is walking one mile a day) + working with wife on meal planning.  Our plan is to recheck A1c in 3 months, if this remains 6.5% or greater then we will change to diabetes and start medication.  Educated him on options such as Metformin, GLP1, and SGLT2.      Relevant Orders   Bayer DCA Hb A1c Waived   Microalbumin, Urine Waived     Follow up plan: Return in about 3 months (around 07/08/2023) for PREDIABETES (A1C) check.

## 2023-04-07 NOTE — Assessment & Plan Note (Signed)
Chronic, compliant with CPAP regimen.  Praised for 100% use. 

## 2023-04-08 ENCOUNTER — Ambulatory Visit: Payer: BC Managed Care – PPO | Admitting: Nurse Practitioner

## 2023-04-08 LAB — COMPREHENSIVE METABOLIC PANEL
ALT: 34 IU/L (ref 0–44)
AST: 19 IU/L (ref 0–40)
Albumin/Globulin Ratio: 1.4 (ref 1.2–2.2)
Albumin: 4.2 g/dL (ref 3.9–4.9)
Alkaline Phosphatase: 87 IU/L (ref 44–121)
BUN/Creatinine Ratio: 16 (ref 10–24)
BUN: 18 mg/dL (ref 8–27)
Bilirubin Total: 0.3 mg/dL (ref 0.0–1.2)
CO2: 23 mmol/L (ref 20–29)
Calcium: 9.2 mg/dL (ref 8.6–10.2)
Chloride: 104 mmol/L (ref 96–106)
Creatinine, Ser: 1.15 mg/dL (ref 0.76–1.27)
Globulin, Total: 3.1 g/dL (ref 1.5–4.5)
Glucose: 104 mg/dL — ABNORMAL HIGH (ref 70–99)
Potassium: 3.9 mmol/L (ref 3.5–5.2)
Sodium: 142 mmol/L (ref 134–144)
Total Protein: 7.3 g/dL (ref 6.0–8.5)
eGFR: 72 mL/min/{1.73_m2} (ref >59)

## 2023-04-08 LAB — LIPID PANEL W/O CHOL/HDL RATIO
Cholesterol, Total: 131 mg/dL (ref 100–199)
HDL: 43 mg/dL (ref >39)
LDL Chol Calc (NIH): 72 mg/dL (ref 0–99)
Triglycerides: 79 mg/dL (ref 0–149)
VLDL Cholesterol Cal: 16 mg/dL (ref 5–40)

## 2023-04-08 NOTE — Progress Notes (Signed)
Contacted via MyChart   Good morning Craig Ryan, your labs have returned and overall these are stable.  Kidney function, creatinine and eGFR, remains normal, as is liver function, AST and ALT.  Cholesterol levels stable.  Continue current medications.  Any questions? Keep being amazing!!  Thank you for allowing me to participate in your care.  I appreciate you. Kindest regards, Kiannah Grunow

## 2023-04-11 DIAGNOSIS — M9905 Segmental and somatic dysfunction of pelvic region: Secondary | ICD-10-CM | POA: Diagnosis not present

## 2023-04-11 DIAGNOSIS — M9903 Segmental and somatic dysfunction of lumbar region: Secondary | ICD-10-CM | POA: Diagnosis not present

## 2023-04-11 DIAGNOSIS — M5416 Radiculopathy, lumbar region: Secondary | ICD-10-CM | POA: Diagnosis not present

## 2023-04-11 DIAGNOSIS — M5431 Sciatica, right side: Secondary | ICD-10-CM | POA: Diagnosis not present

## 2023-05-03 DIAGNOSIS — D2261 Melanocytic nevi of right upper limb, including shoulder: Secondary | ICD-10-CM | POA: Diagnosis not present

## 2023-05-03 DIAGNOSIS — D225 Melanocytic nevi of trunk: Secondary | ICD-10-CM | POA: Diagnosis not present

## 2023-05-03 DIAGNOSIS — Z85828 Personal history of other malignant neoplasm of skin: Secondary | ICD-10-CM | POA: Diagnosis not present

## 2023-05-03 DIAGNOSIS — D2262 Melanocytic nevi of left upper limb, including shoulder: Secondary | ICD-10-CM | POA: Diagnosis not present

## 2023-05-09 DIAGNOSIS — M9903 Segmental and somatic dysfunction of lumbar region: Secondary | ICD-10-CM | POA: Diagnosis not present

## 2023-05-09 DIAGNOSIS — M5416 Radiculopathy, lumbar region: Secondary | ICD-10-CM | POA: Diagnosis not present

## 2023-05-09 DIAGNOSIS — M5431 Sciatica, right side: Secondary | ICD-10-CM | POA: Diagnosis not present

## 2023-05-09 DIAGNOSIS — M9905 Segmental and somatic dysfunction of pelvic region: Secondary | ICD-10-CM | POA: Diagnosis not present

## 2023-05-16 LAB — HM DIABETES EYE EXAM

## 2023-06-01 ENCOUNTER — Encounter: Payer: Self-pay | Admitting: Nurse Practitioner

## 2023-06-05 ENCOUNTER — Other Ambulatory Visit: Payer: Self-pay | Admitting: Nurse Practitioner

## 2023-06-05 DIAGNOSIS — I10 Essential (primary) hypertension: Secondary | ICD-10-CM

## 2023-06-06 DIAGNOSIS — M5431 Sciatica, right side: Secondary | ICD-10-CM | POA: Diagnosis not present

## 2023-06-06 DIAGNOSIS — M5416 Radiculopathy, lumbar region: Secondary | ICD-10-CM | POA: Diagnosis not present

## 2023-06-06 DIAGNOSIS — M9905 Segmental and somatic dysfunction of pelvic region: Secondary | ICD-10-CM | POA: Diagnosis not present

## 2023-06-06 DIAGNOSIS — M9903 Segmental and somatic dysfunction of lumbar region: Secondary | ICD-10-CM | POA: Diagnosis not present

## 2023-06-07 NOTE — Telephone Encounter (Signed)
Unable to refill per protocol, Rx request is too soon. Last refill 04/07/23 for 90 and 4 refills.  Requested Prescriptions  Pending Prescriptions Disp Refills   metoprolol succinate (TOPROL-XL) 50 MG 24 hr tablet [Pharmacy Med Name: METOPROLOL SUCCINATE ER TABS 50MG ] 90 tablet 3    Sig: TAKE 1 TABLET DAILY.  TAKE WITH OR IMMEDIATELY FOLLOWING A MEAL     Cardiovascular:  Beta Blockers Passed - 06/05/2023 10:02 PM      Passed - Last BP in normal range    BP Readings from Last 1 Encounters:  04/07/23 110/75         Passed - Last Heart Rate in normal range    Pulse Readings from Last 1 Encounters:  04/07/23 (!) 58         Passed - Valid encounter within last 6 months    Recent Outpatient Visits           2 months ago Essential hypertension   [place] [facility] [place], [place] T, NP   6 months ago Acute non-recurrent frontal sinusitis   [place] [facility] [place], Unity T, NP   6 months ago Essential hypertension   [place] [facility] [place], Orason T, NP   7 months ago Essential hypertension   [place] [facility] Mecum, Oswaldo Conroy, PA-C   8 months ago Need for influenza vaccination   [place] [facility] Gabriel Cirri, NP       Future Appointments             In 1 month Cannady, Dorie Rank, NP [facility]

## 2023-06-16 ENCOUNTER — Telehealth: Payer: Self-pay | Admitting: Nurse Practitioner

## 2023-06-16 NOTE — Telephone Encounter (Signed)
Patient came into the office to request a refill for his Metoprolol 50mg  (3 month supply) to be sent to [facility] in [place]. Informed patient a request will be sent to provider Aura Dials, NP and to please allow 48-72 hours for provider to respond.

## 2023-06-16 NOTE — Telephone Encounter (Signed)
Called pharmacy because according to chart, medication should be at the pharmacy. Pharmacy states that they do that the prescription and it is ready to be picked up.   Called and notified patient that his medication is at the pharmacy and ready to be picked up.

## 2023-06-17 ENCOUNTER — Encounter: Payer: Self-pay | Admitting: Nurse Practitioner

## 2023-06-21 ENCOUNTER — Encounter: Payer: Self-pay | Admitting: Nurse Practitioner

## 2023-06-21 MED ORDER — AMLODIPINE-VALSARTAN-HCTZ 10-160-12.5 MG PO TABS: 1.0000 | ORAL_TABLET | Freq: Every day | ORAL | 4 refills | Status: AC

## 2023-06-27 DIAGNOSIS — M5416 Radiculopathy, lumbar region: Secondary | ICD-10-CM | POA: Diagnosis not present

## 2023-06-27 DIAGNOSIS — M5431 Sciatica, right side: Secondary | ICD-10-CM | POA: Diagnosis not present

## 2023-06-27 DIAGNOSIS — M9905 Segmental and somatic dysfunction of pelvic region: Secondary | ICD-10-CM | POA: Diagnosis not present

## 2023-06-27 DIAGNOSIS — M9903 Segmental and somatic dysfunction of lumbar region: Secondary | ICD-10-CM | POA: Diagnosis not present

## 2023-07-10 NOTE — Patient Instructions (Signed)
Prediabetes Eating Plan Prediabetes is a condition that causes blood sugar (glucose) levels to be higher than normal. This increases the risk for developing type 2 diabetes (type 2 diabetes mellitus). Working with a health care provider or nutrition specialist (dietitian) to make diet and lifestyle changes can help prevent the onset of diabetes. These changes may help you: Control your blood glucose levels. Improve your cholesterol levels. Manage your blood pressure. What are tips for following this plan? Reading food labels Read food labels to check the amount of fat, salt (sodium), and sugar in prepackaged foods. Avoid foods that have: Saturated fats. Trans fats. Added sugars. Avoid foods that have more than 300 milligrams (mg) of sodium per serving. Limit your sodium intake to less than 2,300 mg each day. Shopping Avoid buying pre-made and processed foods. Avoid buying drinks with added sugar. Cooking Cook with olive oil. Do not use butter, lard, or ghee. Bake, broil, grill, steam, or boil foods. Avoid frying. Meal planning  Work with your dietitian to create an eating plan that is right for you. This may include tracking how many calories you take in each day. Use a food diary, notebook, or mobile application to track what you eat at each meal. Consider following a Mediterranean diet. This includes: Eating several servings of fresh fruits and vegetables each day. Eating fish at least twice a week. Eating one serving each day of whole grains, beans, nuts, and seeds. Using olive oil instead of other fats. Limiting alcohol. Limiting red meat. Using nonfat or low-fat dairy products. Consider following a plant-based diet. This includes dietary choices that focus on eating mostly vegetables and fruit, grains, beans, nuts, and seeds. If you have high blood pressure, you may need to limit your sodium intake or follow a diet such as the DASH (Dietary Approaches to Stop Hypertension) eating  plan. The DASH diet aims to lower high blood pressure. Lifestyle Set weight loss goals with help from your health care team. It is recommended that most people with prediabetes lose 7% of their body weight. Exercise for at least 30 minutes 5 or more days a week. Attend a support group or seek support from a mental health counselor. Take over-the-counter and prescription medicines only as told by your health care provider. What foods are recommended? Fruits Berries. Bananas. Apples. Oranges. Grapes. Papaya. Mango. Pomegranate. Kiwi. Grapefruit. Cherries. Vegetables Lettuce. Spinach. Peas. Beets. Cauliflower. Cabbage. Broccoli. Carrots. Tomatoes. Squash. Eggplant. Herbs. Peppers. Onions. Cucumbers. Brussels sprouts. Grains Whole grains, such as whole-wheat or whole-grain breads, crackers, cereals, and pasta. Unsweetened oatmeal. Bulgur. Barley. Quinoa. Brown rice. Corn or whole-wheat flour tortillas or taco shells. Meats and other proteins Seafood. Poultry without skin. Lean cuts of pork and beef. Tofu. Eggs. Nuts. Beans. Dairy Low-fat or fat-free dairy products, such as yogurt, cottage cheese, and cheese. Beverages Water. Tea. Coffee. Sugar-free or diet soda. Seltzer water. Low-fat or nonfat milk. Milk alternatives, such as soy or almond milk. Fats and oils Olive oil. Canola oil. Sunflower oil. Grapeseed oil. Avocado. Walnuts. Sweets and desserts Sugar-free or low-fat pudding. Sugar-free or low-fat ice cream and other frozen treats. Seasonings and condiments Herbs. Sodium-free spices. Mustard. Relish. Low-salt, low-sugar ketchup. Low-salt, low-sugar barbecue sauce. Low-fat or fat-free mayonnaise. The items listed above may not be a complete list of recommended foods and beverages. Contact a dietitian for more information. What foods are not recommended? Fruits Fruits canned with syrup. Vegetables Canned vegetables. Frozen vegetables with butter or cream sauce. Grains Refined white  flour and flour   products, such as bread, pasta, snack foods, and cereals. Meats and other proteins Fatty cuts of meat. Poultry with skin. Breaded or fried meat. Processed meats. Dairy Full-fat yogurt, cheese, or milk. Beverages Sweetened drinks, such as iced tea and soda. Fats and oils Butter. Lard. Ghee. Sweets and desserts Baked goods, such as cake, cupcakes, pastries, cookies, and cheesecake. Seasonings and condiments Spice mixes with added salt. Ketchup. Barbecue sauce. Mayonnaise. The items listed above may not be a complete list of foods and beverages that are not recommended. Contact a dietitian for more information. Where to find more information American Diabetes Association: www.diabetes.org Summary You may need to make diet and lifestyle changes to help prevent the onset of diabetes. These changes can help you control blood sugar, improve cholesterol levels, and manage blood pressure. Set weight loss goals with help from your health care team. It is recommended that most people with prediabetes lose 7% of their body weight. Consider following a Mediterranean diet. This includes eating plenty of fresh fruits and vegetables, whole grains, beans, nuts, seeds, fish, and low-fat dairy, and using olive oil instead of other fats. This information is not intended to replace advice given to you by your health care provider. Make sure you discuss any questions you have with your health care provider. Document Revised: 01/24/2020 Document Reviewed: 01/24/2020 Elsevier Patient Education  2024 Elsevier Inc.  

## 2023-07-13 ENCOUNTER — Encounter: Payer: Self-pay | Admitting: Nurse Practitioner

## 2023-07-13 ENCOUNTER — Ambulatory Visit: Payer: BC Managed Care – PPO | Admitting: Nurse Practitioner

## 2023-07-13 VITALS — BP 118/75 | HR 69 | Temp 98.0°F | Ht 67.0 in | Wt 202.6 lb

## 2023-07-13 DIAGNOSIS — R7303 Prediabetes: Secondary | ICD-10-CM | POA: Diagnosis not present

## 2023-07-13 DIAGNOSIS — Z23 Encounter for immunization: Secondary | ICD-10-CM

## 2023-07-13 DIAGNOSIS — E669 Obesity, unspecified: Secondary | ICD-10-CM | POA: Diagnosis not present

## 2023-07-13 LAB — BAYER DCA HB A1C WAIVED: HB A1C (BAYER DCA - WAIVED): 5.8 % — ABNORMAL HIGH (ref 4.8–5.6)

## 2023-07-13 NOTE — Progress Notes (Signed)
BP 118/75   Pulse 69   Temp 98 F (36.7 C) (Oral)   Ht 5\' 7"  (1.702 m)   Wt 202 lb 9.6 oz (91.9 kg)   SpO2 98%   BMI 31.73 kg/m    Subjective:    Patient ID: Craig Ryan, male    DOB: 1961-01-22, 62 y.o.   MRN: [id number]  HPI: Craig Ryan is a 62 y.o. male  Chief Complaint  Patient presents with   Prediabetes   Impaired Fasting Glucose Presents today for follow-up on A1c, which had trended up to 6.5% May 2024. We discussed need to focus on diet heavily at the time and if ongoing elevation in this range or higher we will consider him diabetic.  Has been working on diet and exercise with 10 lbs lost thus far.  Was in [place] last week and walked 7 miles a day. HbA1C:  Lab Results  Component Value Date   HGBA1C 6.5 (H) 04/07/2023  Duration of elevated blood sugar:  Polydipsia: no Polyuria:  occasional at night Weight change: loss per above Visual disturbance: no Glucose Monitoring: no    Accucheck frequency: Not Checking    Fasting glucose:     Post prandial:  Diabetic Education: Not Completed Family history of diabetes: yes  -- father had diabetes and a brother who had this (both have passed)  Relevant past medical, surgical, family and social history reviewed and updated as indicated. Interim medical history since our last visit reviewed. Allergies and medications reviewed and updated.  Review of Systems  Constitutional:  Negative for activity change, diaphoresis, fatigue and fever.  Respiratory:  Negative for cough, chest tightness, shortness of breath and wheezing.   Cardiovascular:  Negative for chest pain, palpitations and leg swelling.  Gastrointestinal: Negative.   Endocrine: Positive for polyuria (occasional at night). Negative for cold intolerance, heat intolerance, polydipsia and polyphagia.  Neurological: Negative.   Psychiatric/Behavioral: Negative.      Per HPI unless specifically indicated above     Objective:    BP 118/75   Pulse  69   Temp 98 F (36.7 C) (Oral)   Ht 5\' 7"  (1.702 m)   Wt 202 lb 9.6 oz (91.9 kg)   SpO2 98%   BMI 31.73 kg/m   Wt Readings from Last 3 Encounters:  07/13/23 202 lb 9.6 oz (91.9 kg)  04/07/23 208 lb 12.8 oz (94.7 kg)  12/03/22 209 lb 8 oz (95 kg)    Physical Exam Vitals and nursing note reviewed.  Constitutional:      General: He is awake. He is not in acute distress.    Appearance: He is well-developed and well-groomed. He is obese. He is not ill-appearing or toxic-appearing.  HENT:     Head: Normocephalic.     Right Ear: Hearing and external ear normal.     Left Ear: Hearing and external ear normal.  Eyes:     General: Lids are normal.     Extraocular Movements: Extraocular movements intact.     Conjunctiva/sclera: Conjunctivae normal.  Neck:     Thyroid: No thyromegaly.     Vascular: No carotid bruit.  Cardiovascular:     Rate and Rhythm: Normal rate and regular rhythm.     Heart sounds: Normal heart sounds. No murmur heard.    No gallop.  Pulmonary:     Effort: Pulmonary effort is normal. No accessory muscle usage or respiratory distress.     Breath sounds: Normal breath sounds. No decreased  breath sounds, wheezing or rhonchi.  Abdominal:     General: Bowel sounds are normal. There is no distension.     Palpations: Abdomen is soft.     Tenderness: There is no abdominal tenderness.  Musculoskeletal:     Cervical back: Full passive range of motion without pain.     Right lower leg: No edema.     Left lower leg: No edema.  Lymphadenopathy:     Cervical: No cervical adenopathy.  Skin:    General: Skin is warm.     Capillary Refill: Capillary refill takes less than 2 seconds.  Neurological:     Mental Status: He is alert and oriented to person, place, and time.     Deep Tendon Reflexes: Reflexes are normal and symmetric.     Reflex Scores:      Brachioradialis reflexes are 2+ on the right side and 2+ on the left side.      Patellar reflexes are 2+ on the right  side and 2+ on the left side. Psychiatric:        Attention and Perception: Attention normal.        Mood and Affect: Mood normal.        Speech: Speech normal.        Behavior: Behavior normal. Behavior is cooperative.        Thought Content: Thought content normal.     Results for orders placed or performed in visit on 06/17/23  HM DIABETES EYE EXAM  Result Value Ref Range   HM Diabetic Eye Exam No Retinopathy No Retinopathy      Assessment & Plan:   Problem List Items Addressed This Visit       Other   Obesity (BMI 30-39.9)    BMI 31.73, with 10 pounds of loss since last visit.  Praised for this.  Recommended eating smaller high protein, low fat meals more frequently and exercising 30 mins a day 5 times a week with a goal of 10-15lb weight loss in the next 3 months. Patient voiced their understanding and motivation to adhere to these recommendations.       Prediabetes - Primary    A1c trend down today from 6.5% in May to 5.8% today -- will maintain prediabetes diagnosis.  He has lost 10 pounds over past months and is working on diet and exercise, recommend he continue this focus and praised for his hard work.  Return to office in November for physical.      Relevant Orders   Bayer DCA Hb A1c Waived   Other Visit Diagnoses     Flu vaccine need       Flu vaccine provided to patient today, educated him on this.   Relevant Orders   Flu vaccine trivalent PF, 6mos and older(Flulaval,Afluria,Fluarix,Fluzone) (Completed)        Follow up plan: Return in about 3 months (around 10/07/2023) for ANNUAL PHYSICAL after 10/06/23.

## 2023-07-13 NOTE — Assessment & Plan Note (Signed)
A1c trend down today from 6.5% in May to 5.8% today -- will maintain prediabetes diagnosis.  He has lost 10 pounds over past months and is working on diet and exercise, recommend he continue this focus and praised for his hard work.  Return to office in November for physical.

## 2023-07-13 NOTE — Assessment & Plan Note (Signed)
BMI 31.73, with 10 pounds of loss since last visit.  Praised for this.  Recommended eating smaller high protein, low fat meals more frequently and exercising 30 mins a day 5 times a week with a goal of 10-15lb weight loss in the next 3 months. Patient voiced their understanding and motivation to adhere to these recommendations.

## 2023-07-25 DIAGNOSIS — M9903 Segmental and somatic dysfunction of lumbar region: Secondary | ICD-10-CM | POA: Diagnosis not present

## 2023-07-25 DIAGNOSIS — M5416 Radiculopathy, lumbar region: Secondary | ICD-10-CM | POA: Diagnosis not present

## 2023-07-25 DIAGNOSIS — M5431 Sciatica, right side: Secondary | ICD-10-CM | POA: Diagnosis not present

## 2023-07-25 DIAGNOSIS — M9905 Segmental and somatic dysfunction of pelvic region: Secondary | ICD-10-CM | POA: Diagnosis not present

## 2023-08-09 ENCOUNTER — Encounter: Payer: Self-pay | Admitting: Nurse Practitioner

## 2023-08-22 DIAGNOSIS — M9905 Segmental and somatic dysfunction of pelvic region: Secondary | ICD-10-CM | POA: Diagnosis not present

## 2023-08-22 DIAGNOSIS — M5416 Radiculopathy, lumbar region: Secondary | ICD-10-CM | POA: Diagnosis not present

## 2023-08-22 DIAGNOSIS — M9903 Segmental and somatic dysfunction of lumbar region: Secondary | ICD-10-CM | POA: Diagnosis not present

## 2023-08-22 DIAGNOSIS — M5431 Sciatica, right side: Secondary | ICD-10-CM | POA: Diagnosis not present

## 2023-09-19 ENCOUNTER — Other Ambulatory Visit: Payer: Self-pay

## 2023-09-19 DIAGNOSIS — E782 Mixed hyperlipidemia: Secondary | ICD-10-CM

## 2023-09-19 DIAGNOSIS — M5416 Radiculopathy, lumbar region: Secondary | ICD-10-CM | POA: Diagnosis not present

## 2023-09-19 DIAGNOSIS — M9903 Segmental and somatic dysfunction of lumbar region: Secondary | ICD-10-CM | POA: Diagnosis not present

## 2023-09-19 DIAGNOSIS — M5431 Sciatica, right side: Secondary | ICD-10-CM | POA: Diagnosis not present

## 2023-09-19 DIAGNOSIS — M9905 Segmental and somatic dysfunction of pelvic region: Secondary | ICD-10-CM | POA: Diagnosis not present

## 2023-09-19 MED ORDER — ATORVASTATIN CALCIUM 40 MG PO TABS: ORAL_TABLET | ORAL | 4 refills | Status: AC

## 2023-09-19 NOTE — Telephone Encounter (Signed)
Patient needs medication sent to mail order pharmacy.

## 2023-10-15 NOTE — Patient Instructions (Signed)
Be Involved in Caring For Your Health:  Taking Medications When medications are taken as directed, they can greatly improve your health. But if they are not taken as prescribed, they may not work. In some cases, not taking them correctly can be harmful. To help ensure your treatment remains effective and safe, understand your medications and how to take them. Bring your medications to each visit for review by your provider.  Your lab results, notes, and after visit summary will be available on My Chart. We strongly encourage you to use this feature. If lab results are abnormal the clinic will contact you with the appropriate steps. If the clinic does not contact you assume the results are satisfactory. You can always view your results on My Chart. If you have questions regarding your health or results, please contact the clinic during office hours. You can also ask questions on My Chart.  We at [facility] are grateful that you chose [place] to provide your care. We strive to provide evidence-based and compassionate care and are always looking for feedback. If you get a survey from the clinic please complete this so we can hear your opinions.  Healthy Eating, Adult Healthy eating may help you get and keep a healthy body weight, reduce the risk of chronic disease, and live a long and productive life. It is important to follow a healthy eating pattern. Your nutritional and calorie needs should be met mainly by different nutrient-rich foods. What are tips for following this plan? Reading food labels Read labels and choose the following: Reduced or low sodium products. Juices with 100% fruit juice. Foods with low saturated fats (<3 g per serving) and high polyunsaturated and monounsaturated fats. Foods with whole grains, such as whole wheat, cracked wheat, brown rice, and wild rice. Whole grains that are fortified with folic acid. This is recommended for females who are pregnant or who want to  become pregnant. Read labels and do not eat or drink the following: Foods or drinks with added sugars. These include foods that contain brown sugar, corn sweetener, corn syrup, dextrose, fructose, glucose, high-fructose corn syrup, honey, invert sugar, lactose, malt syrup, maltose, molasses, raw sugar, sucrose, trehalose, or turbinado sugar. Limit your intake of added sugars to less than 10% of your total daily calories. Do not eat more than the following amounts of added sugar per day: 6 teaspoons (25 g) for females. 9 teaspoons (38 g) for males. Foods that contain processed or refined starches and grains. Refined grain products, such as white flour, degermed cornmeal, white bread, and white rice. Shopping Choose nutrient-rich snacks, such as vegetables, whole fruits, and nuts. Avoid high-calorie and high-sugar snacks, such as potato chips, fruit snacks, and candy. Use oil-based dressings and spreads on foods instead of solid fats such as butter, margarine, sour cream, or cream cheese. Limit pre-made sauces, mixes, and "instant" products such as flavored rice, instant noodles, and ready-made pasta. Try more plant-protein sources, such as tofu, tempeh, black beans, edamame, lentils, nuts, and seeds. Explore eating plans such as the Mediterranean diet or vegetarian diet. Try heart-healthy dips made with beans and healthy fats like hummus and guacamole. Vegetables go great with these. Cooking Use oil to saut or stir-fry foods instead of solid fats such as butter, margarine, or lard. Try baking, boiling, grilling, or broiling instead of frying. Remove the fatty part of meats before cooking. Steam vegetables in water or broth. Meal planning  At meals, imagine dividing your plate into fourths: One-half of  your plate is fruits and vegetables. One-fourth of your plate is whole grains. One-fourth of your plate is protein, especially lean meats, poultry, eggs, tofu, beans, or nuts. Include low-fat  dairy as part of your daily diet. Lifestyle Choose healthy options in all settings, including home, work, school, restaurants, or stores. Prepare your food safely: Wash your hands after handling raw meats. Where you prepare food, keep surfaces clean by regularly washing with hot, soapy water. Keep raw meats separate from ready-to-eat foods, such as fruits and vegetables. Cook seafood, meat, poultry, and eggs to the recommended temperature. Get a food thermometer. Store foods at safe temperatures. In general: Keep cold foods at 48F (4.4C) or below. Keep hot foods at 148F (60C) or above. Keep your freezer at [facility] (-17.8C) or below. Foods are not safe to eat if they have been between the temperatures of 40-148F (4.4-60C) for more than 2 hours. What foods should I eat? Fruits Aim to eat 1-2 cups of fresh, canned (in natural juice), or frozen fruits each day. One cup of fruit equals 1 small apple, 1 large banana, 8 large strawberries, 1 cup (237 g) canned fruit,  cup (82 g) dried fruit, or 1 cup (240 mL) 100% juice. Vegetables Aim to eat 2-4 cups of fresh and frozen vegetables each day, including different varieties and colors. One cup of vegetables equals 1 cup (91 g) broccoli or cauliflower florets, 2 medium carrots, 2 cups (150 g) raw, leafy greens, 1 large tomato, 1 large bell pepper, 1 large sweet potato, or 1 medium white potato. Grains Aim to eat 5-10 ounce-equivalents of whole grains each day. Examples of 1 ounce-equivalent of grains include 1 slice of bread, 1 cup (40 g) ready-to-eat cereal, 3 cups (24 g) popcorn, or  cup (93 g) cooked rice. Meats and other proteins Try to eat 5-7 ounce-equivalents of protein each day. Examples of 1 ounce-equivalent of protein include 1 egg,  oz nuts (12 almonds, 24 pistachios, or 7 walnut halves), 1/4 cup (90 g) cooked beans, 6 tablespoons (90 g) hummus or 1 tablespoon (16 g) peanut butter. A cut of meat or fish that is the size of a deck of  cards is about 3-4 ounce-equivalents (85 g). Of the protein you eat each week, try to have at least 8 sounce (227 g) of seafood. This is about 2 servings per week. This includes salmon, trout, herring, sardines, and anchovies. Dairy Aim to eat 3 cup-equivalents of fat-free or low-fat dairy each day. Examples of 1 cup-equivalent of dairy include 1 cup (240 mL) milk, 8 ounces (250 g) yogurt, 1 ounces (44 g) natural cheese, or 1 cup (240 mL) fortified soy milk. Fats and oils Aim for about 5 teaspoons (21 g) of fats and oils per day. Choose monounsaturated fats, such as canola and olive oils, mayonnaise made with olive oil or avocado oil, avocados, peanut butter, and most nuts, or polyunsaturated fats, such as sunflower, corn, and soybean oils, walnuts, pine nuts, sesame seeds, sunflower seeds, and flaxseed. Beverages Aim for 6 eight-ounce glasses of water per day. Limit coffee to 3-5 eight-ounce cups per day. Limit caffeinated beverages that have added calories, such as soda and energy drinks. If you drink alcohol: Limit how much you have to: 0-1 drink a day if you are male. 0-2 drinks a day if you are male. Know how much alcohol is in your drink. In the [place] one drink is one 12 oz bottle of beer (355 mL), one 5 oz glass of wine (  148 mL), or one 1 oz glass of hard liquor (44 mL). Seasoning and other foods Try not to add too much salt to your food. Try using herbs and spices instead of salt. Try not to add sugar to food. This information is based on [place] nutrition guidelines. To learn more, visit [employer]. Exact amounts may vary. You may need different amounts. This information is not intended to replace advice given to you by your health care provider. Make sure you discuss any questions you have with your health care provider. Document Revised: 07/26/2022 Document Reviewed: 07/26/2022 Elsevier Patient Education  2024 [employer].

## 2023-10-17 ENCOUNTER — Encounter: Payer: Self-pay | Admitting: Nurse Practitioner

## 2023-10-17 ENCOUNTER — Ambulatory Visit (INDEPENDENT_AMBULATORY_CARE_PROVIDER_SITE_OTHER): Payer: BC Managed Care – PPO | Admitting: Nurse Practitioner

## 2023-10-17 VITALS — BP 109/69 | HR 62 | Temp 97.9°F | Ht 68.8 in | Wt 203.4 lb

## 2023-10-17 DIAGNOSIS — E782 Mixed hyperlipidemia: Secondary | ICD-10-CM

## 2023-10-17 DIAGNOSIS — R7303 Prediabetes: Secondary | ICD-10-CM

## 2023-10-17 DIAGNOSIS — M5431 Sciatica, right side: Secondary | ICD-10-CM | POA: Diagnosis not present

## 2023-10-17 DIAGNOSIS — M5416 Radiculopathy, lumbar region: Secondary | ICD-10-CM | POA: Diagnosis not present

## 2023-10-17 DIAGNOSIS — N401 Enlarged prostate with lower urinary tract symptoms: Secondary | ICD-10-CM | POA: Diagnosis not present

## 2023-10-17 DIAGNOSIS — Z0000 Encounter for general adult medical examination without abnormal findings: Secondary | ICD-10-CM

## 2023-10-17 DIAGNOSIS — R3912 Poor urinary stream: Secondary | ICD-10-CM | POA: Diagnosis not present

## 2023-10-17 DIAGNOSIS — G4733 Obstructive sleep apnea (adult) (pediatric): Secondary | ICD-10-CM | POA: Diagnosis not present

## 2023-10-17 DIAGNOSIS — E669 Obesity, unspecified: Secondary | ICD-10-CM

## 2023-10-17 DIAGNOSIS — N522 Drug-induced erectile dysfunction: Secondary | ICD-10-CM

## 2023-10-17 DIAGNOSIS — I10 Essential (primary) hypertension: Secondary | ICD-10-CM

## 2023-10-17 DIAGNOSIS — M9905 Segmental and somatic dysfunction of pelvic region: Secondary | ICD-10-CM | POA: Diagnosis not present

## 2023-10-17 DIAGNOSIS — M9903 Segmental and somatic dysfunction of lumbar region: Secondary | ICD-10-CM | POA: Diagnosis not present

## 2023-10-17 DIAGNOSIS — N529 Male erectile dysfunction, unspecified: Secondary | ICD-10-CM | POA: Insufficient documentation

## 2023-10-17 LAB — BAYER DCA HB A1C WAIVED: HB A1C (BAYER DCA - WAIVED): 5.6 % (ref 4.8–5.6)

## 2023-10-17 MED ORDER — TADALAFIL 5 MG PO TABS: 5.0000 mg | ORAL_TABLET | Freq: Every day | ORAL | 5 refills | Status: AC | PRN

## 2023-10-17 NOTE — Assessment & Plan Note (Signed)
BMI 30.21 -- his goal is to get to 200 and is almost there.  Praised for this.  Recommended eating smaller high protein, low fat meals more frequently and exercising 30 mins a day 5 times a week with a goal of 10-15lb weight loss in the next 3 months. Patient voiced their understanding and motivation to adhere to these recommendations.

## 2023-10-17 NOTE — Assessment & Plan Note (Signed)
Ongoing issue with difficulty maintaining.  Will send in low dose of Cialis to trial for this, appears to be covered in monthly supply.  Educated him on this medication.  Consider testosterone check next visit.

## 2023-10-17 NOTE — Assessment & Plan Note (Signed)
Chronic, compliant with CPAP regimen.  Praised for 100% use. 

## 2023-10-17 NOTE — Assessment & Plan Note (Addendum)
Chronic, stable.  BP well below goal today. He would like to reduce medications -- will start with slow reduction off Metoprolol, educated him on how to perform this reduction and reason for slow reduction.  If tolerates and BP stable will maintain off this, he will update provider via MyChart.  Continue on triple therapy pill.  Educated patient on medications.  Recommend he monitor BP at least a few mornings a week at home and document (he has app and is good about consistently monitoring).  DASH diet at home.  Labs today: CMP, CBC, TSH.  Urine ALB 07 Apr 2023, maintain ARB for kidney protection.

## 2023-10-17 NOTE — Assessment & Plan Note (Signed)
A1c trend down today from 5.8% to 5.6% today -- will maintain prediabetes diagnosis.  He continues working on diet and exercise, recommend he continue this focus and praised for his hard work. Praised for success at lowering level.

## 2023-10-17 NOTE — Assessment & Plan Note (Signed)
Chronic, stable.  Continue current medication regimen and adjust as needed.  Lipid panel today. 

## 2023-10-17 NOTE — Progress Notes (Signed)
BP 109/69   Pulse 62   Temp 97.9 F (36.6 C) (Oral)   Ht 5' 8.8" (1.748 m)   Wt 203 lb 6.4 oz (92.3 kg)   SpO2 98%   BMI 30.21 kg/m    Subjective:    Patient ID: Craig Ryan, male    DOB: 05/15/1961, 62 y.o.   MRN: [id number]  HPI: Craig Ryan is a 62 y.o. male presenting on 10/17/2023 for comprehensive medical examination. Current medical complaints include:none  He currently lives with: wife Interim Problems from his last visit: no  HYPERTENSION / HYPERLIPIDEMIA Takes Amlodipine-Valsartan-hydrochlorothiazide 10-160-12.5 MG daily, Metoprolol + Atorvastatin.  He would like to cut back on medication if possible.  Uses CPAP 100% of the time, last study was 5 years. Satisfied with current treatment? yes Duration of hypertension: chronic BP monitoring frequency: daily BP range: 120-130/70 range BP medication side effects: no Duration of hyperlipidemia: chronic Cholesterol medication side effects: no Cholesterol supplements: none Medication compliance: good compliance Aspirin: yes on occasion Recent stressors: no Recurrent headaches: no Visual changes: no Palpitations: no Dyspnea: no Chest pain: no Lower extremity edema: no Dizzy/lightheaded: no  The 10-year ASCVD risk score (Arnett DK, et al., 2019) is: 7%   Values used to calculate the score:     Age: 43 years     Sex: Male     Is Non-Hispanic African American: No     Diabetic: No     Tobacco smoker: No     Systolic Blood Pressure: 109 mmHg     Is BP treated: Yes     HDL Cholesterol: 43 mg/dL     Total Cholesterol: 131 mg/dL  PREDIABETES A4Z 6.6% in September. Has continued to work on diet and exercise. Polydipsia/polyuria: no Visual disturbance: no Chest pain: no Paresthesias: no  BPH Taking Flomax nightly.  History of acoustic wave therapy for ED.  Will get erection, but can not maintain this.   BPH status: stable Satisfied with current treatment?: yes Medication side effects: no Medication  compliance: good compliance Duration: chronic Nocturia: 2 times Urinary frequency: occasional Incomplete voiding: no Urgency: no Weak urinary stream: no Straining to start stream: no Dysuria: no Onset: gradual Severity: mild Alleviating factors: medication. Aggravating factors: unknown Treatments attempted: Flomax IPSS Questionnaire (AUA-7): 4 score Over the past month.   1)  How often have you had a sensation of not emptying your bladder completely after you finish urinating?  0 - Not at all  2)  How often have you had to urinate again less than two hours after you finished urinating? 1 - Less than 1 time in 5  3)  How often have you found you stopped and started again several times when you urinated?  0 - Not at all  4) How difficult have you found it to postpone urination?  0 - Not at all  5) How often have you had a weak urinary stream?  1 - Less than 1 time in 5  6) How often have you had to push or strain to begin urination?  0 - Not at all  7) How many times did you most typically get up to urinate from the time you went to bed until the time you got up in the morning?  2 - 2 times  Total score:  0-7 mildly symptomatic   8-19 moderately symptomatic   20-35 severely symptomatic    Functional Status Survey: Is the patient deaf or have difficulty hearing?: No Does the  patient have difficulty seeing, even when wearing glasses/contacts?: No Does the patient have difficulty concentrating, remembering, or making decisions?: No Does the patient have difficulty walking or climbing stairs?: No Does the patient have difficulty dressing or bathing?: No Does the patient have difficulty doing errands alone such as visiting a doctor's office or shopping?: No  FALL RISK:    10/17/2023    9:09 AM 07/13/2023    8:42 AM 04/07/2023    8:43 AM 12/03/2022    9:19 AM 11/19/2022    8:29 AM  Fall Risk   Falls in the past year? 0 0 0 0 0  Number falls in past yr: 0 0 0 0 0  Injury with Fall? 0 0  0 0 0  Risk for fall due to : No Fall Risks No Fall Risks No Fall Risks No Fall Risks No Fall Risks  Follow up Falls evaluation completed Falls evaluation completed Falls evaluation completed Falls evaluation completed Falls evaluation completed    Depression Screen    10/17/2023    9:09 AM 07/13/2023    8:42 AM 04/07/2023    8:43 AM 12/03/2022    9:19 AM 11/19/2022    8:29 AM  Depression screen PHQ 2/9  Decreased Interest 0 0 0 0 0  Down, Depressed, Hopeless 0 0 0 0 0  PHQ - 2 Score 0 0 0 0 0  Altered sleeping 0 0 0 0 0  Tired, decreased energy 0 0 0 0 0  Change in appetite 0 0 0 0 0  Feeling bad or failure about yourself  0 0 0 0 0  Trouble concentrating 0 0 0 0 0  Moving slowly or fidgety/restless 0 0 0 0 0  Suicidal thoughts 0 0 0 0 0  PHQ-9 Score 0 0 0 0 0  Difficult doing work/chores Not difficult at all Not difficult at all Not difficult at all Not difficult at all Not difficult at all      10/17/2023    9:09 AM 07/13/2023    8:42 AM 04/07/2023    8:43 AM 12/03/2022    9:19 AM  GAD 7 : Generalized Anxiety Score  Nervous, Anxious, on Edge 0 0 0 0  Control/stop worrying 0 0 0 0  Worry too much - different things 0 0 0 0  Trouble relaxing 0 0 0 0  Restless 0 0 0 0  Easily annoyed or irritable 0 0 0 0  Afraid - awful might happen 0 0 0 0  Total GAD 7 Score 0 0 0 0  Anxiety Difficulty Not difficult at all Not difficult at all Not difficult at all Not difficult at all   Advanced Directives Does patient have a HCPOA?    yes If yes, name and contact information: wife Does patient have a living will or MOST form?  yes  Past Medical History:  Past Medical History:  Diagnosis Date   Allergy    History of shingles    Hyperlipidemia    Hyperlipidemia    Hypertension    Nephrolithiasis    OSA on CPAP     Surgical History:  Past Surgical History:  Procedure Laterality Date   COLONOSCOPY     COLONOSCOPY WITH PROPOFOL N/A 04/22/2021   Procedure: COLONOSCOPY WITH PROPOFOL;   Surgeon: Earline Mayotte, MD;  Location: [facility];  Service: Endoscopy;  Laterality: N/A;   EXTRACORPOREAL SHOCK WAVE LITHOTRIPSY Left 10/16/2015   Procedure: EXTRACORPOREAL SHOCK WAVE LITHOTRIPSY (ESWL);  Surgeon: Suszanne Conners  Evelene Croon, MD;  Location: [facility];  Service: Urology;  Laterality: Left;   EXTRACORPOREAL SHOCK WAVE LITHOTRIPSY Left 10/30/2015   Procedure: EXTRACORPOREAL SHOCK WAVE LITHOTRIPSY (ESWL);  Surgeon: Orson Ape, MD;  Location: [facility];  Service: Urology;  Laterality: Left;   EXTRACORPOREAL SHOCK WAVE LITHOTRIPSY Left 09/20/2019   Procedure: EXTRACORPOREAL SHOCK WAVE LITHOTRIPSY (ESWL);  Surgeon: Orson Ape, MD;  Location: [facility];  Service: Urology;  Laterality: Left;   IRRIGATION AND DEBRIDEMENT SEBACEOUS CYST  09/16/2022   LITHOTRIPSY  Feb 2016, 2009    Medications:  Current Outpatient Medications on File Prior to Visit  Medication Sig   albuterol (VENTOLIN HFA) 108 (90 Base) MCG/ACT inhaler Inhale 1-2 puffs into the lungs every 6 (six) hours as needed.   amLODIPine-Valsartan-HCTZ 10-160-12.5 MG TABS Take 1 tablet by mouth daily.   aspirin EC 81 MG tablet Take 81 mg by mouth daily.   atorvastatin (LIPITOR) 40 MG tablet TAKE 1 TABLET DAILY AT 6 P.M.   azelastine (ASTELIN) 0.1 % nasal spray Place 1 spray into both nostrils 2 (two) times daily.   Cetirizine HCl (ZYRTEC PO) Take by mouth daily.   metoprolol succinate (TOPROL-XL) 50 MG 24 hr tablet Take 1 tablet (50 mg total) by mouth daily. Take with or immediately following a meal.   montelukast (SINGULAIR) 10 MG tablet TAKE 1 TABLET(10 MG) BY MOUTH AT BEDTIME   Multiple Vitamin (MULTIVITAMIN) capsule Take 1 capsule by mouth daily.   pyridoxine (B-6) 100 MG tablet Take 100 mg by mouth daily.   tamsulosin (FLOMAX) 0.4 MG CAPS capsule Take 1 capsule (0.4 mg total) by mouth daily.   VITAMIN D PO Take 1 each by mouth daily. Gummy   No current facility-administered medications on file prior to visit.     Allergies:  No Known Allergies  Social History:  Social History   Socioeconomic History   Marital status: Married    Spouse name: Not on file   Number of children: Not on file   Years of education: Not on file   Highest education level: GED or equivalent  Occupational History   Not on file  Tobacco Use   Smoking status: Never   Smokeless tobacco: Never  Vaping Use   Vaping status: Never Used  Substance and Sexual Activity   Alcohol use: No   Drug use: No   Sexual activity: Not Currently  Other Topics Concern   Not on file  Social History Narrative   Not on file   Social Determinants of Health   Financial Resource Strain: Low Risk  (04/06/2023)   Overall Financial Resource Strain (CARDIA)    Difficulty of Paying Living Expenses: Not very hard  Food Insecurity: No Food Insecurity (04/06/2023)   Hunger Vital Sign    Worried About Running Out of Food in the Last Year: Never true    Ran Out of Food in the Last Year: Never true  Transportation Needs: No Transportation Needs (04/06/2023)   PRAPARE - Administrator, Civil Service (Medical): No    Lack of Transportation (Non-Medical): No  Physical Activity: Insufficiently Active (04/06/2023)   Exercise Vital Sign    Days of Exercise per Week: 3 days    Minutes of Exercise per Session: 30 min  Stress: No Stress Concern Present (04/06/2023)   [employer] of Occupational Health - Occupational Stress Questionnaire    Feeling of Stress : Not at all  Social Connections: Socially Integrated (04/06/2023)   Social Connection and Isolation  Panel [NHANES]    Frequency of Communication with Friends and Family: More than three times a week    Frequency of Social Gatherings with Friends and Family: More than three times a week    Attends Religious Services: More than 4 times per year    Active Member of [employer] or Organizations: Yes    Attends Engineer, structural: More than 4 times per year    Marital Status:  Married  Catering manager Violence: Not on file   Social History   Tobacco Use  Smoking Status Never  Smokeless Tobacco Never   Social History   Substance and Sexual Activity  Alcohol Use No    Family History:  Family History  Problem Relation Age of Onset   Heart disease Mother    Hyperlipidemia Mother    Lung disease Mother    Heart disease Father    Hyperlipidemia Father    Heart disease Brother    Diabetes Brother     Past medical history, surgical history, medications, allergies, family history and social history reviewed with patient today and changes made to appropriate areas of the chart.   ROS All other ROS negative except what is listed above and in the HPI.      Objective:    BP 109/69   Pulse 62   Temp 97.9 F (36.6 C) (Oral)   Ht 5' 8.8" (1.748 m)   Wt 203 lb 6.4 oz (92.3 kg)   SpO2 98%   BMI 30.21 kg/m   Wt Readings from Last 3 Encounters:  10/17/23 203 lb 6.4 oz (92.3 kg)  07/13/23 202 lb 9.6 oz (91.9 kg)  04/07/23 208 lb 12.8 oz (94.7 kg)   Physical Exam Vitals and nursing note reviewed.  Constitutional:      General: He is awake. He is not in acute distress.    Appearance: Normal appearance. He is well-developed and well-groomed. He is obese. He is not ill-appearing or toxic-appearing.  HENT:     Head: Normocephalic and atraumatic.     Right Ear: Hearing, tympanic membrane, ear canal and external ear normal. No drainage.     Left Ear: Hearing, tympanic membrane, ear canal and external ear normal. No drainage.     Nose: Nose normal.     Mouth/Throat:     Pharynx: Uvula midline.  Eyes:     General: Lids are normal.        Right eye: No discharge.        Left eye: No discharge.     Extraocular Movements: Extraocular movements intact.     Conjunctiva/sclera: Conjunctivae normal.     Pupils: Pupils are equal, round, and reactive to light.     Visual Fields: Right eye visual fields normal and left eye visual fields normal.  Neck:      Thyroid: No thyromegaly.     Vascular: No carotid bruit or JVD.     Trachea: Trachea normal.  Cardiovascular:     Rate and Rhythm: Normal rate and regular rhythm.     Heart sounds: Normal heart sounds, S1 normal and S2 normal. No murmur heard.    No gallop.  Pulmonary:     Effort: Pulmonary effort is normal. No accessory muscle usage or respiratory distress.     Breath sounds: Normal breath sounds.  Abdominal:     General: Bowel sounds are normal.     Palpations: Abdomen is soft. There is no hepatomegaly or splenomegaly.     Tenderness: There  is no abdominal tenderness.  Musculoskeletal:        General: Normal range of motion.     Cervical back: Normal range of motion and neck supple.     Right lower leg: No edema.     Left lower leg: No edema.  Lymphadenopathy:     Head:     Right side of head: No submental, submandibular, tonsillar, preauricular or posterior auricular adenopathy.     Left side of head: No submental, submandibular, tonsillar, preauricular or posterior auricular adenopathy.     Cervical: No cervical adenopathy.  Skin:    General: Skin is warm and dry.     Capillary Refill: Capillary refill takes less than 2 seconds.     Findings: No rash.  Neurological:     Mental Status: He is alert and oriented to person, place, and time.     Gait: Gait is intact.     Deep Tendon Reflexes: Reflexes are normal and symmetric.     Reflex Scores:      Brachioradialis reflexes are 2+ on the right side and 2+ on the left side.      Patellar reflexes are 2+ on the right side and 2+ on the left side. Psychiatric:        Attention and Perception: Attention normal.        Mood and Affect: Mood normal.        Speech: Speech normal.        Behavior: Behavior normal. Behavior is cooperative.        Thought Content: Thought content normal.        Cognition and Memory: Cognition normal.     Results for orders placed or performed in visit on 10/17/23  Bayer DCA Hb A1c Waived  Result  Value Ref Range   HB A1C (BAYER DCA - WAIVED) 5.6 4.8 - 5.6 %      Assessment & Plan:   Problem List Items Addressed This Visit       Cardiovascular and Mediastinum   Essential hypertension - Primary    Chronic, stable.  BP well below goal today. He would like to reduce medications -- will start with slow reduction off Metoprolol, educated him on how to perform this reduction and reason for slow reduction.  If tolerates and BP stable will maintain off this, he will update provider via MyChart.  Continue on triple therapy pill.  Educated patient on medications.  Recommend he monitor BP at least a few mornings a week at home and document (he has app and is good about consistently monitoring).  DASH diet at home.  Labs today: CMP, CBC, TSH.  Urine ALB 07 Apr 2023, maintain ARB for kidney protection.         Relevant Medications   tadalafil (CIALIS) 5 MG tablet   Other Relevant Orders   CBC with Differential/Platelet   Comprehensive metabolic panel   TSH     Respiratory   OSA on CPAP    Chronic, compliant with CPAP regimen.  Praised for 100% use.        Genitourinary   Benign prostatic hyperplasia with weak urinary stream    Chronic, ongoing with mild symptoms.  AUA = 4.  Recommend continue Flomax as ordered. PSA annually.      Relevant Orders   PSA     Other   Erectile dysfunction    Ongoing issue with difficulty maintaining.  Will send in low dose of Cialis to trial for this, appears  to be covered in monthly supply.  Educated him on this medication.  Consider testosterone check next visit.      Hyperlipidemia    Chronic, stable.  Continue current medication regimen and adjust as needed.  Lipid panel today.      Relevant Medications   tadalafil (CIALIS) 5 MG tablet   Other Relevant Orders   Comprehensive metabolic panel   Lipid Panel w/o Chol/HDL Ratio   Obesity (BMI 30-39.9)    BMI 30.21 -- his goal is to get to 200 and is almost there.  Praised for this.  Recommended  eating smaller high protein, low fat meals more frequently and exercising 30 mins a day 5 times a week with a goal of 10-15lb weight loss in the next 3 months. Patient voiced their understanding and motivation to adhere to these recommendations.       Prediabetes    A1c trend down today from 5.8% to 5.6% today -- will maintain prediabetes diagnosis.  He continues working on diet and exercise, recommend he continue this focus and praised for his hard work. Praised for success at lowering level.      Relevant Orders   Bayer DCA Hb A1c Waived (Completed)   Other Visit Diagnoses     Encounter for annual physical exam       Annual physical today with labs and health maintenance reviewed, discussed with patient.       Discussed aspirin prophylaxis for myocardial infarction prevention and decision was made to continue ASA  LABORATORY TESTING:  Health maintenance labs ordered today as discussed above.   The natural history of prostate cancer and ongoing controversy regarding screening and potential treatment outcomes of prostate cancer has been discussed with the patient. The meaning of a false positive PSA and a false negative PSA has been discussed. He indicates understanding of the limitations of this screening test and wishes to proceed with screening PSA testing.   IMMUNIZATIONS:   - Tdap: Tetanus vaccination status reviewed: last tetanus booster within 10 years. - Influenza: Up to date - Pneumovax: Not applicable - Prevnar: Not applicable - Zostavax vaccine: Up to date  SCREENING: - Colonoscopy: Up to date  Discussed with patient purpose of the colonoscopy is to detect colon cancer at curable precancerous or early stages   - AAA Screening: Not applicable  -Hearing Test: Not applicable  -Spirometry: Not applicable   PATIENT COUNSELING:    Sexuality: Discussed sexually transmitted diseases, partner selection, use of condoms, avoidance of unintended pregnancy  and contraceptive  alternatives.   Advised to avoid cigarette smoking.  I discussed with the patient that most people either abstain from alcohol or drink within safe limits (<=14/week and <=4 drinks/occasion for males, <=7/weeks and <= 3 drinks/occasion for females) and that the risk for alcohol disorders and other health effects rises proportionally with the number of drinks per week and how often a drinker exceeds daily limits.  Discussed cessation/primary prevention of drug use and availability of treatment for abuse.   Diet: Encouraged to adjust caloric intake to maintain  or achieve ideal body weight, to reduce intake of dietary saturated fat and total fat, to limit sodium intake by avoiding high sodium foods and not adding table salt, and to maintain adequate dietary potassium and calcium preferably from fresh fruits, vegetables, and low-fat dairy products.    Stressed the importance of regular exercise  Injury prevention: Discussed safety belts, safety helmets, smoke detector, smoking near bedding or upholstery.   Dental health: Discussed  importance of regular tooth brushing, flossing, and dental visits.   Follow up plan: NEXT PREVENTATIVE PHYSICAL DUE IN 1 YEAR. Return in about 6 months (around 04/16/2024) for PREDIABETES, HTN/HLD.

## 2023-10-17 NOTE — Assessment & Plan Note (Signed)
Chronic, ongoing with mild symptoms.  AUA = 4.  Recommend continue Flomax as ordered. PSA annually.

## 2023-10-18 LAB — LIPID PANEL W/O CHOL/HDL RATIO

## 2023-10-18 NOTE — Progress Notes (Signed)
Contacted via MyChart   So far only blood count back which is good.  Waiting on remainder of labs:)

## 2023-10-20 LAB — COMPREHENSIVE METABOLIC PANEL
ALT: 22 IU/L (ref 0–44)
AST: 17 IU/L (ref 0–40)
Albumin: 4.3 g/dL (ref 3.9–4.9)
Alkaline Phosphatase: 90 IU/L (ref 44–121)
BUN/Creatinine Ratio: 15 (ref 10–24)
BUN: 18 mg/dL (ref 8–27)
Bilirubin Total: 0.4 mg/dL (ref 0.0–1.2)
CO2: 26 mmol/L (ref 20–29)
Calcium: 9.4 mg/dL (ref 8.6–10.2)
Chloride: 102 mmol/L (ref 96–106)
Creatinine, Ser: 1.2 mg/dL (ref 0.76–1.27)
Globulin, Total: 2.5 g/dL (ref 1.5–4.5)
Glucose: 98 mg/dL (ref 70–99)
Potassium: 3.8 mmol/L (ref 3.5–5.2)
Sodium: 144 mmol/L (ref 134–144)
Total Protein: 6.8 g/dL (ref 6.0–8.5)
eGFR: 68 mL/min/{1.73_m2} (ref >59)

## 2023-10-20 LAB — CBC WITH DIFFERENTIAL/PLATELET
Basophils Absolute: 0.1 10*3/uL (ref 0.0–0.2)
Basos: 1 %
EOS (ABSOLUTE): 0.2 10*3/uL (ref 0.0–0.4)
Eos: 2 %
Hematocrit: 48.6 % (ref 37.5–51.0)
Hemoglobin: 16 g/dL (ref 13.0–17.7)
Immature Grans (Abs): 0 10*3/uL (ref 0.0–0.1)
Immature Granulocytes: 0 %
Lymphocytes Absolute: 1.7 10*3/uL (ref 0.7–3.1)
Lymphs: 25 %
MCH: 29.9 pg (ref 26.6–33.0)
MCHC: 32.9 g/dL (ref 31.5–35.7)
MCV: 91 fL (ref 79–97)
Monocytes Absolute: 0.6 10*3/uL (ref 0.1–0.9)
Monocytes: 9 %
Neutrophils Absolute: 4.1 10*3/uL (ref 1.4–7.0)
Neutrophils: 63 %
Platelets: 227 10*3/uL (ref 150–450)
RBC: 5.35 x10E6/uL (ref 4.14–5.80)
RDW: 13.2 % (ref 11.6–15.4)
WBC: 6.7 10*3/uL (ref 3.4–10.8)

## 2023-10-20 LAB — LIPID PANEL W/O CHOL/HDL RATIO
Cholesterol, Total: 133 mg/dL (ref 100–199)
HDL: 48 mg/dL (ref >39)
LDL Chol Calc (NIH): 69 mg/dL (ref 0–99)
Triglycerides: 81 mg/dL (ref 0–149)
VLDL Cholesterol Cal: 16 mg/dL (ref 5–40)

## 2023-10-20 LAB — TSH: TSH: 0.958 u[IU]/mL (ref 0.450–4.500)

## 2023-10-20 LAB — PSA: Prostate Specific Ag, Serum: 2.5 ng/mL (ref 0.0–4.0)

## 2023-10-20 NOTE — Progress Notes (Signed)
Contacted via MyChart   Good morning Craig Ryan, more labs are returning.  The only one miss now is thyroid.  You may not see prostate or lipid panel on your MyChart, but I have results here. - Kidney function, creatinine and eGFR, remains normal, as is liver function, AST and ALT.  - Lipid panel shows stable LDL at 69.  Continue Atorvastatin. - PSA in normal range but has trended up a little from previous in 1's.  This one is 2.5.  We will monitor closely and I do recommend a prostate exam next visit to check on this.  Any questions? Keep being stellar!!  Thank you for allowing me to participate in your care.  I appreciate you. Kindest regards, Ashaki Frosch

## 2023-11-14 DIAGNOSIS — M9903 Segmental and somatic dysfunction of lumbar region: Secondary | ICD-10-CM | POA: Diagnosis not present

## 2023-11-14 DIAGNOSIS — M5431 Sciatica, right side: Secondary | ICD-10-CM | POA: Diagnosis not present

## 2023-11-14 DIAGNOSIS — M9905 Segmental and somatic dysfunction of pelvic region: Secondary | ICD-10-CM | POA: Diagnosis not present

## 2023-11-14 DIAGNOSIS — M5416 Radiculopathy, lumbar region: Secondary | ICD-10-CM | POA: Diagnosis not present

## 2023-12-12 DIAGNOSIS — M5416 Radiculopathy, lumbar region: Secondary | ICD-10-CM | POA: Diagnosis not present

## 2023-12-12 DIAGNOSIS — M9903 Segmental and somatic dysfunction of lumbar region: Secondary | ICD-10-CM | POA: Diagnosis not present

## 2023-12-12 DIAGNOSIS — M9905 Segmental and somatic dysfunction of pelvic region: Secondary | ICD-10-CM | POA: Diagnosis not present

## 2023-12-12 DIAGNOSIS — M5431 Sciatica, right side: Secondary | ICD-10-CM | POA: Diagnosis not present

## 2024-01-09 DIAGNOSIS — M9903 Segmental and somatic dysfunction of lumbar region: Secondary | ICD-10-CM | POA: Diagnosis not present

## 2024-01-09 DIAGNOSIS — M9905 Segmental and somatic dysfunction of pelvic region: Secondary | ICD-10-CM | POA: Diagnosis not present

## 2024-01-09 DIAGNOSIS — M5431 Sciatica, right side: Secondary | ICD-10-CM | POA: Diagnosis not present

## 2024-01-09 DIAGNOSIS — M5416 Radiculopathy, lumbar region: Secondary | ICD-10-CM | POA: Diagnosis not present

## 2024-02-06 DIAGNOSIS — M9905 Segmental and somatic dysfunction of pelvic region: Secondary | ICD-10-CM | POA: Diagnosis not present

## 2024-02-06 DIAGNOSIS — M9903 Segmental and somatic dysfunction of lumbar region: Secondary | ICD-10-CM | POA: Diagnosis not present

## 2024-02-06 DIAGNOSIS — M5416 Radiculopathy, lumbar region: Secondary | ICD-10-CM | POA: Diagnosis not present

## 2024-02-06 DIAGNOSIS — M5431 Sciatica, right side: Secondary | ICD-10-CM | POA: Diagnosis not present

## 2024-03-05 DIAGNOSIS — M5431 Sciatica, right side: Secondary | ICD-10-CM | POA: Diagnosis not present

## 2024-03-05 DIAGNOSIS — M9905 Segmental and somatic dysfunction of pelvic region: Secondary | ICD-10-CM | POA: Diagnosis not present

## 2024-03-05 DIAGNOSIS — M9903 Segmental and somatic dysfunction of lumbar region: Secondary | ICD-10-CM | POA: Diagnosis not present

## 2024-03-05 DIAGNOSIS — M5416 Radiculopathy, lumbar region: Secondary | ICD-10-CM | POA: Diagnosis not present

## 2024-03-10 NOTE — Patient Instructions (Signed)
 Be Involved in Caring For Your Health:  Taking Medications When medications are taken as directed, they can greatly improve your health. But if they are not taken as prescribed, they may not work. In some cases, not taking them correctly can be harmful. To help ensure your treatment remains effective and safe, understand your medications and how to take them. Bring your medications to each visit for review by your provider.  Your lab results, notes, and after visit summary will be available on My Chart. We strongly encourage you to use this feature. If lab results are abnormal the clinic will contact you with the appropriate steps. If the clinic does not contact you assume the results are satisfactory. You can always view your results on My Chart. If you have questions regarding your health or results, please contact the clinic during office hours. You can also ask questions on My Chart.  We at [facility] are grateful that you chose [place] to provide your care. We strive to provide evidence-based and compassionate care and are always looking for feedback. If you get a survey from the clinic please complete this so we can hear your opinions.  Heart-Healthy Eating Plan Many factors influence your heart health, including eating and exercise habits. Heart health is also called coronary health. Coronary risk increases with abnormal blood fat (lipid) levels. A heart-healthy eating plan includes limiting unhealthy fats, increasing healthy fats, limiting salt (sodium) intake, and making other diet and lifestyle changes. What is my plan? Your health care provider may recommend that: You limit your fat intake to _________% or less of your total calories each day. You limit your saturated fat intake to _________% or less of your total calories each day. You limit the amount of cholesterol in your diet to less than _________ mg per day. You limit the amount of sodium in your diet to less than _________  mg per day. What are tips for following this plan? Cooking Cook foods using methods other than frying. Baking, boiling, grilling, and broiling are all good options. Other ways to reduce fat include: Removing the skin from poultry. Removing all visible fats from meats. Steaming vegetables in water or broth. Meal planning  At meals, imagine dividing your plate into fourths: Fill one-half of your plate with vegetables and green salads. Fill one-fourth of your plate with whole grains. Fill one-fourth of your plate with lean protein foods. Eat 2-4 cups of vegetables per day. One cup of vegetables equals 1 cup (91 g) broccoli or cauliflower florets, 2 medium carrots, 1 large bell pepper, 1 large sweet potato, 1 large tomato, 1 medium white potato, 2 cups (150 g) raw leafy greens. Eat 1-2 cups of fruit per day. One cup of fruit equals 1 small apple, 1 large banana, 1 cup (237 g) mixed fruit, 1 large orange,  cup (82 g) dried fruit, 1 cup (240 mL) 100% fruit juice. Eat more foods that contain soluble fiber. Examples include apples, broccoli, carrots, beans, peas, and barley. Aim to get 25-30 g of fiber per day. Increase your consumption of legumes, nuts, and seeds to 4-5 servings per week. One serving of dried beans or legumes equals  cup (90 g) cooked, 1 serving of nuts is  oz (12 almonds, 24 pistachios, or 7 walnut halves), and 1 serving of seeds equals  oz (8 g). Fats Choose healthy fats more often. Choose monounsaturated and polyunsaturated fats, such as olive and canola oils, avocado oil, flaxseeds, walnuts, almonds, and seeds. Eat  more omega-3 fats. Choose salmon, mackerel, sardines, tuna, flaxseed oil, and ground flaxseeds. Aim to eat fish at least 2 times each week. Check food labels carefully to identify foods with trans fats or high amounts of saturated fat. Limit saturated fats. These are found in animal products, such as meats, butter, and cream. Plant sources of saturated fats  include palm oil, palm kernel oil, and coconut oil. Avoid foods with partially hydrogenated oils in them. These contain trans fats. Examples are stick margarine, some tub margarines, cookies, crackers, and other baked goods. Avoid fried foods. General information Eat more home-cooked food and less restaurant, buffet, and fast food. Limit or avoid alcohol. Limit foods that are high in added sugar and simple starches such as foods made using white refined flour (white breads, pastries, sweets). Lose weight if you are overweight. Losing just 5-10% of your body weight can help your overall health and prevent diseases such as diabetes and heart disease. Monitor your sodium intake, especially if you have high blood pressure. Talk with your health care provider about your sodium intake. Try to incorporate more vegetarian meals weekly. What foods should I eat? Fruits All fresh, canned (in natural juice), or frozen fruits. Vegetables Fresh or frozen vegetables (raw, steamed, roasted, or grilled). Green salads. Grains Most grains. Choose whole wheat and whole grains most of the time. Rice and pasta, including brown rice and pastas made with whole wheat. Meats and other proteins Lean, well-trimmed beef, veal, pork, and lamb. Chicken and [place] without skin. All fish and shellfish. Wild duck, rabbit, pheasant, and venison. Egg whites or low-cholesterol egg substitutes. Dried beans, peas, lentils, and tofu. Seeds and most nuts. Dairy Low-fat or nonfat cheeses, including ricotta and mozzarella. Skim or 1% milk (liquid, powdered, or evaporated). Buttermilk made with low-fat milk. Nonfat or low-fat yogurt. Fats and oils Non-hydrogenated (trans-free) margarines. Vegetable oils, including soybean, sesame, sunflower, olive, avocado, peanut, safflower, corn, canola, and cottonseed. Salad dressings or mayonnaise made with a vegetable oil. Beverages Water (mineral or sparkling). Coffee and tea. Unsweetened ice  tea. Diet beverages. Sweets and desserts Sherbet, gelatin, and fruit ice. Small amounts of dark chocolate. Limit all sweets and desserts. Seasonings and condiments All seasonings and condiments. The items listed above may not be a complete list of foods and beverages you can eat. Contact a dietitian for more options. What foods should I avoid? Fruits Canned fruit in heavy syrup. Fruit in cream or butter sauce. Fried fruit. Limit coconut. Vegetables Vegetables cooked in cheese, cream, or butter sauce. Fried vegetables. Grains Breads made with saturated or trans fats, oils, or whole milk. Croissants. Sweet rolls. Donuts. High-fat crackers, such as cheese crackers and chips. Meats and other proteins Fatty meats, such as hot dogs, ribs, sausage, bacon, rib-eye roast or steak. High-fat deli meats, such as salami and bologna. Caviar. Domestic duck and goose. Organ meats, such as liver. Dairy Cream, sour cream, cream cheese, and creamed cottage cheese. Whole-milk cheeses. Whole or 2% milk (liquid, evaporated, or condensed). Whole buttermilk. Cream sauce or high-fat cheese sauce. Whole-milk yogurt. Fats and oils Meat fat, or shortening. Cocoa butter, hydrogenated oils, palm oil, coconut oil, palm kernel oil. Solid fats and shortenings, including bacon fat, salt pork, lard, and butter. Nondairy cream substitutes. Salad dressings with cheese or sour cream. Beverages Regular sodas and any drinks with added sugar. Sweets and desserts Frosting. Pudding. Cookies. Cakes. Pies. Milk chocolate or white chocolate. Buttered syrups. Full-fat ice cream or ice cream drinks. The items listed above may  not be a complete list of foods and beverages to avoid. Contact a dietitian for more information. Summary Heart-healthy meal planning includes limiting unhealthy fats, increasing healthy fats, limiting salt (sodium) intake and making other diet and lifestyle changes. Lose weight if you are overweight. Losing just  5-10% of your body weight can help your overall health and prevent diseases such as diabetes and heart disease. Focus on eating a balance of foods, including fruits and vegetables, low-fat or nonfat dairy, lean protein, nuts and legumes, whole grains, and heart-healthy oils and fats. This information is not intended to replace advice given to you by your health care provider. Make sure you discuss any questions you have with your health care provider. Document Revised: 11/30/2021 Document Reviewed: 11/30/2021 Elsevier Patient Education  2024 [employer].

## 2024-03-13 ENCOUNTER — Encounter: Payer: Self-pay | Admitting: Nurse Practitioner

## 2024-03-13 ENCOUNTER — Ambulatory Visit: Admitting: Nurse Practitioner

## 2024-03-13 VITALS — BP 112/70 | HR 63 | Temp 98.0°F | Ht 67.2 in | Wt 205.0 lb

## 2024-03-13 DIAGNOSIS — E669 Obesity, unspecified: Secondary | ICD-10-CM

## 2024-03-13 DIAGNOSIS — I10 Essential (primary) hypertension: Secondary | ICD-10-CM

## 2024-03-13 DIAGNOSIS — E782 Mixed hyperlipidemia: Secondary | ICD-10-CM

## 2024-03-13 DIAGNOSIS — G4733 Obstructive sleep apnea (adult) (pediatric): Secondary | ICD-10-CM

## 2024-03-13 DIAGNOSIS — R5383 Other fatigue: Secondary | ICD-10-CM | POA: Diagnosis not present

## 2024-03-13 DIAGNOSIS — R7303 Prediabetes: Secondary | ICD-10-CM

## 2024-03-13 DIAGNOSIS — Z23 Encounter for immunization: Secondary | ICD-10-CM

## 2024-03-13 LAB — BAYER DCA HB A1C WAIVED: HB A1C (BAYER DCA - WAIVED): 5.8 % — ABNORMAL HIGH (ref 4.8–5.6)

## 2024-03-13 LAB — MICROALBUMIN, URINE WAIVED
Creatinine, Urine Waived: 100 mg/dL (ref 10–300)
Microalb, Ur Waived: 80 mg/L — ABNORMAL HIGH (ref 0–19)

## 2024-03-13 NOTE — Assessment & Plan Note (Signed)
 Chronic, stable.  Continue current medication regimen and adjust as needed.  Lipid panel today.

## 2024-03-13 NOTE — Assessment & Plan Note (Signed)
 Chronic, stable.  BP well below goal today. Continue on current regimen and consider further reduction in future if BP remains well below goal.  Educated patient on medications.  Recommend he monitor BP at least a few mornings a week at home and document (he has app and is good about consistently monitoring).  DASH diet at home.  Labs today: CMP.  Urine ALB 80 May 2025, maintain ARB for kidney protection.

## 2024-03-13 NOTE — Assessment & Plan Note (Signed)
 A1c mild trend up today from 5.6% to 5.8% today -- will maintain prediabetes diagnosis.  He continues working on diet and exercise, recommend he continue this focus and praised for his hard work.

## 2024-03-13 NOTE — Assessment & Plan Note (Signed)
 BMI 31.92 with slight gain over winter months -- his goal is to get to 200 labs and he plans to return to regular exercise now that weather is nice.  Praised for this.  Recommended eating smaller high protein, low fat meals more frequently and exercising 30 mins a day 5 times a week with a goal of 10-15lb weight loss in the next 3 months. Patient voiced their understanding and motivation to adhere to these recommendations.

## 2024-03-13 NOTE — Progress Notes (Signed)
 BP 112/70   Pulse 63   Temp 98 F (36.7 C) (Oral)   Ht 5' 7.2" (1.707 m)   Wt 205 lb (93 kg)   SpO2 98%   BMI 31.92 kg/m    Subjective:    Patient ID: Craig Ryan, male    DOB: Mar 13, 1961, 63 y.o.   MRN: [id number]  HPI: Craig Ryan is a 63 y.o. male  Chief Complaint  Patient presents with   Hyperlipidemia   Hypertension   HYPERTENSION / HYPERLIPIDEMIA Taking Amlodipine /Valsartan /HCTZ, Metoprolol , Atorvastatin , + ASA.  Uses CPAP 100% of the time.  Does endorse fatigue and would like testosterone check. Satisfied with current treatment? yes Duration of hypertension: chronic BP monitoring frequency: daily BP range: average per phone app 120-130/70 range BP medication side effects: no Duration of hyperlipidemia: chronic Cholesterol medication side effects: no Cholesterol supplements: none Medication compliance: good compliance Aspirin: yes on occasion Recent stressors: no Recurrent headaches: no Visual changes: no Palpitations: no Dyspnea: no Chest pain: no Lower extremity edema: occasional during daytime Dizzy/lightheaded: no   PREDIABETES A1c 5.6% in December with focus on diet changes and regular exercise, although not as much over winter due to not getting outside as much. Lab Results  Component Value Date   HGBA1C 5.6 10/17/2023  Duration of elevated blood sugar: months Polydipsia: no Polyuria: no Weight change: no Visual disturbance: no Glucose Monitoring: no    Accucheck frequency: Not Checking    Fasting glucose:     Post prandial:  Diabetic Education: Not Completed Family history of diabetes: yes, father had it and lived to 67   Relevant past medical, surgical, family and social history reviewed and updated as indicated. Interim medical history since our last visit reviewed. Allergies and medications reviewed and updated.  Review of Systems  Constitutional:  Negative for activity change, diaphoresis, fatigue and fever.  Respiratory:   Negative for cough, chest tightness, shortness of breath and wheezing.   Cardiovascular:  Negative for chest pain, palpitations and leg swelling.  Gastrointestinal: Negative.   Endocrine: Negative for cold intolerance, heat intolerance, polydipsia, polyphagia and polyuria.  Neurological: Negative.   Psychiatric/Behavioral: Negative.     Per HPI unless specifically indicated above     Objective:    BP 112/70   Pulse 63   Temp 98 F (36.7 C) (Oral)   Ht 5' 7.2" (1.707 m)   Wt 205 lb (93 kg)   SpO2 98%   BMI 31.92 kg/m   Wt Readings from Last 3 Encounters:  03/13/24 205 lb (93 kg)  10/17/23 203 lb 6.4 oz (92.3 kg)  07/13/23 202 lb 9.6 oz (91.9 kg)    Physical Exam Vitals and nursing note reviewed.  Constitutional:      General: He is awake. He is not in acute distress.    Appearance: He is well-developed and well-groomed. He is obese. He is not ill-appearing or toxic-appearing.  HENT:     Head: Normocephalic.     Right Ear: Hearing and external ear normal.     Left Ear: Hearing and external ear normal.  Eyes:     General: Lids are normal.     Extraocular Movements: Extraocular movements intact.     Conjunctiva/sclera: Conjunctivae normal.  Neck:     Thyroid : No thyromegaly.     Vascular: No carotid bruit.  Cardiovascular:     Rate and Rhythm: Normal rate and regular rhythm.     Heart sounds: Normal heart sounds. No murmur heard.  No gallop.  Pulmonary:     Effort: Pulmonary effort is normal. No accessory muscle usage or respiratory distress.     Breath sounds: Normal breath sounds. No decreased breath sounds, wheezing or rhonchi.  Abdominal:     General: Bowel sounds are normal. There is no distension.     Palpations: Abdomen is soft.     Tenderness: There is no abdominal tenderness.  Musculoskeletal:     Cervical back: Full passive range of motion without pain.     Right lower leg: No edema.     Left lower leg: No edema.  Lymphadenopathy:     Cervical: No  cervical adenopathy.  Skin:    General: Skin is warm.     Capillary Refill: Capillary refill takes less than 2 seconds.  Neurological:     Mental Status: He is alert and oriented to person, place, and time.     Deep Tendon Reflexes: Reflexes are normal and symmetric.     Reflex Scores:      Brachioradialis reflexes are 2+ on the right side and 2+ on the left side.      Patellar reflexes are 2+ on the right side and 2+ on the left side. Psychiatric:        Attention and Perception: Attention normal.        Mood and Affect: Mood normal.        Speech: Speech normal.        Behavior: Behavior normal. Behavior is cooperative.        Thought Content: Thought content normal.    Results for orders placed or performed in visit on 10/17/23  Bayer DCA Hb A1c Waived   Collection Time: 10/17/23  9:10 AM  Result Value Ref Range   HB A1C (BAYER DCA - WAIVED) 5.6 4.8 - 5.6 %  CBC with Differential/Platelet   Collection Time: 10/17/23  9:11 AM  Result Value Ref Range   WBC 6.7 3.4 - 10.8 x10E3/uL   RBC 5.35 4.14 - 5.80 x10E6/uL   Hemoglobin 16.0 13.0 - 17.7 g/dL   Hematocrit 27.2 53.6 - 51.0 %   MCV 91 79 - 97 fL   MCH 29.9 26.6 - 33.0 pg   MCHC 32.9 31.5 - 35.7 g/dL   RDW 64.4 03.4 - 74.2 %   Platelets 227 150 - 450 x10E3/uL   Neutrophils 63 Not Estab. %   Lymphs 25 Not Estab. %   Monocytes 9 Not Estab. %   Eos 2 Not Estab. %   Basos 1 Not Estab. %   Neutrophils Absolute 4.1 1.4 - 7.0 x10E3/uL   Lymphocytes Absolute 1.7 0.7 - 3.1 x10E3/uL   Monocytes Absolute 0.6 0.1 - 0.9 x10E3/uL   EOS (ABSOLUTE) 0.2 0.0 - 0.4 x10E3/uL   Basophils Absolute 0.1 0.0 - 0.2 x10E3/uL   Immature Granulocytes 0 Not Estab. %   Immature Grans (Abs) 0.0 0.0 - 0.1 x10E3/uL  Comprehensive metabolic panel   Collection Time: 10/17/23  9:11 AM  Result Value Ref Range   Glucose 98 70 - 99 mg/dL   BUN 18 8 - 27 mg/dL   Creatinine, Ser 5.95 0.76 - 1.27 mg/dL   eGFR 68 >63 OV/FIE/3.32   BUN/Creatinine Ratio 15  10 - 24   Sodium 144 134 - 144 mmol/L   Potassium 3.8 3.5 - 5.2 mmol/L   Chloride 102 96 - 106 mmol/L   CO2 26 20 - 29 mmol/L   Calcium  9.4 8.6 - 10.2 mg/dL  Total Protein 6.8 6.0 - 8.5 g/dL   Albumin 4.3 3.9 - 4.9 g/dL   Globulin, Total 2.5 1.5 - 4.5 g/dL   Bilirubin Total 0.4 0.0 - 1.2 mg/dL   Alkaline Phosphatase 90 44 - 121 IU/L   AST 17 0 - 40 IU/L   ALT 22 0 - 44 IU/L  Lipid Panel w/o Chol/HDL Ratio   Collection Time: 10/17/23  9:11 AM  Result Value Ref Range   Cholesterol, Total 133 100 - 199 mg/dL   Triglycerides 81 0 - 149 mg/dL   HDL 48 >16 mg/dL   VLDL Cholesterol Cal 16 5 - 40 mg/dL   LDL Chol Calc (NIH) 69 0 - 99 mg/dL  TSH   Collection Time: 10/17/23  9:11 AM  Result Value Ref Range   TSH 0.958 0.450 - 4.500 uIU/mL  PSA   Collection Time: 10/17/23  9:11 AM  Result Value Ref Range   Prostate Specific Ag, Serum 2.5 0.0 - 4.0 ng/mL      Assessment & Plan:   Problem List Items Addressed This Visit       Cardiovascular and Mediastinum   Essential hypertension - Primary   Chronic, stable.  BP well below goal today. Continue on current regimen and consider further reduction in future if BP remains well below goal.  Educated patient on medications.  Recommend he monitor BP at least a few mornings a week at home and document (he has app and is good about consistently monitoring).  DASH diet at home.  Labs today: CMP.  Urine ALB 80 May 2025, maintain ARB for kidney protection.         Relevant Orders   Microalbumin, Urine Waived   Comprehensive metabolic panel with GFR     Respiratory   OSA on CPAP   Chronic, compliant with CPAP regimen.  Praised for 100% use.        Other   Prediabetes   A1c mild trend up today from 5.6% to 5.8% today -- will maintain prediabetes diagnosis.  He continues working on diet and exercise, recommend he continue this focus and praised for his hard work.      Relevant Orders   Bayer DCA Hb A1c Waived   Microalbumin, Urine  Waived   Obesity (BMI 30-39.9)   BMI 31.92 with slight gain over winter months -- his goal is to get to 200 labs and he plans to return to regular exercise now that weather is nice.  Praised for this.  Recommended eating smaller high protein, low fat meals more frequently and exercising 30 mins a day 5 times a week with a goal of 10-15lb weight loss in the next 3 months. Patient voiced their understanding and motivation to adhere to these recommendations.       Hyperlipidemia   Chronic, stable.  Continue current medication regimen and adjust as needed.  Lipid panel today.      Relevant Orders   Comprehensive metabolic panel with GFR   Lipid Panel w/o Chol/HDL Ratio   Other Visit Diagnoses       Fatigue, unspecified type       Check testosterone today per request.   Relevant Orders   Testosterone, free, total([facility])        Follow up plan: Return if symptoms worsen or fail to improve, for is changing insurances and will need to establish with [facility].

## 2024-03-13 NOTE — Assessment & Plan Note (Signed)
Chronic, compliant with CPAP regimen.  Praised for 100% use. 

## 2024-03-14 ENCOUNTER — Encounter: Payer: Self-pay | Admitting: Nurse Practitioner

## 2024-03-14 NOTE — Progress Notes (Signed)
 Contacted via MyChart   Good afternoon Craig Ryan, your labs have returned and overall remain stable: - Kidney function, creatinine and eGFR, remains normal, as is liver function, AST and ALT.  - Lipid panel shows stable levels. - Total testosterone is above 300 which is good.  I would recommend having new PCP check this in morning once you are established, if remains above 300 then range is good.  Any questions? Keep being amazing!!  Thank you for allowing me to participate in your care.  I appreciate you. Kindest regards, Berk Pilot

## 2024-03-15 LAB — LIPID PANEL W/O CHOL/HDL RATIO
Cholesterol, Total: 141 mg/dL (ref 100–199)
HDL: 46 mg/dL (ref >39)
LDL Chol Calc (NIH): 75 mg/dL (ref 0–99)
Triglycerides: 108 mg/dL (ref 0–149)
VLDL Cholesterol Cal: 20 mg/dL (ref 5–40)

## 2024-03-15 LAB — COMPREHENSIVE METABOLIC PANEL WITH GFR
ALT: 25 IU/L (ref 0–44)
AST: 19 IU/L (ref 0–40)
Albumin: 4.4 g/dL (ref 3.9–4.9)
Alkaline Phosphatase: 97 IU/L (ref 44–121)
BUN/Creatinine Ratio: 18 (ref 10–24)
BUN: 20 mg/dL (ref 8–27)
Bilirubin Total: 0.5 mg/dL (ref 0.0–1.2)
CO2: 23 mmol/L (ref 20–29)
Calcium: 9.3 mg/dL (ref 8.6–10.2)
Chloride: 102 mmol/L (ref 96–106)
Creatinine, Ser: 1.1 mg/dL (ref 0.76–1.27)
Globulin, Total: 2.8 g/dL (ref 1.5–4.5)
Glucose: 104 mg/dL — ABNORMAL HIGH (ref 70–99)
Potassium: 3.8 mmol/L (ref 3.5–5.2)
Sodium: 143 mmol/L (ref 134–144)
Total Protein: 7.2 g/dL (ref 6.0–8.5)
eGFR: 76 mL/min/{1.73_m2} (ref >59)

## 2024-03-15 LAB — TESTOSTERONE, FREE, TOTAL, SHBG
Sex Hormone Binding: 26.4 nmol/L (ref 19.3–76.4)
Testosterone, Free: 9.5 pg/mL (ref 6.6–18.1)
Testosterone: 340 ng/dL (ref 264–916)

## 2024-03-29 DIAGNOSIS — H35073 Retinal telangiectasis, bilateral: Secondary | ICD-10-CM | POA: Diagnosis not present

## 2024-03-29 DIAGNOSIS — H2513 Age-related nuclear cataract, bilateral: Secondary | ICD-10-CM | POA: Diagnosis not present

## 2024-03-29 DIAGNOSIS — H3552 Pigmentary retinal dystrophy: Secondary | ICD-10-CM | POA: Diagnosis not present

## 2024-03-29 DIAGNOSIS — Z9889 Other specified postprocedural states: Secondary | ICD-10-CM | POA: Diagnosis not present

## 2024-04-04 DIAGNOSIS — M5416 Radiculopathy, lumbar region: Secondary | ICD-10-CM | POA: Diagnosis not present

## 2024-04-04 DIAGNOSIS — M9903 Segmental and somatic dysfunction of lumbar region: Secondary | ICD-10-CM | POA: Diagnosis not present

## 2024-04-04 DIAGNOSIS — M9905 Segmental and somatic dysfunction of pelvic region: Secondary | ICD-10-CM | POA: Diagnosis not present

## 2024-04-04 DIAGNOSIS — M5431 Sciatica, right side: Secondary | ICD-10-CM | POA: Diagnosis not present

## 2024-04-16 ENCOUNTER — Ambulatory Visit: Payer: Self-pay | Admitting: Nurse Practitioner

## 2024-05-08 ENCOUNTER — Other Ambulatory Visit: Payer: Self-pay | Admitting: Nurse Practitioner

## 2024-05-09 NOTE — Telephone Encounter (Signed)
 Requested Prescriptions  Pending Prescriptions Disp Refills   tamsulosin  (FLOMAX ) 0.4 MG CAPS capsule [Pharmacy Med Name: TAMSULOSIN  0.4MG  CAPSULES] 90 capsule 0    Sig: TAKE 1 CAPSULE(0.4 MG) BY MOUTH DAILY     Urology: Alpha-Adrenergic Blocker Passed - 05/09/2024  4:11 PM      Passed - PSA in normal range and within 360 days    Prostate Specific Ag, Serum  Date Value Ref Range Status  10/17/2023 2.5 0.0 - 4.0 ng/mL Final    Comment:    Roche ECLIA methodology. According to the American Urological Association, Serum PSA should decrease and remain at undetectable levels after radical prostatectomy. The AUA defines biochemical recurrence as an initial PSA value 0.2 ng/mL or greater followed by a subsequent confirmatory PSA value 0.2 ng/mL or greater. Values obtained with different assay methods or kits cannot be used interchangeably. Results cannot be interpreted as absolute evidence of the presence or absence of malignant disease.          Passed - Last BP in normal range    BP Readings from Last 1 Encounters:  03/13/24 112/70         Passed - Valid encounter within last 12 months    Recent Outpatient Visits           1 month ago Essential hypertension   [facility] [facility] [place], Melanie DASEN, NP

## 2024-05-28 ENCOUNTER — Other Ambulatory Visit: Payer: Self-pay | Admitting: Nurse Practitioner

## 2024-05-30 NOTE — Telephone Encounter (Signed)
 Requested Prescriptions  Pending Prescriptions Disp Refills   montelukast  (SINGULAIR ) 10 MG tablet [Pharmacy Med Name: MONTELUKAST  10MG  TABLETS] 90 tablet 0    Sig: TAKE 1 TABLET(10 MG) BY MOUTH AT BEDTIME     Pulmonology:  Leukotriene Inhibitors Passed - 05/30/2024 10:41 AM      Passed - Valid encounter within last 12 months    Recent Outpatient Visits           2 months ago Essential hypertension   [facility] [place], Melanie DASEN, NP

## 2024-08-20 ENCOUNTER — Other Ambulatory Visit: Payer: Self-pay | Admitting: Nurse Practitioner

## 2024-08-22 NOTE — Telephone Encounter (Signed)
 Requested Prescriptions  Pending Prescriptions Disp Refills   tamsulosin  (FLOMAX ) 0.4 MG CAPS capsule [Pharmacy Med Name: TAMSULOSIN  0.4MG  CAPSULES] 90 capsule 0    Sig: TAKE 1 CAPSULE(0.4 MG) BY MOUTH DAILY     Urology: Alpha-Adrenergic Blocker Passed - 08/22/2024 12:24 PM      Passed - PSA in normal range and within 360 days    Prostate Specific Ag, Serum  Date Value Ref Range Status  10/17/2023 2.5 0.0 - 4.0 ng/mL Final    Comment:    Roche ECLIA methodology. According to the American Urological Association, Serum PSA should decrease and remain at undetectable levels after radical prostatectomy. The AUA defines biochemical recurrence as an initial PSA value 0.2 ng/mL or greater followed by a subsequent confirmatory PSA value 0.2 ng/mL or greater. Values obtained with different assay methods or kits cannot be used interchangeably. Results cannot be interpreted as absolute evidence of the presence or absence of malignant disease.          Passed - Last BP in normal range    BP Readings from Last 1 Encounters:  03/13/24 112/70         Passed - Valid encounter within last 12 months    Recent Outpatient Visits           5 months ago Essential hypertension   [place] [facility] [place], Melanie DASEN, NP

## 2024-11-22 ENCOUNTER — Other Ambulatory Visit: Payer: Self-pay | Admitting: Nurse Practitioner
